# Patient Record
Sex: Male | Born: 1940 | Race: White | Hispanic: No | Marital: Married | Smoking: Former smoker
Health system: Southern US, Community
[De-identification: ages and names within clinical notes are randomized; demographics above are authoritative.]

## PROBLEM LIST (undated history)

## (undated) DIAGNOSIS — Z86711 Personal history of pulmonary embolism: Secondary | ICD-10-CM

## (undated) DIAGNOSIS — I10 Essential (primary) hypertension: Secondary | ICD-10-CM

## (undated) DIAGNOSIS — C649 Malignant neoplasm of unspecified kidney, except renal pelvis: Secondary | ICD-10-CM

## (undated) DIAGNOSIS — M1990 Unspecified osteoarthritis, unspecified site: Secondary | ICD-10-CM

## (undated) HISTORY — PX: APPENDECTOMY: SHX54

## (undated) HISTORY — DX: Unspecified osteoarthritis, unspecified site: M19.90

## (undated) HISTORY — PX: HERNIA REPAIR: SHX51

## (undated) HISTORY — PX: OTHER SURGICAL HISTORY: SHX169

---

## 1999-10-26 ENCOUNTER — Encounter: Payer: Self-pay | Admitting: General Surgery

## 1999-10-28 ENCOUNTER — Ambulatory Visit (HOSPITAL_COMMUNITY): Admission: RE | Admit: 1999-10-28 | Discharge: 1999-10-28 | Payer: Self-pay | Admitting: General Surgery

## 2004-07-31 ENCOUNTER — Observation Stay (HOSPITAL_COMMUNITY): Admission: AD | Admit: 2004-07-31 | Discharge: 2004-08-01 | Payer: Self-pay | Admitting: Surgery

## 2004-07-31 ENCOUNTER — Encounter: Payer: Self-pay | Admitting: Emergency Medicine

## 2004-08-02 ENCOUNTER — Ambulatory Visit (HOSPITAL_COMMUNITY): Admission: RE | Admit: 2004-08-02 | Discharge: 2004-08-02 | Payer: Self-pay | Admitting: Surgery

## 2004-08-05 ENCOUNTER — Ambulatory Visit (HOSPITAL_COMMUNITY): Admission: RE | Admit: 2004-08-05 | Discharge: 2004-08-06 | Payer: Self-pay | Admitting: Orthopaedic Surgery

## 2011-08-10 ENCOUNTER — Ambulatory Visit (INDEPENDENT_AMBULATORY_CARE_PROVIDER_SITE_OTHER): Payer: MEDICARE | Admitting: General Surgery

## 2011-08-10 ENCOUNTER — Encounter (INDEPENDENT_AMBULATORY_CARE_PROVIDER_SITE_OTHER): Payer: Self-pay | Admitting: General Surgery

## 2011-08-10 VITALS — BP 143/81 | HR 65 | Temp 98.0°F | Ht 69.0 in | Wt 198.6 lb

## 2011-08-10 DIAGNOSIS — K4090 Unilateral inguinal hernia, without obstruction or gangrene, not specified as recurrent: Secondary | ICD-10-CM | POA: Insufficient documentation

## 2011-08-10 NOTE — Patient Instructions (Signed)
Call us when you are on the lowest dose of Prednisone possible or you are off of it and then we can schedule your hernia surgery.

## 2011-08-10 NOTE — Progress Notes (Signed)
Patient ID: Devin [name], male   DOB: 1941/02/03, 71 y.o.   MRN: [id number]  Chief Complaint  Patient presents with  . Pre-op Exam    eval [facility]    HPI Devin [name] is a 71 y.o. male.   HPI  He is referred by Dr. Shaune Pollack for evaluation of a possible left inguinal hernia.  He noticed a left groin swelling about 6 months ago that has progressively increased and extended down into his scrotum. When he does heavy activities it is uncomfortable. He has no obstructing symptoms. No current difficulty with urination or constipation. I did a laparoscopic repair of a recurrent right inguinal hernia in 2001 and he had urinary retention after this. 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 No obstructive symptoms.  Past Medical History  Diagnosis Date  . Arthritis       Polymyalgia rheumatica, hypertension, gout, chronic kidney disease  Past Surgical History  Procedure Date  . Hernia repair 1985&2001  Appendectomy, right wrist surgery.  Family History  Problem Relation Age of Onset  . Cancer Mother     bone    Social History History  Substance Use Topics  . Smoking status: Former Games developer  . Smokeless tobacco: Former Neurosurgeon    Quit date: 08/09/1985  . Alcohol Use: No    Allergies  Allergen Reactions  . Zestril (Lisinopril)     gout     Current Outpatient Prescriptions  Medication Sig Dispense Refill  . allopurinol (ZYLOPRIM) 300 MG tablet       . amLODipine (NORVASC) 5 MG tablet       . furosemide (LASIX) 20 MG tablet       . HYDROcodone-acetaminophen (VICODIN) 5-500 MG per tablet       . labetalol (NORMODYNE) 300 MG tablet       . predniSONE (DELTASONE) 20 MG tablet         Review of Systems Review of Systems  Respiratory: Negative.   Cardiovascular: Negative.   Gastrointestinal: Negative.   Genitourinary: Negative for dysuria and difficulty urinating.  Musculoskeletal: Positive for arthralgias.  Neurological: Negative.     Hematological: Negative.     Blood pressure 143/81, pulse 65, temperature 98 F (36.7 C), temperature source Temporal, height 5\' 9"  (1.753 m), weight 198 lb 9.6 oz (90.084 kg), SpO2 98.00%.  Physical Exam Physical Exam  Constitutional:       Overweight male in NAD  HENT:  Head: Normocephalic and atraumatic.  Cardiovascular: Normal rate and regular rhythm.   Pulmonary/Chest: Effort normal and breath sounds normal.  Abdominal: Soft. He exhibits no mass. There is no tenderness.       RLQ scar.  Small subumbilical scars.  Genitourinary:       Right groin scar.  Large left inguinal bulge radiating down into scrotum that is partially reducible.    Data Reviewed Dr. Kevan Ny' note.  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    Plan    Open left inguinal hernia repair with mesh when his prednisone has been weaned down her off.  I have explained the procedure, risks, and aftercare of inguinal hernia repair.  Risks include but are not limited to bleeding, infection, wound problems, anesthesia, recurrence, bladder or intestine injury, urinary retention, testicular dysfunction, chronic pain, mesh problems.  He seems to understand and agrees with the plan.  Will await his phone call re his prednisone.  Irving Lubbers J 08/10/2011, 10:59 AM

## 2011-09-07 ENCOUNTER — Other Ambulatory Visit: Payer: Self-pay | Admitting: Rheumatology

## 2011-09-07 DIAGNOSIS — M064 Inflammatory polyarthropathy: Secondary | ICD-10-CM

## 2011-09-10 ENCOUNTER — Ambulatory Visit
Admission: RE | Admit: 2011-09-10 | Discharge: 2011-09-10 | Disposition: A | Discharge status: Home / Self Care | Payer: Self-pay | Source: Ambulatory Visit | Attending: Rheumatology | Admitting: Rheumatology

## 2011-09-10 DIAGNOSIS — M064 Inflammatory polyarthropathy: Secondary | ICD-10-CM

## 2014-03-04 ENCOUNTER — Encounter (HOSPITAL_COMMUNITY): Payer: Self-pay | Admitting: Emergency Medicine

## 2014-03-04 ENCOUNTER — Emergency Department (HOSPITAL_COMMUNITY)
Admission: EM | Admit: 2014-03-04 | Discharge: 2014-03-04 | Disposition: A | Discharge status: Home / Self Care | Payer: Medicare Other | Attending: Emergency Medicine | Admitting: Emergency Medicine

## 2014-03-04 DIAGNOSIS — Z87891 Personal history of nicotine dependence: Secondary | ICD-10-CM | POA: Diagnosis not present

## 2014-03-04 DIAGNOSIS — R55 Syncope and collapse: Secondary | ICD-10-CM | POA: Insufficient documentation

## 2014-03-04 DIAGNOSIS — I10 Essential (primary) hypertension: Secondary | ICD-10-CM | POA: Insufficient documentation

## 2014-03-04 DIAGNOSIS — Z79899 Other long term (current) drug therapy: Secondary | ICD-10-CM | POA: Diagnosis not present

## 2014-03-04 DIAGNOSIS — M1990 Unspecified osteoarthritis, unspecified site: Secondary | ICD-10-CM | POA: Diagnosis not present

## 2014-03-04 HISTORY — DX: Essential (primary) hypertension: I10

## 2014-03-04 LAB — CBC WITH DIFFERENTIAL/PLATELET
BASOS ABS: 0 10*3/uL (ref 0.0–0.1)
BASOS PCT: 0 % (ref 0–1)
EOS PCT: 1 % (ref 0–5)
Eosinophils Absolute: 0.1 10*3/uL (ref 0.0–0.7)
HEMATOCRIT: 35.5 % — AB (ref 39.0–52.0)
Hemoglobin: 11.9 g/dL — ABNORMAL LOW (ref 13.0–17.0)
LYMPHS PCT: 11 % — AB (ref 12–46)
Lymphs Abs: 0.8 10*3/uL (ref 0.7–4.0)
MCH: 32.2 pg (ref 26.0–34.0)
MCHC: 33.5 g/dL (ref 30.0–36.0)
MCV: 95.9 fL (ref 78.0–100.0)
MONO ABS: 0.3 10*3/uL (ref 0.1–1.0)
Monocytes Relative: 4 % (ref 3–12)
Neutro Abs: 5.6 10*3/uL (ref 1.7–7.7)
Neutrophils Relative %: 84 % — ABNORMAL HIGH (ref 43–77)
PLATELETS: 169 10*3/uL (ref 150–400)
RBC: 3.7 MIL/uL — ABNORMAL LOW (ref 4.22–5.81)
RDW: 14.2 % (ref 11.5–15.5)
WBC: 6.8 10*3/uL (ref 4.0–10.5)

## 2014-03-04 LAB — I-STAT TROPONIN, ED: TROPONIN I, POC: 0 ng/mL (ref 0.00–0.08)

## 2014-03-04 LAB — BASIC METABOLIC PANEL
ANION GAP: 13 (ref 5–15)
BUN: 20 mg/dL (ref 6–23)
CALCIUM: 8.5 mg/dL (ref 8.4–10.5)
CO2: 22 meq/L (ref 19–32)
Chloride: 102 mEq/L (ref 96–112)
Creatinine, Ser: 1.14 mg/dL (ref 0.50–1.35)
GFR calc non Af Amer: 62 mL/min — ABNORMAL LOW (ref >90)
GFR, EST AFRICAN AMERICAN: 72 mL/min — AB (ref >90)
Glucose, Bld: 203 mg/dL — ABNORMAL HIGH (ref 70–99)
Potassium: 3.9 mEq/L (ref 3.7–5.3)
SODIUM: 137 meq/L (ref 137–147)

## 2014-03-04 MED ORDER — SODIUM CHLORIDE 0.9 % IV BOLUS (SEPSIS)
1000.0000 mL | Freq: Once | INTRAVENOUS | Status: AC
  Administered 2014-03-04: 1000 mL via INTRAVENOUS

## 2014-03-04 NOTE — ED Notes (Signed)
Pt states he takes two labetalol in the morning and two at night. He took his two pills this morning, same dosage as usual, but it was from a different company. This was the first time he has taken this particular brand. Pt denies any chest pain. Pt is fully alert and oriented. States he feels better

## 2014-03-04 NOTE — Discharge Instructions (Signed)
Syncope °Syncope is a medical term for fainting or passing out. This means you lose consciousness and drop to the ground. People are generally unconscious for less than 5 minutes. You may have some muscle twitches for up to 15 seconds before waking up and returning to normal. Syncope occurs more often in older adults, but it can happen to anyone. While most causes of syncope are not dangerous, syncope can be a sign of a serious medical problem. It is important to seek medical care.  °CAUSES  °Syncope is caused by a sudden drop in blood flow to the brain. The specific cause is often not determined. Factors that can bring on syncope include: °· Taking medicines that lower blood pressure. °· Sudden changes in posture, such as standing up quickly. °· Taking more medicine than prescribed. °· Standing in one place for too long. °· Seizure disorders. °· Dehydration and excessive exposure to heat. °· Low blood sugar (hypoglycemia). °· Straining to have a bowel movement. °· Heart disease, irregular heartbeat, or other circulatory problems. °· Fear, emotional distress, seeing blood, or severe pain. °SYMPTOMS  °Right before fainting, you may: °· Feel dizzy or light-headed. °· Feel nauseous. °· See all white or all black in your field of vision. °· Have cold, clammy skin. °DIAGNOSIS  °Your health care provider will ask about your symptoms, perform a physical exam, and perform an electrocardiogram (ECG) to record the electrical activity of your heart. Your health care provider may also perform other heart or blood tests to determine the cause of your syncope which may include: °· Transthoracic echocardiogram (TTE). During echocardiography, sound waves are used to evaluate how blood flows through your heart. °· Transesophageal echocardiogram (TEE). °· Cardiac monitoring. This allows your health care provider to monitor your heart rate and rhythm in real time. °· Holter monitor. This is a portable device that records your  heartbeat and can help diagnose heart arrhythmias. It allows your health care provider to track your heart activity for several days, if needed. °· Stress tests by exercise or by giving medicine that makes the heart beat faster. °TREATMENT  °In most cases, no treatment is needed. Depending on the cause of your syncope, your health care provider may recommend changing or stopping some of your medicines. °HOME CARE INSTRUCTIONS °· Have someone stay with you until you feel stable. °· Do not drive, use machinery, or play sports until your health care provider says it is okay. °· Keep all follow-up appointments as directed by your health care provider. °· Lie down right away if you start feeling like you might faint. Breathe deeply and steadily. Wait until all the symptoms have passed. °· Drink enough fluids to keep your urine clear or pale yellow. °· If you are taking blood pressure or heart medicine, get up slowly and take several minutes to sit and then stand. This can reduce dizziness. °SEEK IMMEDIATE MEDICAL CARE IF:  °· You have a severe headache. °· You have unusual pain in the chest, abdomen, or back. °· You are bleeding from your mouth or rectum, or you have black or tarry stool. °· You have an irregular or very fast heartbeat. °· You have pain with breathing. °· You have repeated fainting or seizure-like jerking during an episode. °· You faint when sitting or lying down. °· You have confusion. °· You have trouble walking. °· You have severe weakness. °· You have vision problems. °If you fainted, call your local emergency services (911 in [place] Do not drive   yourself to the hospital.  °MAKE SURE YOU: °· Understand these instructions. °· Will watch your condition. °· Will get help right away if you are not doing well or get worse. °Document Released: 03/07/2005 Document Revised: 03/12/2013 Document Reviewed: 05/06/2011 °[employer]® Patient Information ©2015 [employer]. This information is not intended to replace  advice given to you by your health care provider. Make sure you discuss any questions you have with your health care provider. ° °

## 2014-03-04 NOTE — ED Provider Notes (Signed)
CSN: [id number]     Arrival date & time 03/04/14  1351 History   First MD Initiated Contact with Patient 03/04/14 1353     Chief Complaint  Patient presents with  . Loss of Consciousness     (Consider location/radiation/quality/duration/timing/severity/associated sxs/prior Treatment) HPI Comments: Patient with past medical history of hypertension presents to the emergency department with chief complaint of LOC. Patient states that he was eating lunch with some friends today when he began to feel lightheaded and passed out. He did not follow the ground. He was seated when he passed out. He denies any injury from passing out. He denies any associated chest pain, shortness of breath, numbness, weakness, or tingling. He states that he did have one episode of vomiting. Per EMS, the patient was found to have a blood pressure 90/60. He has been given fluids, and feels much better now. Patient states that he recently restarted taking labetalol. He states that he was out of his medication for a few days, and took too labetalol last night and 2 this morning. These are the first doses he has had in the past several days. Patient denies any symptoms at this time. States that he feels well. There are no aggravating or alleviating factors.  The history is provided by the patient. No language interpreter was used.    Past Medical History  Diagnosis Date  . Arthritis   . Hypertension    Past Surgical History  Procedure Laterality Date  . Hernia repair  1985&2001  . Appendectomy     Family History  Problem Relation Age of Onset  . Cancer Mother     bone   History  Substance Use Topics  . Smoking status: Former Research scientist (life sciences)  . Smokeless tobacco: Former Systems developer    Quit date: 08/09/1985  . Alcohol Use: No    Review of Systems  Constitutional: Negative for fever and chills.  Respiratory: Negative for shortness of breath.   Cardiovascular: Negative for chest pain.  Gastrointestinal: Negative for nausea,  vomiting, diarrhea and constipation.  Genitourinary: Negative for dysuria.  Neurological: Positive for syncope.  All other systems reviewed and are negative.     Allergies  Zestril  Home Medications   Prior to Admission medications   Medication Sig Start Date End Date Taking? Authorizing Provider  allopurinol (ZYLOPRIM) 300 MG tablet  07/29/11   Historical Provider, MD  amLODipine (NORVASC) 5 MG tablet  06/28/11   Historical Provider, MD  furosemide (LASIX) 20 MG tablet  07/25/11   Historical Provider, MD  HYDROcodone-acetaminophen (VICODIN) 5-500 MG per tablet  08/04/11   Historical Provider, MD  labetalol (NORMODYNE) 300 MG tablet  07/29/11   Historical Provider, MD  predniSONE (DELTASONE) 20 MG tablet  08/04/11   Historical Provider, MD   BP 117/67 mmHg  Pulse 61  Temp(Src) 97.8 F (36.6 C) (Oral)  Resp 21  Ht 5\' 10"  (1.778 m)  Wt 190 lb (86.183 kg)  BMI 27.26 kg/m2  SpO2 94% Physical Exam  Constitutional: He is oriented to person, place, and time. He appears well-developed and well-nourished.  HENT:  Head: Normocephalic and atraumatic.  Eyes: Conjunctivae and EOM are normal. Pupils are equal, round, and reactive to light. Right eye exhibits no discharge. Left eye exhibits no discharge. No scleral icterus.  Neck: Normal range of motion. Neck supple. No JVD present.  Cardiovascular: Normal rate, regular rhythm and normal heart sounds.  Exam reveals no gallop and no friction rub.   No murmur heard. Pulmonary/Chest: Effort  normal and breath sounds normal. No respiratory distress. He has no wheezes. He has no rales. He exhibits no tenderness.  Abdominal: Soft. He exhibits no distension and no mass. There is no tenderness. There is no rebound and no guarding.  Musculoskeletal: Normal range of motion. He exhibits no edema or tenderness.  Neurological: He is alert and oriented to person, place, and time.  Skin: Skin is warm and dry.  Psychiatric: He has a normal mood and affect. His  behavior is normal. Judgment and thought content normal.  Nursing note and vitals reviewed.   ED Course  Procedures (including critical care time) Results for orders placed or performed during the hospital encounter of 03/04/14  CBC with Differential  Result Value Ref Range   WBC 6.8 4.0 - 10.5 K/uL   RBC 3.70 (L) 4.22 - 5.81 MIL/uL   Hemoglobin 11.9 (L) 13.0 - 17.0 g/dL   HCT 35.5 (L) 39.0 - 52.0 %   MCV 95.9 78.0 - 100.0 fL   MCH 32.2 26.0 - 34.0 pg   MCHC 33.5 30.0 - 36.0 g/dL   RDW 14.2 11.5 - 15.5 %   Platelets 169 150 - 400 K/uL   Neutrophils Relative % 84 (H) 43 - 77 %   Neutro Abs 5.6 1.7 - 7.7 K/uL   Lymphocytes Relative 11 (L) 12 - 46 %   Lymphs Abs 0.8 0.7 - 4.0 K/uL   Monocytes Relative 4 3 - 12 %   Monocytes Absolute 0.3 0.1 - 1.0 K/uL   Eosinophils Relative 1 0 - 5 %   Eosinophils Absolute 0.1 0.0 - 0.7 K/uL   Basophils Relative 0 0 - 1 %   Basophils Absolute 0.0 0.0 - 0.1 K/uL  Basic metabolic panel  Result Value Ref Range   Sodium 137 137 - 147 mEq/L   Potassium 3.9 3.7 - 5.3 mEq/L   Chloride 102 96 - 112 mEq/L   CO2 22 19 - 32 mEq/L   Glucose, Bld 203 (H) 70 - 99 mg/dL   BUN 20 6 - 23 mg/dL   Creatinine, Ser 1.14 0.50 - 1.35 mg/dL   Calcium 8.5 8.4 - 10.5 mg/dL   GFR calc non Af Amer 62 (L) >90 mL/min   GFR calc Af Amer 72 (L) >90 mL/min   Anion gap 13 5 - 15  I-stat troponin, ED  Result Value Ref Range   Troponin i, poc 0.00 0.00 - 0.08 ng/mL   Comment 3           No results found. \  Imaging Review No results found.   EKG Interpretation None     ED ECG REPORT  I personally interpreted this EKG   Date: 03/04/2014   Rate: 61  Rhythm: normal sinus rhythm  QRS Axis: normal  Intervals: PR prolonged  ST/T Wave abnormalities: non-specific T-wave changes  Conduction Disutrbances:none  Narrative Interpretation:   Old EKG Reviewed: none available    MDM   Final diagnoses:  Syncope, unspecified syncope type    Patient with syncopal  episode. I suspect this medication related, with patient just restarted taking too labetalol last night. He also took 2 this morning. Patient reports lightheadedness prior to passing out. There is no injury from falling. Patient did not fall.  He states that he feels well now. Denies any chest pain or shortness breath. He appears well. Will check labs and orthostatics. EKG remarkable for nonspecific T wave changes with nothing to compare to.  No sign of  heart block or arrhythmia.  Labs are reassuring.  Patient workup discussed and reviewed with Dr. Roderic Palau, who recommends discharge with PCP follow-up this week.  Advised patient to take one tablet of labetalol instead of two until he sees his doctor.     Montine Circle, PA-C 03/04/14 [place], MD 03/05/14 [phone]

## 2014-03-04 NOTE — ED Notes (Signed)
Syncopal episode at [place] approx 1300. Pt had LOC for about 5-10 min. Did not hit head, did not fall. Pt was in the chair, appeared gray/ashy. Pt vomited as well. Pt has had an episode similar to this in the summer when he was dehydrated. BP 90/60 when EMS arrived, pt received 522mL and CBG 123, HR 60, resp 17. 2L [place] sats 99%.

## 2014-04-04 DIAGNOSIS — M170 Bilateral primary osteoarthritis of knee: Secondary | ICD-10-CM | POA: Diagnosis not present

## 2014-04-04 DIAGNOSIS — M2550 Pain in unspecified joint: Secondary | ICD-10-CM | POA: Diagnosis not present

## 2014-04-04 DIAGNOSIS — M0609 Rheumatoid arthritis without rheumatoid factor, multiple sites: Secondary | ICD-10-CM | POA: Diagnosis not present

## 2014-04-04 DIAGNOSIS — M1A09X0 Idiopathic chronic gout, multiple sites, without tophus (tophi): Secondary | ICD-10-CM | POA: Diagnosis not present

## 2014-04-04 DIAGNOSIS — Z79899 Other long term (current) drug therapy: Secondary | ICD-10-CM | POA: Diagnosis not present

## 2014-05-12 DIAGNOSIS — K4090 Unilateral inguinal hernia, without obstruction or gangrene, not specified as recurrent: Secondary | ICD-10-CM | POA: Diagnosis not present

## 2014-05-22 DIAGNOSIS — I10 Essential (primary) hypertension: Secondary | ICD-10-CM | POA: Diagnosis not present

## 2014-06-09 DIAGNOSIS — M069 Rheumatoid arthritis, unspecified: Secondary | ICD-10-CM | POA: Diagnosis not present

## 2014-06-09 DIAGNOSIS — E785 Hyperlipidemia, unspecified: Secondary | ICD-10-CM | POA: Diagnosis not present

## 2014-06-09 DIAGNOSIS — Z888 Allergy status to other drugs, medicaments and biological substances status: Secondary | ICD-10-CM | POA: Diagnosis not present

## 2014-06-09 DIAGNOSIS — Z791 Long term (current) use of non-steroidal anti-inflammatories (NSAID): Secondary | ICD-10-CM | POA: Diagnosis not present

## 2014-06-09 DIAGNOSIS — I10 Essential (primary) hypertension: Secondary | ICD-10-CM | POA: Diagnosis not present

## 2014-06-09 DIAGNOSIS — Z79899 Other long term (current) drug therapy: Secondary | ICD-10-CM | POA: Diagnosis not present

## 2014-06-09 DIAGNOSIS — Z6829 Body mass index (BMI) 29.0-29.9, adult: Secondary | ICD-10-CM | POA: Diagnosis not present

## 2014-06-09 DIAGNOSIS — K4090 Unilateral inguinal hernia, without obstruction or gangrene, not specified as recurrent: Secondary | ICD-10-CM | POA: Diagnosis not present

## 2014-06-09 DIAGNOSIS — Z87891 Personal history of nicotine dependence: Secondary | ICD-10-CM | POA: Diagnosis not present

## 2014-06-09 DIAGNOSIS — K4030 Unilateral inguinal hernia, with obstruction, without gangrene, not specified as recurrent: Secondary | ICD-10-CM | POA: Diagnosis not present

## 2014-06-10 DIAGNOSIS — Z888 Allergy status to other drugs, medicaments and biological substances status: Secondary | ICD-10-CM | POA: Diagnosis not present

## 2014-06-10 DIAGNOSIS — K4090 Unilateral inguinal hernia, without obstruction or gangrene, not specified as recurrent: Secondary | ICD-10-CM | POA: Diagnosis not present

## 2014-06-10 DIAGNOSIS — E785 Hyperlipidemia, unspecified: Secondary | ICD-10-CM | POA: Diagnosis not present

## 2014-06-10 DIAGNOSIS — M069 Rheumatoid arthritis, unspecified: Secondary | ICD-10-CM | POA: Diagnosis not present

## 2014-06-10 DIAGNOSIS — Z6829 Body mass index (BMI) 29.0-29.9, adult: Secondary | ICD-10-CM | POA: Diagnosis not present

## 2014-06-10 DIAGNOSIS — Z791 Long term (current) use of non-steroidal anti-inflammatories (NSAID): Secondary | ICD-10-CM | POA: Diagnosis not present

## 2014-06-10 DIAGNOSIS — Z79899 Other long term (current) drug therapy: Secondary | ICD-10-CM | POA: Diagnosis not present

## 2014-06-10 DIAGNOSIS — Z87891 Personal history of nicotine dependence: Secondary | ICD-10-CM | POA: Diagnosis not present

## 2014-06-10 DIAGNOSIS — I10 Essential (primary) hypertension: Secondary | ICD-10-CM | POA: Diagnosis not present

## 2014-07-08 DIAGNOSIS — M0609 Rheumatoid arthritis without rheumatoid factor, multiple sites: Secondary | ICD-10-CM | POA: Diagnosis not present

## 2014-07-08 DIAGNOSIS — M2550 Pain in unspecified joint: Secondary | ICD-10-CM | POA: Diagnosis not present

## 2014-07-08 DIAGNOSIS — Z79899 Other long term (current) drug therapy: Secondary | ICD-10-CM | POA: Diagnosis not present

## 2014-07-08 DIAGNOSIS — M1A09X0 Idiopathic chronic gout, multiple sites, without tophus (tophi): Secondary | ICD-10-CM | POA: Diagnosis not present

## 2014-07-08 DIAGNOSIS — M170 Bilateral primary osteoarthritis of knee: Secondary | ICD-10-CM | POA: Diagnosis not present

## 2014-08-12 DIAGNOSIS — D2311 Other benign neoplasm of skin of right eyelid, including canthus: Secondary | ICD-10-CM | POA: Diagnosis not present

## 2014-08-12 DIAGNOSIS — H35373 Puckering of macula, bilateral: Secondary | ICD-10-CM | POA: Diagnosis not present

## 2014-08-12 DIAGNOSIS — H43811 Vitreous degeneration, right eye: Secondary | ICD-10-CM | POA: Diagnosis not present

## 2014-08-12 DIAGNOSIS — H25813 Combined forms of age-related cataract, bilateral: Secondary | ICD-10-CM | POA: Diagnosis not present

## 2014-08-12 DIAGNOSIS — H527 Unspecified disorder of refraction: Secondary | ICD-10-CM | POA: Diagnosis not present

## 2014-08-13 DIAGNOSIS — M25561 Pain in right knee: Secondary | ICD-10-CM | POA: Diagnosis not present

## 2014-08-27 DIAGNOSIS — E669 Obesity, unspecified: Secondary | ICD-10-CM | POA: Diagnosis not present

## 2014-08-27 DIAGNOSIS — E780 Pure hypercholesterolemia: Secondary | ICD-10-CM | POA: Diagnosis not present

## 2014-08-27 DIAGNOSIS — Z0000 Encounter for general adult medical examination without abnormal findings: Secondary | ICD-10-CM | POA: Diagnosis not present

## 2014-08-27 DIAGNOSIS — M1009 Idiopathic gout, multiple sites: Secondary | ICD-10-CM | POA: Diagnosis not present

## 2014-08-27 DIAGNOSIS — N183 Chronic kidney disease, stage 3 (moderate): Secondary | ICD-10-CM | POA: Diagnosis not present

## 2014-08-27 DIAGNOSIS — I10 Essential (primary) hypertension: Secondary | ICD-10-CM | POA: Diagnosis not present

## 2014-08-27 DIAGNOSIS — R7309 Other abnormal glucose: Secondary | ICD-10-CM | POA: Diagnosis not present

## 2014-08-27 DIAGNOSIS — E559 Vitamin D deficiency, unspecified: Secondary | ICD-10-CM | POA: Diagnosis not present

## 2014-10-07 DIAGNOSIS — M0609 Rheumatoid arthritis without rheumatoid factor, multiple sites: Secondary | ICD-10-CM | POA: Diagnosis not present

## 2014-10-07 DIAGNOSIS — Z79899 Other long term (current) drug therapy: Secondary | ICD-10-CM | POA: Diagnosis not present

## 2014-10-07 DIAGNOSIS — M1A09X0 Idiopathic chronic gout, multiple sites, without tophus (tophi): Secondary | ICD-10-CM | POA: Diagnosis not present

## 2014-10-07 DIAGNOSIS — M2550 Pain in unspecified joint: Secondary | ICD-10-CM | POA: Diagnosis not present

## 2014-10-07 DIAGNOSIS — M170 Bilateral primary osteoarthritis of knee: Secondary | ICD-10-CM | POA: Diagnosis not present

## 2014-10-09 DIAGNOSIS — I10 Essential (primary) hypertension: Secondary | ICD-10-CM | POA: Diagnosis not present

## 2014-10-09 DIAGNOSIS — R609 Edema, unspecified: Secondary | ICD-10-CM | POA: Diagnosis not present

## 2014-12-24 DIAGNOSIS — Z79899 Other long term (current) drug therapy: Secondary | ICD-10-CM | POA: Diagnosis not present

## 2014-12-24 DIAGNOSIS — M170 Bilateral primary osteoarthritis of knee: Secondary | ICD-10-CM | POA: Diagnosis not present

## 2014-12-24 DIAGNOSIS — M1009 Idiopathic gout, multiple sites: Secondary | ICD-10-CM | POA: Diagnosis not present

## 2014-12-24 DIAGNOSIS — M25561 Pain in right knee: Secondary | ICD-10-CM | POA: Diagnosis not present

## 2014-12-24 DIAGNOSIS — M0609 Rheumatoid arthritis without rheumatoid factor, multiple sites: Secondary | ICD-10-CM | POA: Diagnosis not present

## 2015-01-09 DIAGNOSIS — Z23 Encounter for immunization: Secondary | ICD-10-CM | POA: Diagnosis not present

## 2015-02-26 DIAGNOSIS — I10 Essential (primary) hypertension: Secondary | ICD-10-CM | POA: Diagnosis not present

## 2015-02-26 DIAGNOSIS — E7800 Pure hypercholesterolemia, unspecified: Secondary | ICD-10-CM | POA: Diagnosis not present

## 2015-02-26 DIAGNOSIS — M1009 Idiopathic gout, multiple sites: Secondary | ICD-10-CM | POA: Diagnosis not present

## 2015-02-26 DIAGNOSIS — E559 Vitamin D deficiency, unspecified: Secondary | ICD-10-CM | POA: Diagnosis not present

## 2015-02-26 DIAGNOSIS — R7309 Other abnormal glucose: Secondary | ICD-10-CM | POA: Diagnosis not present

## 2015-03-26 DIAGNOSIS — M0609 Rheumatoid arthritis without rheumatoid factor, multiple sites: Secondary | ICD-10-CM | POA: Diagnosis not present

## 2015-03-26 DIAGNOSIS — Z79899 Other long term (current) drug therapy: Secondary | ICD-10-CM | POA: Diagnosis not present

## 2015-03-26 DIAGNOSIS — M1009 Idiopathic gout, multiple sites: Secondary | ICD-10-CM | POA: Diagnosis not present

## 2015-03-26 DIAGNOSIS — M170 Bilateral primary osteoarthritis of knee: Secondary | ICD-10-CM | POA: Diagnosis not present

## 2015-06-24 DIAGNOSIS — B354 Tinea corporis: Secondary | ICD-10-CM | POA: Diagnosis not present

## 2015-06-24 DIAGNOSIS — Z79899 Other long term (current) drug therapy: Secondary | ICD-10-CM | POA: Diagnosis not present

## 2015-06-24 DIAGNOSIS — M0609 Rheumatoid arthritis without rheumatoid factor, multiple sites: Secondary | ICD-10-CM | POA: Diagnosis not present

## 2015-06-24 DIAGNOSIS — M25561 Pain in right knee: Secondary | ICD-10-CM | POA: Diagnosis not present

## 2015-06-24 DIAGNOSIS — M170 Bilateral primary osteoarthritis of knee: Secondary | ICD-10-CM | POA: Diagnosis not present

## 2015-06-24 DIAGNOSIS — M1009 Idiopathic gout, multiple sites: Secondary | ICD-10-CM | POA: Diagnosis not present

## 2015-09-02 DIAGNOSIS — R7309 Other abnormal glucose: Secondary | ICD-10-CM | POA: Diagnosis not present

## 2015-09-02 DIAGNOSIS — Z125 Encounter for screening for malignant neoplasm of prostate: Secondary | ICD-10-CM | POA: Diagnosis not present

## 2015-09-02 DIAGNOSIS — M0609 Rheumatoid arthritis without rheumatoid factor, multiple sites: Secondary | ICD-10-CM | POA: Diagnosis not present

## 2015-09-02 DIAGNOSIS — E559 Vitamin D deficiency, unspecified: Secondary | ICD-10-CM | POA: Diagnosis not present

## 2015-09-02 DIAGNOSIS — M1009 Idiopathic gout, multiple sites: Secondary | ICD-10-CM | POA: Diagnosis not present

## 2015-09-02 DIAGNOSIS — E7800 Pure hypercholesterolemia, unspecified: Secondary | ICD-10-CM | POA: Diagnosis not present

## 2015-09-02 DIAGNOSIS — I10 Essential (primary) hypertension: Secondary | ICD-10-CM | POA: Diagnosis not present

## 2015-09-02 DIAGNOSIS — Z0000 Encounter for general adult medical examination without abnormal findings: Secondary | ICD-10-CM | POA: Diagnosis not present

## 2015-09-02 DIAGNOSIS — E669 Obesity, unspecified: Secondary | ICD-10-CM | POA: Diagnosis not present

## 2015-09-30 DIAGNOSIS — B354 Tinea corporis: Secondary | ICD-10-CM | POA: Diagnosis not present

## 2015-09-30 DIAGNOSIS — M65311 Trigger thumb, right thumb: Secondary | ICD-10-CM | POA: Diagnosis not present

## 2015-09-30 DIAGNOSIS — M25561 Pain in right knee: Secondary | ICD-10-CM | POA: Diagnosis not present

## 2015-09-30 DIAGNOSIS — M1009 Idiopathic gout, multiple sites: Secondary | ICD-10-CM | POA: Diagnosis not present

## 2015-09-30 DIAGNOSIS — M170 Bilateral primary osteoarthritis of knee: Secondary | ICD-10-CM | POA: Diagnosis not present

## 2015-09-30 DIAGNOSIS — M0609 Rheumatoid arthritis without rheumatoid factor, multiple sites: Secondary | ICD-10-CM | POA: Diagnosis not present

## 2015-09-30 DIAGNOSIS — Z79899 Other long term (current) drug therapy: Secondary | ICD-10-CM | POA: Diagnosis not present

## 2015-11-13 DIAGNOSIS — N183 Chronic kidney disease, stage 3 (moderate): Secondary | ICD-10-CM | POA: Diagnosis not present

## 2015-11-13 DIAGNOSIS — R972 Elevated prostate specific antigen [PSA]: Secondary | ICD-10-CM | POA: Diagnosis not present

## 2016-01-11 DIAGNOSIS — M25561 Pain in right knee: Secondary | ICD-10-CM | POA: Diagnosis not present

## 2016-01-11 DIAGNOSIS — M170 Bilateral primary osteoarthritis of knee: Secondary | ICD-10-CM | POA: Diagnosis not present

## 2016-01-11 DIAGNOSIS — M1009 Idiopathic gout, multiple sites: Secondary | ICD-10-CM | POA: Diagnosis not present

## 2016-01-11 DIAGNOSIS — Z79899 Other long term (current) drug therapy: Secondary | ICD-10-CM | POA: Diagnosis not present

## 2016-01-11 DIAGNOSIS — M0609 Rheumatoid arthritis without rheumatoid factor, multiple sites: Secondary | ICD-10-CM | POA: Diagnosis not present

## 2016-01-12 DIAGNOSIS — Z23 Encounter for immunization: Secondary | ICD-10-CM | POA: Diagnosis not present

## 2016-03-07 ENCOUNTER — Ambulatory Visit
Admission: RE | Admit: 2016-03-07 | Discharge: 2016-03-07 | Disposition: A | Discharge status: Home / Self Care | Payer: Commercial Managed Care - HMO | Source: Ambulatory Visit | Attending: Family Medicine | Admitting: Family Medicine

## 2016-03-07 ENCOUNTER — Other Ambulatory Visit: Payer: Self-pay | Admitting: Family Medicine

## 2016-03-07 DIAGNOSIS — M25561 Pain in right knee: Secondary | ICD-10-CM

## 2016-03-07 DIAGNOSIS — M25451 Effusion, right hip: Secondary | ICD-10-CM

## 2016-03-07 DIAGNOSIS — E7800 Pure hypercholesterolemia, unspecified: Secondary | ICD-10-CM | POA: Diagnosis not present

## 2016-03-07 DIAGNOSIS — M1009 Idiopathic gout, multiple sites: Secondary | ICD-10-CM | POA: Diagnosis not present

## 2016-03-07 DIAGNOSIS — R7303 Prediabetes: Secondary | ICD-10-CM | POA: Diagnosis not present

## 2016-03-07 DIAGNOSIS — M25461 Effusion, right knee: Secondary | ICD-10-CM

## 2016-03-07 DIAGNOSIS — M79651 Pain in right thigh: Secondary | ICD-10-CM

## 2016-03-07 DIAGNOSIS — I10 Essential (primary) hypertension: Secondary | ICD-10-CM | POA: Diagnosis not present

## 2016-03-07 DIAGNOSIS — M179 Osteoarthritis of knee, unspecified: Secondary | ICD-10-CM | POA: Diagnosis not present

## 2016-03-21 HISTORY — PX: DENTAL SURGERY: SHX609

## 2016-04-04 DIAGNOSIS — M25561 Pain in right knee: Secondary | ICD-10-CM | POA: Diagnosis not present

## 2016-04-04 DIAGNOSIS — M1711 Unilateral primary osteoarthritis, right knee: Secondary | ICD-10-CM | POA: Diagnosis not present

## 2016-04-18 DIAGNOSIS — Z79899 Other long term (current) drug therapy: Secondary | ICD-10-CM | POA: Diagnosis not present

## 2016-04-18 DIAGNOSIS — M65332 Trigger finger, left middle finger: Secondary | ICD-10-CM | POA: Diagnosis not present

## 2016-04-18 DIAGNOSIS — M170 Bilateral primary osteoarthritis of knee: Secondary | ICD-10-CM | POA: Diagnosis not present

## 2016-04-18 DIAGNOSIS — M0609 Rheumatoid arthritis without rheumatoid factor, multiple sites: Secondary | ICD-10-CM | POA: Diagnosis not present

## 2016-04-18 DIAGNOSIS — E663 Overweight: Secondary | ICD-10-CM | POA: Diagnosis not present

## 2016-04-18 DIAGNOSIS — M1009 Idiopathic gout, multiple sites: Secondary | ICD-10-CM | POA: Diagnosis not present

## 2016-04-18 DIAGNOSIS — M25561 Pain in right knee: Secondary | ICD-10-CM | POA: Diagnosis not present

## 2016-04-18 DIAGNOSIS — Z6827 Body mass index (BMI) 27.0-27.9, adult: Secondary | ICD-10-CM | POA: Diagnosis not present

## 2016-04-20 DIAGNOSIS — M1711 Unilateral primary osteoarthritis, right knee: Secondary | ICD-10-CM | POA: Insufficient documentation

## 2016-04-20 DIAGNOSIS — M069 Rheumatoid arthritis, unspecified: Secondary | ICD-10-CM | POA: Insufficient documentation

## 2016-04-21 DIAGNOSIS — Z86711 Personal history of pulmonary embolism: Secondary | ICD-10-CM

## 2016-04-21 HISTORY — PX: OTHER SURGICAL HISTORY: SHX169

## 2016-04-21 HISTORY — DX: Personal history of pulmonary embolism: Z86.711

## 2016-04-22 DIAGNOSIS — R55 Syncope and collapse: Secondary | ICD-10-CM | POA: Diagnosis not present

## 2016-04-22 DIAGNOSIS — Z87891 Personal history of nicotine dependence: Secondary | ICD-10-CM | POA: Diagnosis not present

## 2016-04-22 DIAGNOSIS — M1990 Unspecified osteoarthritis, unspecified site: Secondary | ICD-10-CM | POA: Diagnosis not present

## 2016-04-22 DIAGNOSIS — R4182 Altered mental status, unspecified: Secondary | ICD-10-CM | POA: Diagnosis not present

## 2016-04-22 DIAGNOSIS — I10 Essential (primary) hypertension: Secondary | ICD-10-CM | POA: Diagnosis not present

## 2016-04-22 DIAGNOSIS — E785 Hyperlipidemia, unspecified: Secondary | ICD-10-CM | POA: Diagnosis not present

## 2016-04-22 DIAGNOSIS — R110 Nausea: Secondary | ICD-10-CM | POA: Diagnosis not present

## 2016-04-22 DIAGNOSIS — Z9089 Acquired absence of other organs: Secondary | ICD-10-CM | POA: Diagnosis not present

## 2016-04-22 DIAGNOSIS — J9811 Atelectasis: Secondary | ICD-10-CM | POA: Diagnosis not present

## 2016-04-22 DIAGNOSIS — Z9852 Vasectomy status: Secondary | ICD-10-CM | POA: Diagnosis not present

## 2016-04-22 DIAGNOSIS — Z01818 Encounter for other preprocedural examination: Secondary | ICD-10-CM | POA: Diagnosis not present

## 2016-04-22 DIAGNOSIS — Z888 Allergy status to other drugs, medicaments and biological substances status: Secondary | ICD-10-CM | POA: Diagnosis not present

## 2016-04-22 DIAGNOSIS — M109 Gout, unspecified: Secondary | ICD-10-CM | POA: Diagnosis not present

## 2016-04-26 DIAGNOSIS — R55 Syncope and collapse: Secondary | ICD-10-CM | POA: Diagnosis not present

## 2016-04-26 DIAGNOSIS — I10 Essential (primary) hypertension: Secondary | ICD-10-CM | POA: Diagnosis not present

## 2016-04-29 DIAGNOSIS — I77810 Thoracic aortic ectasia: Secondary | ICD-10-CM | POA: Diagnosis not present

## 2016-04-29 DIAGNOSIS — I351 Nonrheumatic aortic (valve) insufficiency: Secondary | ICD-10-CM | POA: Diagnosis not present

## 2016-04-29 DIAGNOSIS — I358 Other nonrheumatic aortic valve disorders: Secondary | ICD-10-CM | POA: Diagnosis not present

## 2016-04-29 DIAGNOSIS — I519 Heart disease, unspecified: Secondary | ICD-10-CM | POA: Diagnosis not present

## 2016-04-29 DIAGNOSIS — I517 Cardiomegaly: Secondary | ICD-10-CM | POA: Diagnosis not present

## 2016-05-03 DIAGNOSIS — Z87891 Personal history of nicotine dependence: Secondary | ICD-10-CM | POA: Diagnosis not present

## 2016-05-03 DIAGNOSIS — J984 Other disorders of lung: Secondary | ICD-10-CM | POA: Diagnosis not present

## 2016-05-03 DIAGNOSIS — R410 Disorientation, unspecified: Secondary | ICD-10-CM | POA: Diagnosis not present

## 2016-05-03 DIAGNOSIS — Z96649 Presence of unspecified artificial hip joint: Secondary | ICD-10-CM | POA: Diagnosis not present

## 2016-05-03 DIAGNOSIS — I10 Essential (primary) hypertension: Secondary | ICD-10-CM | POA: Diagnosis not present

## 2016-05-03 DIAGNOSIS — R918 Other nonspecific abnormal finding of lung field: Secondary | ICD-10-CM | POA: Diagnosis not present

## 2016-05-03 DIAGNOSIS — Z471 Aftercare following joint replacement surgery: Secondary | ICD-10-CM | POA: Diagnosis not present

## 2016-05-03 DIAGNOSIS — Z888 Allergy status to other drugs, medicaments and biological substances status: Secondary | ICD-10-CM | POA: Diagnosis not present

## 2016-05-03 DIAGNOSIS — M069 Rheumatoid arthritis, unspecified: Secondary | ICD-10-CM | POA: Diagnosis not present

## 2016-05-03 DIAGNOSIS — R4182 Altered mental status, unspecified: Secondary | ICD-10-CM | POA: Diagnosis not present

## 2016-05-03 DIAGNOSIS — I2699 Other pulmonary embolism without acute cor pulmonale: Secondary | ICD-10-CM | POA: Diagnosis not present

## 2016-05-03 DIAGNOSIS — M1711 Unilateral primary osteoarthritis, right knee: Secondary | ICD-10-CM | POA: Diagnosis not present

## 2016-05-03 DIAGNOSIS — Z96651 Presence of right artificial knee joint: Secondary | ICD-10-CM | POA: Diagnosis not present

## 2016-05-03 DIAGNOSIS — S8991XA Unspecified injury of right lower leg, initial encounter: Secondary | ICD-10-CM | POA: Diagnosis not present

## 2016-05-03 DIAGNOSIS — S0990XA Unspecified injury of head, initial encounter: Secondary | ICD-10-CM | POA: Diagnosis not present

## 2016-05-05 DIAGNOSIS — I2699 Other pulmonary embolism without acute cor pulmonale: Secondary | ICD-10-CM | POA: Insufficient documentation

## 2016-05-05 DIAGNOSIS — R410 Disorientation, unspecified: Secondary | ICD-10-CM | POA: Insufficient documentation

## 2016-05-06 DIAGNOSIS — N289 Disorder of kidney and ureter, unspecified: Secondary | ICD-10-CM | POA: Insufficient documentation

## 2016-05-07 DIAGNOSIS — M069 Rheumatoid arthritis, unspecified: Secondary | ICD-10-CM | POA: Diagnosis not present

## 2016-05-07 DIAGNOSIS — E663 Overweight: Secondary | ICD-10-CM | POA: Diagnosis not present

## 2016-05-07 DIAGNOSIS — Z96651 Presence of right artificial knee joint: Secondary | ICD-10-CM | POA: Diagnosis not present

## 2016-05-07 DIAGNOSIS — Z471 Aftercare following joint replacement surgery: Secondary | ICD-10-CM | POA: Diagnosis not present

## 2016-05-07 DIAGNOSIS — M109 Gout, unspecified: Secondary | ICD-10-CM | POA: Diagnosis not present

## 2016-05-07 DIAGNOSIS — I10 Essential (primary) hypertension: Secondary | ICD-10-CM | POA: Diagnosis not present

## 2016-05-09 DIAGNOSIS — E663 Overweight: Secondary | ICD-10-CM | POA: Diagnosis not present

## 2016-05-09 DIAGNOSIS — Z96651 Presence of right artificial knee joint: Secondary | ICD-10-CM | POA: Diagnosis not present

## 2016-05-09 DIAGNOSIS — M109 Gout, unspecified: Secondary | ICD-10-CM | POA: Diagnosis not present

## 2016-05-09 DIAGNOSIS — I10 Essential (primary) hypertension: Secondary | ICD-10-CM | POA: Diagnosis not present

## 2016-05-09 DIAGNOSIS — M069 Rheumatoid arthritis, unspecified: Secondary | ICD-10-CM | POA: Diagnosis not present

## 2016-05-09 DIAGNOSIS — Z471 Aftercare following joint replacement surgery: Secondary | ICD-10-CM | POA: Diagnosis not present

## 2016-05-10 DIAGNOSIS — Z96651 Presence of right artificial knee joint: Secondary | ICD-10-CM | POA: Diagnosis not present

## 2016-05-10 DIAGNOSIS — M069 Rheumatoid arthritis, unspecified: Secondary | ICD-10-CM | POA: Diagnosis not present

## 2016-05-10 DIAGNOSIS — I10 Essential (primary) hypertension: Secondary | ICD-10-CM | POA: Diagnosis not present

## 2016-05-10 DIAGNOSIS — M109 Gout, unspecified: Secondary | ICD-10-CM | POA: Diagnosis not present

## 2016-05-10 DIAGNOSIS — E663 Overweight: Secondary | ICD-10-CM | POA: Diagnosis not present

## 2016-05-10 DIAGNOSIS — Z471 Aftercare following joint replacement surgery: Secondary | ICD-10-CM | POA: Diagnosis not present

## 2016-05-11 DIAGNOSIS — M109 Gout, unspecified: Secondary | ICD-10-CM | POA: Diagnosis not present

## 2016-05-11 DIAGNOSIS — Z96651 Presence of right artificial knee joint: Secondary | ICD-10-CM | POA: Diagnosis not present

## 2016-05-11 DIAGNOSIS — Z471 Aftercare following joint replacement surgery: Secondary | ICD-10-CM | POA: Diagnosis not present

## 2016-05-11 DIAGNOSIS — M069 Rheumatoid arthritis, unspecified: Secondary | ICD-10-CM | POA: Diagnosis not present

## 2016-05-11 DIAGNOSIS — E663 Overweight: Secondary | ICD-10-CM | POA: Diagnosis not present

## 2016-05-11 DIAGNOSIS — I10 Essential (primary) hypertension: Secondary | ICD-10-CM | POA: Diagnosis not present

## 2016-05-12 DIAGNOSIS — M109 Gout, unspecified: Secondary | ICD-10-CM | POA: Diagnosis not present

## 2016-05-12 DIAGNOSIS — E663 Overweight: Secondary | ICD-10-CM | POA: Diagnosis not present

## 2016-05-12 DIAGNOSIS — Z96651 Presence of right artificial knee joint: Secondary | ICD-10-CM | POA: Diagnosis not present

## 2016-05-12 DIAGNOSIS — M069 Rheumatoid arthritis, unspecified: Secondary | ICD-10-CM | POA: Diagnosis not present

## 2016-05-12 DIAGNOSIS — Z471 Aftercare following joint replacement surgery: Secondary | ICD-10-CM | POA: Diagnosis not present

## 2016-05-12 DIAGNOSIS — I10 Essential (primary) hypertension: Secondary | ICD-10-CM | POA: Diagnosis not present

## 2016-05-16 DIAGNOSIS — M109 Gout, unspecified: Secondary | ICD-10-CM | POA: Diagnosis not present

## 2016-05-16 DIAGNOSIS — Z96651 Presence of right artificial knee joint: Secondary | ICD-10-CM | POA: Diagnosis not present

## 2016-05-16 DIAGNOSIS — M069 Rheumatoid arthritis, unspecified: Secondary | ICD-10-CM | POA: Diagnosis not present

## 2016-05-16 DIAGNOSIS — I10 Essential (primary) hypertension: Secondary | ICD-10-CM | POA: Diagnosis not present

## 2016-05-16 DIAGNOSIS — M25561 Pain in right knee: Secondary | ICD-10-CM | POA: Diagnosis not present

## 2016-05-16 DIAGNOSIS — E663 Overweight: Secondary | ICD-10-CM | POA: Diagnosis not present

## 2016-05-16 DIAGNOSIS — Z471 Aftercare following joint replacement surgery: Secondary | ICD-10-CM | POA: Diagnosis not present

## 2016-05-17 DIAGNOSIS — M069 Rheumatoid arthritis, unspecified: Secondary | ICD-10-CM | POA: Diagnosis not present

## 2016-05-17 DIAGNOSIS — E663 Overweight: Secondary | ICD-10-CM | POA: Diagnosis not present

## 2016-05-17 DIAGNOSIS — M109 Gout, unspecified: Secondary | ICD-10-CM | POA: Diagnosis not present

## 2016-05-17 DIAGNOSIS — Z471 Aftercare following joint replacement surgery: Secondary | ICD-10-CM | POA: Diagnosis not present

## 2016-05-17 DIAGNOSIS — Z96651 Presence of right artificial knee joint: Secondary | ICD-10-CM | POA: Diagnosis not present

## 2016-05-17 DIAGNOSIS — I10 Essential (primary) hypertension: Secondary | ICD-10-CM | POA: Diagnosis not present

## 2016-05-18 DIAGNOSIS — M109 Gout, unspecified: Secondary | ICD-10-CM | POA: Diagnosis not present

## 2016-05-18 DIAGNOSIS — Z471 Aftercare following joint replacement surgery: Secondary | ICD-10-CM | POA: Diagnosis not present

## 2016-05-18 DIAGNOSIS — M069 Rheumatoid arthritis, unspecified: Secondary | ICD-10-CM | POA: Diagnosis not present

## 2016-05-18 DIAGNOSIS — E663 Overweight: Secondary | ICD-10-CM | POA: Diagnosis not present

## 2016-05-18 DIAGNOSIS — Z96651 Presence of right artificial knee joint: Secondary | ICD-10-CM | POA: Diagnosis not present

## 2016-05-18 DIAGNOSIS — I10 Essential (primary) hypertension: Secondary | ICD-10-CM | POA: Diagnosis not present

## 2016-05-19 DIAGNOSIS — E663 Overweight: Secondary | ICD-10-CM | POA: Diagnosis not present

## 2016-05-19 DIAGNOSIS — M109 Gout, unspecified: Secondary | ICD-10-CM | POA: Diagnosis not present

## 2016-05-19 DIAGNOSIS — Z471 Aftercare following joint replacement surgery: Secondary | ICD-10-CM | POA: Diagnosis not present

## 2016-05-19 DIAGNOSIS — M069 Rheumatoid arthritis, unspecified: Secondary | ICD-10-CM | POA: Diagnosis not present

## 2016-05-19 DIAGNOSIS — Z96651 Presence of right artificial knee joint: Secondary | ICD-10-CM | POA: Diagnosis not present

## 2016-05-19 DIAGNOSIS — I10 Essential (primary) hypertension: Secondary | ICD-10-CM | POA: Diagnosis not present

## 2016-05-20 DIAGNOSIS — E663 Overweight: Secondary | ICD-10-CM | POA: Diagnosis not present

## 2016-05-20 DIAGNOSIS — M109 Gout, unspecified: Secondary | ICD-10-CM | POA: Diagnosis not present

## 2016-05-20 DIAGNOSIS — Z471 Aftercare following joint replacement surgery: Secondary | ICD-10-CM | POA: Diagnosis not present

## 2016-05-20 DIAGNOSIS — Z96651 Presence of right artificial knee joint: Secondary | ICD-10-CM | POA: Diagnosis not present

## 2016-05-20 DIAGNOSIS — I10 Essential (primary) hypertension: Secondary | ICD-10-CM | POA: Diagnosis not present

## 2016-05-20 DIAGNOSIS — M069 Rheumatoid arthritis, unspecified: Secondary | ICD-10-CM | POA: Diagnosis not present

## 2016-05-23 DIAGNOSIS — Z471 Aftercare following joint replacement surgery: Secondary | ICD-10-CM | POA: Diagnosis not present

## 2016-05-23 DIAGNOSIS — M109 Gout, unspecified: Secondary | ICD-10-CM | POA: Diagnosis not present

## 2016-05-23 DIAGNOSIS — I10 Essential (primary) hypertension: Secondary | ICD-10-CM | POA: Diagnosis not present

## 2016-05-23 DIAGNOSIS — E663 Overweight: Secondary | ICD-10-CM | POA: Diagnosis not present

## 2016-05-23 DIAGNOSIS — M069 Rheumatoid arthritis, unspecified: Secondary | ICD-10-CM | POA: Diagnosis not present

## 2016-05-23 DIAGNOSIS — Z96651 Presence of right artificial knee joint: Secondary | ICD-10-CM | POA: Diagnosis not present

## 2016-05-24 DIAGNOSIS — K769 Liver disease, unspecified: Secondary | ICD-10-CM | POA: Diagnosis not present

## 2016-05-24 DIAGNOSIS — N281 Cyst of kidney, acquired: Secondary | ICD-10-CM | POA: Diagnosis not present

## 2016-05-24 DIAGNOSIS — N2889 Other specified disorders of kidney and ureter: Secondary | ICD-10-CM | POA: Diagnosis not present

## 2016-05-25 DIAGNOSIS — Z96651 Presence of right artificial knee joint: Secondary | ICD-10-CM | POA: Diagnosis not present

## 2016-05-25 DIAGNOSIS — M069 Rheumatoid arthritis, unspecified: Secondary | ICD-10-CM | POA: Diagnosis not present

## 2016-05-25 DIAGNOSIS — Z471 Aftercare following joint replacement surgery: Secondary | ICD-10-CM | POA: Diagnosis not present

## 2016-05-25 DIAGNOSIS — E663 Overweight: Secondary | ICD-10-CM | POA: Diagnosis not present

## 2016-05-25 DIAGNOSIS — M109 Gout, unspecified: Secondary | ICD-10-CM | POA: Diagnosis not present

## 2016-05-25 DIAGNOSIS — I10 Essential (primary) hypertension: Secondary | ICD-10-CM | POA: Diagnosis not present

## 2016-05-27 DIAGNOSIS — M109 Gout, unspecified: Secondary | ICD-10-CM | POA: Diagnosis not present

## 2016-05-27 DIAGNOSIS — I10 Essential (primary) hypertension: Secondary | ICD-10-CM | POA: Diagnosis not present

## 2016-05-27 DIAGNOSIS — M069 Rheumatoid arthritis, unspecified: Secondary | ICD-10-CM | POA: Diagnosis not present

## 2016-05-27 DIAGNOSIS — E663 Overweight: Secondary | ICD-10-CM | POA: Diagnosis not present

## 2016-05-27 DIAGNOSIS — Z96651 Presence of right artificial knee joint: Secondary | ICD-10-CM | POA: Diagnosis not present

## 2016-05-27 DIAGNOSIS — Z471 Aftercare following joint replacement surgery: Secondary | ICD-10-CM | POA: Diagnosis not present

## 2016-05-30 DIAGNOSIS — E663 Overweight: Secondary | ICD-10-CM | POA: Diagnosis not present

## 2016-05-30 DIAGNOSIS — M109 Gout, unspecified: Secondary | ICD-10-CM | POA: Diagnosis not present

## 2016-05-30 DIAGNOSIS — M069 Rheumatoid arthritis, unspecified: Secondary | ICD-10-CM | POA: Diagnosis not present

## 2016-05-30 DIAGNOSIS — Z96651 Presence of right artificial knee joint: Secondary | ICD-10-CM | POA: Diagnosis not present

## 2016-05-30 DIAGNOSIS — I10 Essential (primary) hypertension: Secondary | ICD-10-CM | POA: Diagnosis not present

## 2016-05-30 DIAGNOSIS — Z471 Aftercare following joint replacement surgery: Secondary | ICD-10-CM | POA: Diagnosis not present

## 2016-06-01 DIAGNOSIS — Z96651 Presence of right artificial knee joint: Secondary | ICD-10-CM | POA: Diagnosis not present

## 2016-06-01 DIAGNOSIS — Z471 Aftercare following joint replacement surgery: Secondary | ICD-10-CM | POA: Diagnosis not present

## 2016-06-01 DIAGNOSIS — M109 Gout, unspecified: Secondary | ICD-10-CM | POA: Diagnosis not present

## 2016-06-01 DIAGNOSIS — M069 Rheumatoid arthritis, unspecified: Secondary | ICD-10-CM | POA: Diagnosis not present

## 2016-06-01 DIAGNOSIS — E663 Overweight: Secondary | ICD-10-CM | POA: Diagnosis not present

## 2016-06-01 DIAGNOSIS — I10 Essential (primary) hypertension: Secondary | ICD-10-CM | POA: Diagnosis not present

## 2016-06-03 DIAGNOSIS — M109 Gout, unspecified: Secondary | ICD-10-CM | POA: Diagnosis not present

## 2016-06-03 DIAGNOSIS — Z96651 Presence of right artificial knee joint: Secondary | ICD-10-CM | POA: Diagnosis not present

## 2016-06-03 DIAGNOSIS — I10 Essential (primary) hypertension: Secondary | ICD-10-CM | POA: Diagnosis not present

## 2016-06-03 DIAGNOSIS — M069 Rheumatoid arthritis, unspecified: Secondary | ICD-10-CM | POA: Diagnosis not present

## 2016-06-03 DIAGNOSIS — Z471 Aftercare following joint replacement surgery: Secondary | ICD-10-CM | POA: Diagnosis not present

## 2016-06-03 DIAGNOSIS — E663 Overweight: Secondary | ICD-10-CM | POA: Diagnosis not present

## 2016-06-08 DIAGNOSIS — R2689 Other abnormalities of gait and mobility: Secondary | ICD-10-CM | POA: Diagnosis not present

## 2016-06-08 DIAGNOSIS — M25561 Pain in right knee: Secondary | ICD-10-CM | POA: Diagnosis not present

## 2016-06-09 DIAGNOSIS — I10 Essential (primary) hypertension: Secondary | ICD-10-CM | POA: Diagnosis not present

## 2016-06-09 DIAGNOSIS — Z86711 Personal history of pulmonary embolism: Secondary | ICD-10-CM | POA: Diagnosis not present

## 2016-06-14 DIAGNOSIS — R2689 Other abnormalities of gait and mobility: Secondary | ICD-10-CM | POA: Diagnosis not present

## 2016-06-14 DIAGNOSIS — M25561 Pain in right knee: Secondary | ICD-10-CM | POA: Diagnosis not present

## 2016-06-17 DIAGNOSIS — M25561 Pain in right knee: Secondary | ICD-10-CM | POA: Diagnosis not present

## 2016-06-17 DIAGNOSIS — R2689 Other abnormalities of gait and mobility: Secondary | ICD-10-CM | POA: Diagnosis not present

## 2016-06-22 DIAGNOSIS — M25561 Pain in right knee: Secondary | ICD-10-CM | POA: Diagnosis not present

## 2016-06-22 DIAGNOSIS — R2689 Other abnormalities of gait and mobility: Secondary | ICD-10-CM | POA: Diagnosis not present

## 2016-06-24 DIAGNOSIS — R2689 Other abnormalities of gait and mobility: Secondary | ICD-10-CM | POA: Diagnosis not present

## 2016-06-24 DIAGNOSIS — M25561 Pain in right knee: Secondary | ICD-10-CM | POA: Diagnosis not present

## 2016-06-27 DIAGNOSIS — M25561 Pain in right knee: Secondary | ICD-10-CM | POA: Diagnosis not present

## 2016-06-27 DIAGNOSIS — R2689 Other abnormalities of gait and mobility: Secondary | ICD-10-CM | POA: Diagnosis not present

## 2016-06-30 DIAGNOSIS — M25561 Pain in right knee: Secondary | ICD-10-CM | POA: Diagnosis not present

## 2016-06-30 DIAGNOSIS — R2689 Other abnormalities of gait and mobility: Secondary | ICD-10-CM | POA: Diagnosis not present

## 2016-07-06 DIAGNOSIS — N281 Cyst of kidney, acquired: Secondary | ICD-10-CM | POA: Diagnosis not present

## 2016-07-11 DIAGNOSIS — L309 Dermatitis, unspecified: Secondary | ICD-10-CM | POA: Diagnosis not present

## 2016-07-11 DIAGNOSIS — M65332 Trigger finger, left middle finger: Secondary | ICD-10-CM | POA: Diagnosis not present

## 2016-07-11 DIAGNOSIS — E663 Overweight: Secondary | ICD-10-CM | POA: Diagnosis not present

## 2016-07-11 DIAGNOSIS — M170 Bilateral primary osteoarthritis of knee: Secondary | ICD-10-CM | POA: Diagnosis not present

## 2016-07-11 DIAGNOSIS — Z79899 Other long term (current) drug therapy: Secondary | ICD-10-CM | POA: Diagnosis not present

## 2016-07-11 DIAGNOSIS — M0609 Rheumatoid arthritis without rheumatoid factor, multiple sites: Secondary | ICD-10-CM | POA: Diagnosis not present

## 2016-07-11 DIAGNOSIS — M25561 Pain in right knee: Secondary | ICD-10-CM | POA: Diagnosis not present

## 2016-07-11 DIAGNOSIS — Z6825 Body mass index (BMI) 25.0-25.9, adult: Secondary | ICD-10-CM | POA: Diagnosis not present

## 2016-07-11 DIAGNOSIS — M1009 Idiopathic gout, multiple sites: Secondary | ICD-10-CM | POA: Diagnosis not present

## 2016-07-18 ENCOUNTER — Other Ambulatory Visit: Payer: Self-pay | Admitting: Urology

## 2016-07-18 DIAGNOSIS — I10 Essential (primary) hypertension: Secondary | ICD-10-CM | POA: Diagnosis not present

## 2016-07-18 DIAGNOSIS — N281 Cyst of kidney, acquired: Secondary | ICD-10-CM | POA: Diagnosis not present

## 2016-07-18 DIAGNOSIS — Z86711 Personal history of pulmonary embolism: Secondary | ICD-10-CM | POA: Diagnosis not present

## 2016-07-28 ENCOUNTER — Ambulatory Visit (HOSPITAL_COMMUNITY)
Admission: RE | Admit: 2016-07-28 | Discharge: 2016-07-28 | Disposition: A | Discharge status: Home / Self Care | Payer: Commercial Managed Care - HMO | Source: Ambulatory Visit | Attending: Urology | Admitting: Urology

## 2016-07-28 DIAGNOSIS — N281 Cyst of kidney, acquired: Secondary | ICD-10-CM | POA: Diagnosis not present

## 2016-07-28 DIAGNOSIS — I700 Atherosclerosis of aorta: Secondary | ICD-10-CM | POA: Diagnosis not present

## 2016-07-28 LAB — POCT I-STAT CREATININE: Creatinine, Ser: 1.3 mg/dL — ABNORMAL HIGH (ref 0.61–1.24)

## 2016-07-28 MED ORDER — GADOBENATE DIMEGLUMINE 529 MG/ML IV SOLN
20.0000 mL | Freq: Once | INTRAVENOUS | Status: AC | PRN
  Administered 2016-07-28: 17 mL via INTRAVENOUS

## 2016-08-19 DIAGNOSIS — D49511 Neoplasm of unspecified behavior of right kidney: Secondary | ICD-10-CM | POA: Diagnosis not present

## 2016-08-23 ENCOUNTER — Other Ambulatory Visit (HOSPITAL_COMMUNITY): Payer: Self-pay | Admitting: Urology

## 2016-08-23 ENCOUNTER — Ambulatory Visit (HOSPITAL_COMMUNITY)
Admission: RE | Admit: 2016-08-23 | Discharge: 2016-08-23 | Disposition: A | Discharge status: Home / Self Care | Payer: Commercial Managed Care - HMO | Source: Ambulatory Visit | Attending: Urology | Admitting: Urology

## 2016-08-23 DIAGNOSIS — D49511 Neoplasm of unspecified behavior of right kidney: Secondary | ICD-10-CM | POA: Insufficient documentation

## 2016-08-23 DIAGNOSIS — Z01818 Encounter for other preprocedural examination: Secondary | ICD-10-CM | POA: Diagnosis not present

## 2016-08-23 DIAGNOSIS — D49519 Neoplasm of unspecified behavior of unspecified kidney: Secondary | ICD-10-CM

## 2016-08-29 ENCOUNTER — Other Ambulatory Visit: Payer: Self-pay | Admitting: Urology

## 2016-09-22 DIAGNOSIS — M1009 Idiopathic gout, multiple sites: Secondary | ICD-10-CM | POA: Diagnosis not present

## 2016-09-22 DIAGNOSIS — M170 Bilateral primary osteoarthritis of knee: Secondary | ICD-10-CM | POA: Diagnosis not present

## 2016-09-22 DIAGNOSIS — E663 Overweight: Secondary | ICD-10-CM | POA: Diagnosis not present

## 2016-09-22 DIAGNOSIS — M0609 Rheumatoid arthritis without rheumatoid factor, multiple sites: Secondary | ICD-10-CM | POA: Diagnosis not present

## 2016-09-22 DIAGNOSIS — Z79899 Other long term (current) drug therapy: Secondary | ICD-10-CM | POA: Diagnosis not present

## 2016-09-22 DIAGNOSIS — Z6826 Body mass index (BMI) 26.0-26.9, adult: Secondary | ICD-10-CM | POA: Diagnosis not present

## 2016-09-27 ENCOUNTER — Encounter (HOSPITAL_COMMUNITY): Payer: Self-pay | Admitting: *Deleted

## 2016-09-27 NOTE — Patient Instructions (Addendum)
Devin [name]  09/27/2016   Your procedure is scheduled on: 10/03/2016    Report to [facility] Main  Entrance Take [place]  elevators to 3rd floor to  [place] at  Welcome AM.     Call this number if you have problems the morning of surgery [phone]    Remember: ONLY 1 PERSON MAY GO WITH YOU TO SHORT STAY TO GET  READY MORNING OF YOUR SURGERY.  Do not eat food or drink liquids :After Midnight.     Take these medicines the morning of surgery with A SIP OF WATER: Amlodipine ( NOrvasc), Labetalol ( Normodyne)                                 You may not have any metal on your body including hair pins and              piercings  Do not wear jewelry,  lotions, powders or perfumes, deodorant                         Men may shave face and neck.   Do not bring valuables to the hospital. [place].  Contacts, dentures or bridgework may not be worn into surgery.  Leave suitcase in the car. After surgery it may be brought to your room.                      Please read over the following fact sheets you were given: _____________________________________________________________________             [facility] - Preparing for Surgery Before surgery, you can play an important role.  Because skin is not sterile, your skin needs to be as free of germs as possible.  You can reduce the number of germs on your skin by washing with CHG (chlorahexidine gluconate) soap before surgery.  CHG is an antiseptic cleaner which kills germs and bonds with the skin to continue killing germs even after washing. Please DO NOT use if you have an allergy to CHG or antibacterial soaps.  If your skin becomes reddened/irritated stop using the CHG and inform your nurse when you arrive at Short Stay. Do not shave (including legs and underarms) for at least 48 hours prior to the first CHG shower.  You may shave your face/neck. Please follow  these instructions carefully:  1.  Shower with CHG Soap the night before surgery and the  morning of Surgery.  2.  If you choose to wash your hair, wash your hair first as usual with your  normal  shampoo.  3.  After you shampoo, rinse your hair and body thoroughly to remove the  shampoo.                           4.  Use CHG as you would any other liquid soap.  You can apply chg directly  to the skin and wash                       Gently with a scrungie or clean washcloth.  5.  Apply the CHG  Soap to your body ONLY FROM THE NECK DOWN.   Do not use on face/ open                           Wound or open sores. Avoid contact with eyes, ears mouth and genitals (private parts).                       Wash face,  Genitals (private parts) with your normal soap.             6.  Wash thoroughly, paying special attention to the area where your surgery  will be performed.  7.  Thoroughly rinse your body with warm water from the neck down.  8.  DO NOT shower/wash with your normal soap after using and rinsing off  the CHG Soap.                9.  Pat yourself dry with a clean towel.            10.  Wear clean pajamas.            11.  Place clean sheets on your bed the night of your first shower and do not  sleep with pets. Day of Surgery : Do not apply any lotions/deodorants the morning of surgery.  Please wear clean clothes to the hospital/surgery center.  FAILURE TO FOLLOW THESE INSTRUCTIONS MAY RESULT IN THE CANCELLATION OF YOUR SURGERY PATIENT SIGNATURE_________________________________  NURSE SIGNATURE__________________________________  ________________________________________________________________________  WHAT IS A BLOOD TRANSFUSION? Blood Transfusion Information  A transfusion is the replacement of blood or some of its parts. Blood is made up of multiple cells which provide different functions.  Red blood cells carry oxygen and are used for blood loss replacement.  White blood cells fight  against infection.  Platelets control bleeding.  Plasma helps clot blood.  Other blood products are available for specialized needs, such as hemophilia or other clotting disorders. BEFORE THE TRANSFUSION  Who gives blood for transfusions?   Healthy volunteers who are fully evaluated to make sure their blood is safe. This is blood bank blood. Transfusion therapy is the safest it has ever been in the practice of medicine. Before blood is taken from a donor, a complete history is taken to make sure that person has no history of diseases nor engages in risky social behavior (examples are intravenous drug use or sexual activity with multiple partners). The donor's travel history is screened to minimize risk of transmitting infections, such as malaria. The donated blood is tested for signs of infectious diseases, such as HIV and hepatitis. The blood is then tested to be sure it is compatible with you in order to minimize the chance of a transfusion reaction. If you or a relative donates blood, this is often done in anticipation of surgery and is not appropriate for emergency situations. It takes many days to process the donated blood. RISKS AND COMPLICATIONS Although transfusion therapy is very safe and saves many lives, the main dangers of transfusion include:   Getting an infectious disease.  Developing a transfusion reaction. This is an allergic reaction to something in the blood you were given. Every precaution is taken to prevent this. The decision to have a blood transfusion has been considered carefully by your caregiver before blood is given. Blood is not given unless the benefits outweigh the risks. AFTER THE TRANSFUSION  Right after receiving a blood transfusion, you  will usually feel much better and more energetic. This is especially true if your red blood cells have gotten low (anemic). The transfusion raises the level of the red blood cells which carry oxygen, and this usually causes an  energy increase.  The nurse administering the transfusion will monitor you carefully for complications. HOME CARE INSTRUCTIONS  No special instructions are needed after a transfusion. You may find your energy is better. Speak with your caregiver about any limitations on activity for underlying diseases you may have. SEEK MEDICAL CARE IF:   Your condition is not improving after your transfusion.  You develop redness or irritation at the intravenous (IV) site. SEEK IMMEDIATE MEDICAL CARE IF:  Any of the following symptoms occur over the next 12 hours:  Shaking chills.  You have a temperature by mouth above 102 F (38.9 C), not controlled by medicine.  Chest, back, or muscle pain.  People around you feel you are not acting correctly or are confused.  Shortness of breath or difficulty breathing.  Dizziness and fainting.  You get a rash or develop hives.  You have a decrease in urine output.  Your urine turns a dark color or changes to pink, red, or brown. Any of the following symptoms occur over the next 10 days:  You have a temperature by mouth above 102 F (38.9 C), not controlled by medicine.  Shortness of breath.  Weakness after normal activity.  The white part of the eye turns yellow (jaundice).  You have a decrease in the amount of urine or are urinating less often.  Your urine turns a dark color or changes to pink, red, or brown. Document Released: 03/04/2000 Document Revised: 05/30/2011 Document Reviewed: 10/22/2007 ExitCare Patient Information 2014 [place].  _______________________________________________________________________  Incentive Spirometer  An incentive spirometer is a tool that can help keep your lungs clear and active. This tool measures how well you are filling your lungs with each breath. Taking long deep breaths may help reverse or decrease the chance of developing breathing (pulmonary) problems (especially infection) following:  A  long period of time when you are unable to move or be active. BEFORE THE PROCEDURE   If the spirometer includes an indicator to show your best effort, your nurse or respiratory therapist will set it to a desired goal.  If possible, sit up straight or lean slightly forward. Try not to slouch.  Hold the incentive spirometer in an upright position. INSTRUCTIONS FOR USE  1. Sit on the edge of your bed if possible, or sit up as far as you can in bed or on a chair. 2. Hold the incentive spirometer in an upright position. 3. Breathe out normally. 4. Place the mouthpiece in your mouth and seal your lips tightly around it. 5. Breathe in slowly and as deeply as possible, raising the piston or the ball toward the top of the column. 6. Hold your breath for 3-5 seconds or for as long as possible. Allow the piston or ball to fall to the bottom of the column. 7. Remove the mouthpiece from your mouth and breathe out normally. 8. Rest for a few seconds and repeat Steps 1 through 7 at least 10 times every 1-2 hours when you are awake. Take your time and take a few normal breaths between deep breaths. 9. The spirometer may include an indicator to show your best effort. Use the indicator as a goal to work toward during each repetition. 10. After each set of 10 deep breaths, practice  coughing to be sure your lungs are clear. If you have an incision (the cut made at the time of surgery), support your incision when coughing by placing a pillow or rolled up towels firmly against it. Once you are able to get out of bed, walk around indoors and cough well. You may stop using the incentive spirometer when instructed by your caregiver.  RISKS AND COMPLICATIONS  Take your time so you do not get dizzy or light-headed.  If you are in pain, you may need to take or ask for pain medication before doing incentive spirometry. It is harder to take a deep breath if you are having pain. AFTER USE  Rest and breathe slowly and  easily.  It can be helpful to keep track of a log of your progress. Your caregiver can provide you with a simple table to help with this. If you are using the spirometer at home, follow these instructions: [place] IF:   You are having difficultly using the spirometer.  You have trouble using the spirometer as often as instructed.  Your pain medication is not giving enough relief while using the spirometer.  You develop fever of 100.5 F (38.1 C) or higher. SEEK IMMEDIATE MEDICAL CARE IF:   You cough up bloody sputum that had not been present before.  You develop fever of 102 F (38.9 C) or greater.  You develop worsening pain at or near the incision site. MAKE SURE YOU:   Understand these instructions.  Will watch your condition.  Will get help right away if you are not doing well or get worse. Document Released: 07/18/2006 Document Revised: 05/30/2011 Document Reviewed: 09/18/2006 [facility] Patient Information 2014 [place], [place].   ________________________________________________________________________

## 2016-09-29 ENCOUNTER — Encounter (HOSPITAL_COMMUNITY)
Admission: RE | Admit: 2016-09-29 | Discharge: 2016-09-29 | Disposition: A | Discharge status: Home / Self Care | Payer: Medicare HMO | Source: Ambulatory Visit | Attending: Urology | Admitting: Urology

## 2016-09-29 ENCOUNTER — Encounter (HOSPITAL_COMMUNITY): Payer: Self-pay

## 2016-09-29 DIAGNOSIS — D49511 Neoplasm of unspecified behavior of right kidney: Secondary | ICD-10-CM | POA: Insufficient documentation

## 2016-09-29 DIAGNOSIS — Z01812 Encounter for preprocedural laboratory examination: Secondary | ICD-10-CM | POA: Diagnosis not present

## 2016-09-29 HISTORY — DX: Personal history of pulmonary embolism: Z86.711

## 2016-09-29 LAB — CBC
HCT: 35.8 % — ABNORMAL LOW (ref 39.0–52.0)
HEMOGLOBIN: 12.3 g/dL — AB (ref 13.0–17.0)
MCH: 33.3 pg (ref 26.0–34.0)
MCHC: 34.4 g/dL (ref 30.0–36.0)
MCV: 97 fL (ref 78.0–100.0)
PLATELETS: 195 10*3/uL (ref 150–400)
RBC: 3.69 MIL/uL — AB (ref 4.22–5.81)
RDW: 15.1 % (ref 11.5–15.5)
WBC: 6.6 10*3/uL (ref 4.0–10.5)

## 2016-09-29 LAB — BASIC METABOLIC PANEL
Anion gap: 10 (ref 5–15)
BUN: 15 mg/dL (ref 6–20)
CALCIUM: 9.1 mg/dL (ref 8.9–10.3)
CHLORIDE: 103 mmol/L (ref 101–111)
CO2: 25 mmol/L (ref 22–32)
CREATININE: 1.21 mg/dL (ref 0.61–1.24)
GFR calc Af Amer: >60 mL/min (ref >60)
GFR calc non Af Amer: 57 mL/min — ABNORMAL LOW (ref >60)
GLUCOSE: 146 mg/dL — AB (ref 65–99)
Potassium: 4 mmol/L (ref 3.5–5.1)
Sodium: 138 mmol/L (ref 135–145)

## 2016-09-29 LAB — ABO/RH: ABO/RH(D): AB NEG

## 2016-09-29 NOTE — Progress Notes (Signed)
ECHO-04/29/16-on chart  04/26/16-LOV-Dr [place]- cardiology on chart  EKG-04/22/16- on chart  CXR-08/23/16- in epic and on chart

## 2016-09-30 NOTE — H&P (Signed)
CC/HPI: Right renal neoplasm   Devin [name] is a 76 year old gentleman seen at the request of Dr. Irine Seal for a right renal neoplasm. He underwent a right total knee replacement in February and was diagnosed with a DVT/PE. His CT angiogram of the chest apparently demonstrated a possible subhepatic mass. This prompted further evaluation with CT scan of the abdomen with contrast performed in March. 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 There was also noted to be a larger adjacent interpolar simple renal cyst. Prior imaging in 2006 apparently demonstrated the simple appearing cyst which was smaller at this time. The complex cyst in the upper pole of the right kidney was noted to be new since 2006. He saw Dr. Jeffie Pollock for further evaluation and has now proceeded with a dedicated MRI of the abdomen with and without IV contrast for further evaluation. This confirmed findings that he had a hyperintense cyst with no postcontrast enhancement in the interpolar region of the kidney consistent with the benign lesion previously noted. However, off the upper pole laterally, there was an exophytic 4.1 x 3.6 cm lesion that demonstrated irregular septae and an apparent somewhat nodular enhancement along its lateral border. This was evaluated by Dr. 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 A left lower pole renal lesion was also noted that appeared consistent with an 8 mm Bosniak 2 hemorrhagic/proteinaceous cyst. There is no evidence of any regional lymphadenopathy, adrenal masses, or other evidence of metastatic disease to the abdomen. No evidence of renal tumor thrombus.   He has denied any hematuria. He has no family history of kidney cancer or end-stage renal disease.   His medical history is significant for rheumatoid arthritis  managed with methotrexate. He had been on Eliquis for treatment of his DVT/PE although has now been able to discontinue anticoagulation as of a few weeks ago. He has no prior history of DVT or PE. His other medical issue is hypertension managed with 4 antihypertensive medications.   His past surgical history significant for a prior laparoscopic hernia repair. No other abdominal surgeries.     ALLERGIES: Hydrocodone Zestril    MEDICATIONS: Allopurinol 300 mg tablet  Amlodipine Besylate 10 mg tablet  Aspirin Ec 81 mg tablet, delayed release  Eliquis 5 mg tablet  Folic Acid 1 mg tablet  Furosemide 20 mg tablet  Labetalol Hcl 300 mg tablet  Losartan Potassium 100 mg tablet  Methotrexate 2.5 mg tablet  Multivitamin  Vitamin D2 50,000 unit capsule     GU PSH: None   NON-GU PSH: Hernia Repair Knee replacement Wrist Arthroscopy/surgery    GU PMH: Renal cyst, Right, He has an enlarging complex right renal cyst. I will request a disc of the films and will consider an MRI based on that review. - 07/06/2016    NON-GU PMH: Arthritis Gout Hypertension Liver Disease Pulmonary Embolism, History    FAMILY HISTORY: Bone Cancer - Mother   SOCIAL HISTORY: Marital Status: Married Current Smoking Status: Patient does not smoke anymore.   Tobacco Use Assessment Completed: Used Tobacco in last 30 days? Drinks 3 caffeinated drinks per day.     Notes: 1 son, 1 daughter    REVIEW OF SYSTEMS:    GU Review Male:   Patient denies frequent urination, hard to postpone urination, burning/  pain with urination, get up at night to urinate, leakage of urine, stream starts and stops, trouble starting your streams, and have to strain to urinate .  Gastrointestinal (Upper):   Patient denies nausea and vomiting.  Gastrointestinal (Lower):   Patient denies diarrhea and constipation.  Constitutional:   Patient denies fever, night sweats, weight loss, and fatigue.  Skin:   Patient denies skin rash/ lesion  and itching.  Eyes:   Patient denies blurred vision and double vision.  Ears/ Nose/ Throat:   Patient denies sore throat and sinus problems.  Hematologic/Lymphatic:   Patient denies swollen glands and easy bruising.  Cardiovascular:   Patient denies leg swelling and chest pains.  Respiratory:   Patient denies cough and shortness of breath.  Endocrine:   Patient denies excessive thirst.  Musculoskeletal:   Patient denies back pain and joint pain.  Neurological:   Patient denies headaches and dizziness.  Psychologic:   Patient denies depression and anxiety.   VITAL SIGNS:      Weight 190 lb / 86.18 kg  Height 69 in / 175.26 cm    BMI 28.1 kg/m   MULTI-SYSTEM PHYSICAL EXAMINATION:    Constitutional: Well-nourished. No physical deformities. Normally developed. Good grooming.  Neck: Neck symmetrical, not swollen. Normal tracheal position.  Respiratory: No labored breathing, no use of accessory muscles. Clear bilaterally.  Cardiovascular: Normal temperature, normal extremity pulses, no swelling, no varicosities. Regular rate and rhythm.  Lymphatic: No enlargement of neck, axillae, groin.  Skin: No paleness, no jaundice, no cyanosis. No lesion, no ulcer, no rash.  Neurologic / Psychiatric: Oriented to time, oriented to place, oriented to person. No depression, no anxiety, no agitation.  Gastrointestinal: No mass, no tenderness, no rigidity, non obese abdomen. Small, well-healed infraumbilical incision from his prior laparoscopic procedure.  Eyes: Normal conjunctivae. Normal eyelids.  Ears, Nose, Mouth, and Throat: Left ear no scars, no lesions, no masses. Right ear no scars, no lesions, no masses. Nose no scars, no lesions, no masses. Normal hearing. Normal lips.  Musculoskeletal: Normal gait and station of head and neck.          ASSESSMENT:      ICD-10 Details  1 GU:   Right renal neoplasm - D49.511    PLAN:      1. 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 I discussed the potential benefits and risks of the procedure, side effects of the proposed treatment, the likelihood of the patient achieving the goals of the procedure, and any potential problems that might occur during the procedure or recuperation.

## 2016-10-03 ENCOUNTER — Ambulatory Visit (HOSPITAL_COMMUNITY): Payer: Medicare HMO | Admitting: Certified Registered"

## 2016-10-03 ENCOUNTER — Encounter (HOSPITAL_COMMUNITY)
Admission: AD | Disposition: A | Discharge status: Home / Self Care | Payer: Self-pay | Source: Ambulatory Visit | Attending: Urology

## 2016-10-03 ENCOUNTER — Inpatient Hospital Stay (HOSPITAL_COMMUNITY)
Admission: AD | Admit: 2016-10-03 | Discharge: 2016-10-05 | DRG: 658 | Disposition: A | Discharge status: Home / Self Care | Payer: Medicare HMO | Source: Ambulatory Visit | Attending: Urology | Admitting: Urology

## 2016-10-03 ENCOUNTER — Encounter (HOSPITAL_COMMUNITY): Payer: Self-pay | Admitting: *Deleted

## 2016-10-03 DIAGNOSIS — K4090 Unilateral inguinal hernia, without obstruction or gangrene, not specified as recurrent: Secondary | ICD-10-CM | POA: Diagnosis not present

## 2016-10-03 DIAGNOSIS — I10 Essential (primary) hypertension: Secondary | ICD-10-CM | POA: Diagnosis not present

## 2016-10-03 DIAGNOSIS — D49511 Neoplasm of unspecified behavior of right kidney: Secondary | ICD-10-CM | POA: Diagnosis not present

## 2016-10-03 DIAGNOSIS — C641 Malignant neoplasm of right kidney, except renal pelvis: Secondary | ICD-10-CM | POA: Diagnosis not present

## 2016-10-03 DIAGNOSIS — Z87891 Personal history of nicotine dependence: Secondary | ICD-10-CM | POA: Diagnosis not present

## 2016-10-03 HISTORY — PX: ROBOT ASSISTED LAPAROSCOPIC NEPHRECTOMY: SHX5140

## 2016-10-03 LAB — HEMOGLOBIN AND HEMATOCRIT, BLOOD
HEMATOCRIT: 33.1 % — AB (ref 39.0–52.0)
HEMOGLOBIN: 11.4 g/dL — AB (ref 13.0–17.0)

## 2016-10-03 LAB — BASIC METABOLIC PANEL
Anion gap: 3 — ABNORMAL LOW (ref 5–15)
BUN: 12 mg/dL (ref 6–20)
CALCIUM: 8.3 mg/dL — AB (ref 8.9–10.3)
CHLORIDE: 108 mmol/L (ref 101–111)
CO2: 27 mmol/L (ref 22–32)
CREATININE: 1.35 mg/dL — AB (ref 0.61–1.24)
GFR calc non Af Amer: 50 mL/min — ABNORMAL LOW (ref >60)
GFR, EST AFRICAN AMERICAN: 58 mL/min — AB (ref >60)
Glucose, Bld: 171 mg/dL — ABNORMAL HIGH (ref 65–99)
Potassium: 3.9 mmol/L (ref 3.5–5.1)
SODIUM: 138 mmol/L (ref 135–145)

## 2016-10-03 LAB — TYPE AND SCREEN
ABO/RH(D): AB NEG
Antibody Screen: NEGATIVE

## 2016-10-03 SURGERY — NEPHRECTOMY, RADICAL, ROBOT-ASSISTED, LAPAROSCOPIC, ADULT
Anesthesia: General | Laterality: Right

## 2016-10-03 MED ORDER — LACTATED RINGERS IR SOLN
Status: DC | PRN
  Administered 2016-10-03: 1

## 2016-10-03 MED ORDER — CEFAZOLIN SODIUM-DEXTROSE 2-4 GM/100ML-% IV SOLN
2.0000 g | INTRAVENOUS | Status: AC
  Administered 2016-10-03: 2 g via INTRAVENOUS

## 2016-10-03 MED ORDER — ONDANSETRON HCL 4 MG/2ML IJ SOLN
INTRAMUSCULAR | Status: AC
  Filled 2016-10-03: qty 2

## 2016-10-03 MED ORDER — STERILE WATER FOR IRRIGATION IR SOLN
Status: DC | PRN
  Administered 2016-10-03: 1000 mL

## 2016-10-03 MED ORDER — ONDANSETRON HCL 4 MG/2ML IJ SOLN
4.0000 mg | INTRAMUSCULAR | Status: DC | PRN
  Administered 2016-10-04: 4 mg via INTRAVENOUS
  Filled 2016-10-03: qty 2

## 2016-10-03 MED ORDER — HYDROMORPHONE HCL-NACL 0.5-0.9 MG/ML-% IV SOSY: 0.2500 mg | PREFILLED_SYRINGE | INTRAVENOUS | Status: DC | PRN

## 2016-10-03 MED ORDER — FUROSEMIDE 20 MG PO TABS
20.0000 mg | ORAL_TABLET | Freq: Two times a day (BID) | ORAL | Status: DC
  Administered 2016-10-03 – 2016-10-05 (×4): 20 mg via ORAL
  Filled 2016-10-03 (×4): qty 1

## 2016-10-03 MED ORDER — EPHEDRINE SULFATE-NACL 50-0.9 MG/10ML-% IV SOSY
PREFILLED_SYRINGE | INTRAVENOUS | Status: DC | PRN
  Administered 2016-10-03 (×5): 10 mg via INTRAVENOUS

## 2016-10-03 MED ORDER — AMLODIPINE BESYLATE 5 MG PO TABS
5.0000 mg | ORAL_TABLET | Freq: Every day | ORAL | Status: DC
  Administered 2016-10-04 – 2016-10-05 (×2): 5 mg via ORAL
  Filled 2016-10-03 (×2): qty 1

## 2016-10-03 MED ORDER — DEXAMETHASONE SODIUM PHOSPHATE 10 MG/ML IJ SOLN
INTRAMUSCULAR | Status: DC | PRN
  Administered 2016-10-03: 10 mg via INTRAVENOUS

## 2016-10-03 MED ORDER — NAPHAZOLINE-GLYCERIN 0.012-0.2 % OP SOLN
1.0000 [drp] | Freq: Four times a day (QID) | OPHTHALMIC | Status: DC | PRN
  Administered 2016-10-03: 2 [drp] via OPHTHALMIC
  Filled 2016-10-03: qty 15

## 2016-10-03 MED ORDER — ACETAMINOPHEN 10 MG/ML IV SOLN
1000.0000 mg | Freq: Four times a day (QID) | INTRAVENOUS | Status: DC
  Administered 2016-10-03 – 2016-10-04 (×3): 1000 mg via INTRAVENOUS
  Filled 2016-10-03 (×4): qty 100

## 2016-10-03 MED ORDER — FENTANYL CITRATE (PF) 250 MCG/5ML IJ SOLN
INTRAMUSCULAR | Status: AC
  Filled 2016-10-03: qty 5

## 2016-10-03 MED ORDER — SODIUM CHLORIDE 0.9 % IJ SOLN
INTRAMUSCULAR | Status: DC | PRN
  Administered 2016-10-03: 10 mL

## 2016-10-03 MED ORDER — ALLOPURINOL 300 MG PO TABS
300.0000 mg | ORAL_TABLET | Freq: Every day | ORAL | Status: DC
  Administered 2016-10-04 – 2016-10-05 (×2): 300 mg via ORAL
  Filled 2016-10-03 (×2): qty 1

## 2016-10-03 MED ORDER — DEXTROSE-NACL 5-0.45 % IV SOLN
INTRAVENOUS | Status: DC
  Administered 2016-10-03 – 2016-10-04 (×4): via INTRAVENOUS

## 2016-10-03 MED ORDER — DIPHENHYDRAMINE HCL 12.5 MG/5ML PO ELIX
12.5000 mg | ORAL_SOLUTION | Freq: Four times a day (QID) | ORAL | Status: DC | PRN
Start: 2016-10-03 — End: 2016-10-05

## 2016-10-03 MED ORDER — LIDOCAINE 2% (20 MG/ML) 5 ML SYRINGE
INTRAMUSCULAR | Status: DC | PRN
  Administered 2016-10-03: 100 mg via INTRAVENOUS

## 2016-10-03 MED ORDER — SUGAMMADEX SODIUM 200 MG/2ML IV SOLN
INTRAVENOUS | Status: DC | PRN
  Administered 2016-10-03: 200 mg via INTRAVENOUS

## 2016-10-03 MED ORDER — ROCURONIUM BROMIDE 50 MG/5ML IV SOSY
PREFILLED_SYRINGE | INTRAVENOUS | Status: AC
  Filled 2016-10-03: qty 5

## 2016-10-03 MED ORDER — PHENYLEPHRINE 40 MCG/ML (10ML) SYRINGE FOR IV PUSH (FOR BLOOD PRESSURE SUPPORT)
PREFILLED_SYRINGE | INTRAVENOUS | Status: DC | PRN
  Administered 2016-10-03 (×3): 80 ug via INTRAVENOUS

## 2016-10-03 MED ORDER — FENTANYL CITRATE (PF) 100 MCG/2ML IJ SOLN
INTRAMUSCULAR | Status: DC | PRN
  Administered 2016-10-03 (×5): 50 ug via INTRAVENOUS

## 2016-10-03 MED ORDER — PHENYLEPHRINE HCL 10 MG/ML IJ SOLN
INTRAMUSCULAR | Status: AC
  Filled 2016-10-03: qty 1

## 2016-10-03 MED ORDER — LABETALOL HCL 200 MG PO TABS
300.0000 mg | ORAL_TABLET | Freq: Two times a day (BID) | ORAL | Status: DC
  Administered 2016-10-03 – 2016-10-05 (×4): 300 mg via ORAL
  Filled 2016-10-03 (×4): qty 1

## 2016-10-03 MED ORDER — HYDROCODONE-ACETAMINOPHEN 5-325 MG PO TABS: 1.0000 | ORAL_TABLET | Freq: Four times a day (QID) | ORAL | 0 refills | Status: DC | PRN

## 2016-10-03 MED ORDER — PROPOFOL 10 MG/ML IV BOLUS
INTRAVENOUS | Status: DC | PRN
  Administered 2016-10-03: 160 mg via INTRAVENOUS

## 2016-10-03 MED ORDER — HEPARIN SODIUM (PORCINE) 1000 UNIT/ML IJ SOLN
INTRAMUSCULAR | Status: AC
  Filled 2016-10-03: qty 1

## 2016-10-03 MED ORDER — LIDOCAINE 2% (20 MG/ML) 5 ML SYRINGE
INTRAMUSCULAR | Status: AC
  Filled 2016-10-03: qty 5

## 2016-10-03 MED ORDER — PROMETHAZINE HCL 25 MG/ML IJ SOLN: 6.2500 mg | INTRAMUSCULAR | Status: DC | PRN

## 2016-10-03 MED ORDER — PROPOFOL 10 MG/ML IV BOLUS
INTRAVENOUS | Status: AC
  Filled 2016-10-03: qty 20

## 2016-10-03 MED ORDER — SODIUM CHLORIDE 0.9 % IJ SOLN
INTRAMUSCULAR | Status: AC
  Filled 2016-10-03: qty 20

## 2016-10-03 MED ORDER — LACTATED RINGERS IV SOLN
INTRAVENOUS | Status: DC | PRN
  Administered 2016-10-03 (×3): via INTRAVENOUS

## 2016-10-03 MED ORDER — PHENYLEPHRINE HCL 10 MG/ML IJ SOLN
INTRAVENOUS | Status: DC | PRN
  Administered 2016-10-03: 15 ug/min via INTRAVENOUS

## 2016-10-03 MED ORDER — DEXAMETHASONE SODIUM PHOSPHATE 10 MG/ML IJ SOLN
INTRAMUSCULAR | Status: AC
  Filled 2016-10-03: qty 1

## 2016-10-03 MED ORDER — BUPIVACAINE-EPINEPHRINE (PF) 0.25% -1:200000 IJ SOLN
INTRAMUSCULAR | Status: AC
  Filled 2016-10-03: qty 30

## 2016-10-03 MED ORDER — MORPHINE SULFATE (PF) 4 MG/ML IV SOLN
2.0000 mg | INTRAVENOUS | Status: DC | PRN
  Administered 2016-10-03 (×2): 4 mg via INTRAVENOUS
  Administered 2016-10-03: 2 mg via INTRAVENOUS
  Filled 2016-10-03 (×3): qty 1

## 2016-10-03 MED ORDER — DIPHENHYDRAMINE HCL 50 MG/ML IJ SOLN: 12.5000 mg | Freq: Four times a day (QID) | INTRAMUSCULAR | Status: DC | PRN

## 2016-10-03 MED ORDER — DOCUSATE SODIUM 100 MG PO CAPS
100.0000 mg | ORAL_CAPSULE | Freq: Two times a day (BID) | ORAL | Status: DC
  Administered 2016-10-03 – 2016-10-05 (×4): 100 mg via ORAL
  Filled 2016-10-03 (×4): qty 1

## 2016-10-03 MED ORDER — CEFAZOLIN SODIUM-DEXTROSE 2-4 GM/100ML-% IV SOLN
INTRAVENOUS | Status: AC
  Filled 2016-10-03: qty 100

## 2016-10-03 MED ORDER — HYDROMORPHONE HCL-NACL 0.5-0.9 MG/ML-% IV SOSY
PREFILLED_SYRINGE | INTRAVENOUS | Status: AC
  Filled 2016-10-03: qty 2

## 2016-10-03 MED ORDER — MIDAZOLAM HCL 2 MG/2ML IJ SOLN
INTRAMUSCULAR | Status: AC
  Filled 2016-10-03: qty 2

## 2016-10-03 MED ORDER — LACTATED RINGERS IV SOLN: INTRAVENOUS | Status: DC

## 2016-10-03 MED ORDER — ROCURONIUM BROMIDE 10 MG/ML (PF) SYRINGE
PREFILLED_SYRINGE | INTRAVENOUS | Status: DC | PRN
  Administered 2016-10-03: 10 mg via INTRAVENOUS
  Administered 2016-10-03: 50 mg via INTRAVENOUS
  Administered 2016-10-03: 20 mg via INTRAVENOUS

## 2016-10-03 MED ORDER — BUPIVACAINE LIPOSOME 1.3 % IJ SUSP
20.0000 mL | Freq: Once | INTRAMUSCULAR | Status: AC
  Administered 2016-10-03: 20 mL
  Filled 2016-10-03: qty 20

## 2016-10-03 MED ORDER — PHENYLEPHRINE 40 MCG/ML (10ML) SYRINGE FOR IV PUSH (FOR BLOOD PRESSURE SUPPORT)
PREFILLED_SYRINGE | INTRAVENOUS | Status: AC
  Filled 2016-10-03: qty 10

## 2016-10-03 MED ORDER — CEFAZOLIN SODIUM-DEXTROSE 1-4 GM/50ML-% IV SOLN
1.0000 g | Freq: Three times a day (TID) | INTRAVENOUS | Status: AC
  Administered 2016-10-03 – 2016-10-04 (×2): 1 g via INTRAVENOUS
  Filled 2016-10-03 (×2): qty 50

## 2016-10-03 MED ORDER — ONDANSETRON HCL 4 MG/2ML IJ SOLN
INTRAMUSCULAR | Status: DC | PRN
  Administered 2016-10-03: 4 mg via INTRAVENOUS

## 2016-10-03 MED ORDER — MIDAZOLAM HCL 5 MG/5ML IJ SOLN
INTRAMUSCULAR | Status: DC | PRN
  Administered 2016-10-03 (×2): 1 mg via INTRAVENOUS

## 2016-10-03 SURGICAL SUPPLY — 53 items
ADH SKN CLS APL DERMABOND .7 (GAUZE/BANDAGES/DRESSINGS) ×1
AGENT HMST KT MTR STRL THRMB (HEMOSTASIS) ×1
APL ESCP 34 STRL LF DISP (HEMOSTASIS) ×1
APPLICATOR SURGIFLO ENDO (HEMOSTASIS) ×1 IMPLANT
CHLORAPREP W/TINT 26ML (MISCELLANEOUS) ×2 IMPLANT
CLIP LIGATING HEM O LOK PURPLE (MISCELLANEOUS) ×2 IMPLANT
CLIP LIGATING HEMO O LOK GREEN (MISCELLANEOUS) ×4 IMPLANT
COVER SURGICAL LIGHT HANDLE (MISCELLANEOUS) ×2 IMPLANT
COVER TIP SHEARS 8 DVNC (MISCELLANEOUS) ×1 IMPLANT
COVER TIP SHEARS 8MM DA VINCI (MISCELLANEOUS) ×2
DECANTER SPIKE VIAL GLASS SM (MISCELLANEOUS) ×2 IMPLANT
DERMABOND ADVANCED (GAUZE/BANDAGES/DRESSINGS) ×1
DERMABOND ADVANCED .7 DNX12 (GAUZE/BANDAGES/DRESSINGS) ×2 IMPLANT
DRAIN CHANNEL 15F RND FF 3/16 (WOUND CARE) ×1 IMPLANT
DRAPE ARM DVNC X/XI (DISPOSABLE) ×4 IMPLANT
DRAPE COLUMN DVNC XI (DISPOSABLE) ×1 IMPLANT
DRAPE DA VINCI XI ARM (DISPOSABLE) ×4
DRAPE DA VINCI XI COLUMN (DISPOSABLE) ×1
DRAPE INCISE IOBAN 66X45 STRL (DRAPES) ×2 IMPLANT
DRAPE SHEET LG 3/4 BI-LAMINATE (DRAPES) ×2 IMPLANT
DRSG TEGADERM 4X4.75 (GAUZE/BANDAGES/DRESSINGS) ×1 IMPLANT
ELECT PENCIL ROCKER SW 15FT (MISCELLANEOUS) ×2 IMPLANT
ELECT REM PT RETURN 15FT ADLT (MISCELLANEOUS) ×2 IMPLANT
EVACUATOR SILICONE 100CC (DRAIN) ×2 IMPLANT
GLOVE BIO SURGEON STRL SZ 6.5 (GLOVE) ×2 IMPLANT
GLOVE BIOGEL M STRL SZ7.5 (GLOVE) ×4 IMPLANT
GOWN STRL REUS W/TWL LRG LVL3 (GOWN DISPOSABLE) ×8 IMPLANT
IRRIG SUCT STRYKERFLOW 2 WTIP (MISCELLANEOUS) ×2
IRRIGATION SUCT STRKRFLW 2 WTP (MISCELLANEOUS) IMPLANT
KIT BASIN OR (CUSTOM PROCEDURE TRAY) ×2 IMPLANT
NS IRRIG 1000ML POUR BTL (IV SOLUTION) ×1 IMPLANT
POSITIONER SURGICAL ARM (MISCELLANEOUS) ×4 IMPLANT
SEAL CANN UNIV 5-8 DVNC XI (MISCELLANEOUS) ×4 IMPLANT
SEAL XI 5MM-8MM UNIVERSAL (MISCELLANEOUS) ×4
SOLUTION ELECTROLUBE (MISCELLANEOUS) ×2 IMPLANT
SURGIFLO W/THROMBIN 8M KIT (HEMOSTASIS) ×1 IMPLANT
SUT ETHILON 3 0 PS 1 (SUTURE) IMPLANT
SUT MNCRL AB 4-0 PS2 18 (SUTURE) ×4 IMPLANT
SUT PDS AB 0 CTX 36 PDP370T (SUTURE) IMPLANT
SUT V-LOC BARB 180 2/0GR6 GS22 (SUTURE) ×2
SUT VIC AB 0 CT1 27 (SUTURE) ×2
SUT VIC AB 0 CT1 27XBRD ANTBC (SUTURE) ×1 IMPLANT
SUT VICRYL 0 UR6 27IN ABS (SUTURE) ×3 IMPLANT
SUT VLOC BARB 180 ABS3/0GR12 (SUTURE) ×2
SUTURE V-LC BRB 180 2/0GR6GS22 (SUTURE) ×1 IMPLANT
SUTURE VLOC BRB 180 ABS3/0GR12 (SUTURE) ×1 IMPLANT
TAPE STRIPS DRAPE STRL (GAUZE/BANDAGES/DRESSINGS) ×1 IMPLANT
TOWEL OR NON WOVEN STRL DISP B (DISPOSABLE) ×2 IMPLANT
TRAY FOLEY W/METER SILVER 16FR (SET/KITS/TRAYS/PACK) ×2 IMPLANT
TRAY LAPAROSCOPIC (CUSTOM PROCEDURE TRAY) ×2 IMPLANT
TROCAR UNIVERSAL OPT 12M 100M (ENDOMECHANICALS) IMPLANT
TROCAR XCEL 12X100 BLDLESS (ENDOMECHANICALS) ×2 IMPLANT
WATER STERILE IRR 1000ML POUR (IV SOLUTION) ×2 IMPLANT

## 2016-10-03 NOTE — Transfer of Care (Signed)
Immediate Anesthesia Transfer of Care Note  Patient: Devin [name]  Procedure(s) Performed: Procedure(s): XI ROBOTIC ASSISTED LAPAROSCOPIC  PARTIAL NEPHRECTOMY (Right)  Patient Location: PACU  Anesthesia Type:General  Level of Consciousness: awake, alert  and oriented  Airway & Oxygen Therapy: Patient Spontanous Breathing and Patient connected to face mask oxygen  Post-op Assessment: Report given to RN and Post -op Vital signs reviewed and stable  Post vital signs: Reviewed and stable  Last Vitals:  Vitals:   10/03/16 0515 10/03/16 0535  BP: (!) 192/81 (!) 177/86  Pulse: 66   Resp: 16   Temp: 37 C     Last Pain:  Vitals:   10/03/16 0535  TempSrc:   PainSc: 0-No pain      Patients Stated Pain Goal: 3 (53/66/[id number])  Complications: No apparent anesthesia complications

## 2016-10-03 NOTE — Anesthesia Postprocedure Evaluation (Signed)
Anesthesia Post Note  Patient: Devin [name]  Procedure(s) Performed: Procedure(s) (LRB): XI ROBOTIC ASSISTED LAPAROSCOPIC  PARTIAL NEPHRECTOMY (Right)     Patient location during evaluation: PACU Anesthesia Type: General Level of consciousness: awake and alert Pain management: pain level controlled Vital Signs Assessment: post-procedure vital signs reviewed and stable Respiratory status: spontaneous breathing, nonlabored ventilation, respiratory function stable and patient connected to nasal cannula oxygen Cardiovascular status: blood pressure returned to baseline and stable Postop Assessment: no signs of nausea or vomiting Anesthetic complications: no    Last Vitals:  Vitals:   10/03/16 1215 10/03/16 1230  BP: (!) 127/94 138/83  Pulse: 66 70  Resp: 15 16  Temp: 36.5 C 36.4 C    Last Pain:  Vitals:   10/03/16 1342  TempSrc:   PainSc: Asleep                 Naleyah Ohlinger S

## 2016-10-03 NOTE — Op Note (Signed)
Preoperative diagnosis: Right renal neoplasm  Postoperative diagnosis: Right renal neolasm  Procedure:  1. Right robotic-assisted laparoscopic partial nephrectomy  Surgeon: Pryor Curia. M.D.  Assistant(s): Debbrah Alar, PA-C  An assistant was required for this surgical procedure.  The duties of the assistant included but were not limited to suctioning, passing suture, camera manipulation, retraction. This procedure would not be able to be performed without an Environmental consultant.  Resident: Dr. Jonna Clark  Anesthesia: General  Complications: None  EBL: 100 mL  IVF:  2500 mL crystalloid  Specimens: 1. Right renal neoplasm  Disposition of specimens: Pathology  Intraoperative findings:       1. Warm renal ischemia time: 10 minutes  Drains: 1. # 15 Blake perinephric drain  Indication:  Devin [name] is a 76 y.o. year old patient with a right renal mass.  After a thorough review of the management options for their renal mass, they elected to proceed with surgical treatment and the above procedure.  We have discussed the potential benefits and risks of the procedure, side effects of the proposed treatment, the likelihood of the patient achieving the goals of the procedure, and any potential problems that might occur during the procedure or recuperation. Informed consent has been obtained.   Description of procedure:  The patient was taken to the operating room and a general anesthetic was administered. The patient was given preoperative antibiotics, placed in the right modified flank position with care to pad all potential pressure points, and prepped and draped in the usual sterile fashion. Next a preoperative timeout was performed.  A site was selected on in the midline for placement of the assistant port. This was placed using a standard open Hassan technique which allowed entry into the peritoneal cavity under direct vision and without difficulty. A 12 mm port was placed  and a pneumoperitoneum established. The camera was then used to inspect the abdomen and there was no evidence of any intra-abdominal injuries or other abnormalities except for a few intra-abdominal adhesions that were taken down from the abdominal wall in the right lower quadrant related to his prior surgery. The remaining abdominal ports were then placed. 8 mm robotic ports were placed in the right upper quadrant, right lower quadrant, and far right lateral abdominal wall. An 8 mm port was placed for the camera site just right of the umbilicus. All ports were placed under direct vision without difficulty. The surgical cart was then docked.   Utilizing the cautery scissors, the white line of Toldt was incised allowing the colon to be mobilized medially and the plane between the mesocolon and the anterior layer of Gerota's fascia to be developed and the kidney to be exposed.  The ureter and gonadal vein were identified inferiorly and the ureter was lifted anteriorly off the psoas muscle.  Dissection proceeded superiorly along the gonadal vein until the renal vein was identified.  The renal hilum was then carefully isolated with a combination of blunt and sharp dissection allowing the renal arterial and venous structures to be separated and isolated in preparation for renal hilar vessel clamping. There was a main renal artery and vein and a second smaller upper pole renal artery.  Attention turned to the kidney and the perinephric fat surrounding the renal mass was removed and the kidney was mobilized sufficiently for exposure and resection of the renal mass.  The perinephric fat was quite adherent to the renal capsule and require careful and tedious dissection to isolate the renal mass and  clear off the fat from the surrounding renal capsule.  Once the renal mass was properly isolated, preparations were made for resection of the tumor. Reconstructive sutures were placed into the abdomen for the renorrhaphy  portion of the procedure.  The main renal artery and the upper pole renal artery were then clamped with bulldog clamps.  The tumor was then excised with cold scissor dissection along with an adequate visible gross margin of normal renal parenchyma. The tumor appeared to be excised without any gross violation of the tumor. The renal collecting system was entered during removal of the tumor.  A running 3-0 V-lock suture was then brought through the capsule of the kidney and run along the base of the renal defect to provide hemostasis and close any entry into the renal collecting system if present. Weck clips were used to secure this suture outside the renal capsule at the proximal and distal ends. An additional hemostatic agent (Surgiflo) was then placed into the renal defect. A running 2-0 V lock suture was then used to close the capsule of the kidney using a sliding clip technique which resulted in excellent hemostasis.    The bulldog clamps were then removed from the renal hilar vessel(s).. Total warm renal ischemia time was 10 minutes. The renal tumor resection site was examined. Hemostasis appeared adequate.   The kidney was placed back into its normal anatomic position and covered with perinephric fat as needed.  A # 36 Blake drain was then brought through the lateral lower port site and positioned in the perinephric space.  It was secured to the skin with a nylon suture. The surgical cart was undocked.  The renal tumor specimen was removed intact within an endopouch retrieval bag via the upper midline port site. This incision site was closed at the fascial layer with 0-vicryl suture. All other laparoscopic/robotic ports were removed under direct vision and the pneumoperitoneum let down with inspection of the operative field performed and hemostasis again confirmed. All incision sites were then injected with local anesthetic and reapproximated at the skin level with 4-0 monocryl subcuticular closures.   Dermabond was applied to the skin.  The patient tolerated the procedure well and without complications.  The patient was able to be extubated and transferred to the recovery unit in satisfactory condition.  Pryor Curia MD

## 2016-10-03 NOTE — Anesthesia Preprocedure Evaluation (Signed)
Anesthesia Evaluation  Patient identified by MRN, date of birth, ID band Patient awake    Reviewed: Allergy & Precautions, NPO status , Patient's Chart, lab work & pertinent test results  Airway Mallampati: II  TM Distance: >3 FB Neck ROM: Full    Dental no notable dental hx.    Pulmonary former smoker, PE   Pulmonary exam normal breath sounds clear to auscultation       Cardiovascular hypertension, Normal cardiovascular exam Rhythm:Regular Rate:Normal     Neuro/Psych negative neurological ROS  negative psych ROS   GI/Hepatic negative GI ROS, Neg liver ROS,   Endo/Other  negative endocrine ROS  Renal/GU negative Renal ROS  negative genitourinary   Musculoskeletal negative musculoskeletal ROS (+)   Abdominal   Peds negative pediatric ROS (+)  Hematology negative hematology ROS (+)   Anesthesia Other Findings   Reproductive/Obstetrics negative OB ROS                             Anesthesia Physical Anesthesia Plan  ASA: III  Anesthesia Plan: General   Post-op Pain Management:    Induction: Intravenous  PONV Risk Score and Plan: 1 and Ondansetron and Dexamethasone  Airway Management Planned: Oral ETT  Additional Equipment:   Intra-op Plan:   Post-operative Plan: Extubation in OR  Informed Consent: I have reviewed the patients History and Physical, chart, labs and discussed the procedure including the risks, benefits and alternatives for the proposed anesthesia with the patient or authorized representative who has indicated his/her understanding and acceptance.   Dental advisory given  Plan Discussed with: CRNA and Surgeon  Anesthesia Plan Comments:         Anesthesia Quick Evaluation

## 2016-10-03 NOTE — Progress Notes (Signed)
Post-op note  Subjective: The patient is endorsing significant pain in bilateral eyes. Both eyes bloodshot. No vision changes on exam and patient denies blurriness. Likely reactive from tape during anesthesia. Pain 7/10, not yet had morphine.    Objective: Vital signs in last 24 hours: Temp:  [97.6 F (36.4 C)-98.6 F (37 C)] 97.6 F (36.4 C) (07/16 1230) Pulse Rate:  [62-70] 70 (07/16 1230) Resp:  [12-17] 16 (07/16 1230) BP: (116-192)/(79-94) 138/83 (07/16 1230) SpO2:  [94 %-100 %] 96 % (07/16 1230) Weight:  [86.2 kg (190 lb)-88.7 kg (195 lb 8.8 oz)] 88.7 kg (195 lb 8.8 oz) (07/16 1234)  Intake/Output from previous day: No intake/output data recorded. Intake/Output this shift: Total I/O In: 2900 [I.V.:2900] Out: 320 [Urine:150; Drains:70; Blood:100]  Physical Exam:  General: Alert and oriented. Abdomen: Soft, Nondistended. Incisions: Clean and dry. JP drain with SS drainage GU: foley catheter to drainage, draining clear yellow urine   Lab Results:  Recent Labs  10/03/16 1204  HGB 11.4*  HCT 33.1*    Assessment/Plan: POD#0 s/p R partial nephrectomy   1) Continue to monitor 2) Eye drops for irritation, if not successful could try antihistamine    Jonna Clark, MD   LOS: 0 days   Emmalin Jaquess L 10/03/2016, 3:55 PM

## 2016-10-03 NOTE — Anesthesia Procedure Notes (Signed)
Procedure Name: Intubation Date/Time: 10/03/2016 7:27 AM Performed by: Noralyn Pick D Pre-anesthesia Checklist: Patient identified, Emergency Drugs available, Suction available and Patient being monitored Patient Re-evaluated:Patient Re-evaluated prior to induction Oxygen Delivery Method: Circle system utilized Preoxygenation: Pre-oxygenation with 100% oxygen Induction Type: IV induction Ventilation: Mask ventilation without difficulty Laryngoscope Size: Mac and 4 Grade View: Grade I Tube type: Oral Tube size: 7.5 mm Number of attempts: 1 Airway Equipment and Method: Stylet Placement Confirmation: ETT inserted through vocal cords under direct vision,  positive ETCO2 and breath sounds checked- equal and bilateral Secured at: 22 cm Tube secured with: Tape Dental Injury: Teeth and Oropharynx as per pre-operative assessment

## 2016-10-03 NOTE — Discharge Instructions (Signed)

## 2016-10-04 LAB — BASIC METABOLIC PANEL
Anion gap: 10 (ref 5–15)
BUN: 17 mg/dL (ref 6–20)
CHLORIDE: 99 mmol/L — AB (ref 101–111)
CO2: 24 mmol/L (ref 22–32)
Calcium: 8.5 mg/dL — ABNORMAL LOW (ref 8.9–10.3)
Creatinine, Ser: 1.41 mg/dL — ABNORMAL HIGH (ref 0.61–1.24)
GFR, EST AFRICAN AMERICAN: 55 mL/min — AB (ref >60)
GFR, EST NON AFRICAN AMERICAN: 47 mL/min — AB (ref >60)
GLUCOSE: 187 mg/dL — AB (ref 65–99)
Potassium: 4.1 mmol/L (ref 3.5–5.1)
SODIUM: 133 mmol/L — AB (ref 135–145)

## 2016-10-04 LAB — HEMOGLOBIN AND HEMATOCRIT, BLOOD
HCT: 32.4 % — ABNORMAL LOW (ref 39.0–52.0)
Hemoglobin: 11.3 g/dL — ABNORMAL LOW (ref 13.0–17.0)

## 2016-10-04 LAB — CREATININE, FLUID (PLEURAL, PERITONEAL, JP DRAINAGE): CREAT FL: 1.4 mg/dL

## 2016-10-04 MED ORDER — BISACODYL 10 MG RE SUPP
10.0000 mg | Freq: Once | RECTAL | Status: AC
  Administered 2016-10-04: 10 mg via RECTAL
  Filled 2016-10-04: qty 1

## 2016-10-04 MED ORDER — TAMSULOSIN HCL 0.4 MG PO CAPS: 0.4000 mg | ORAL_CAPSULE | Freq: Every day | ORAL | Status: DC

## 2016-10-04 MED ORDER — LOSARTAN POTASSIUM 50 MG PO TABS
100.0000 mg | ORAL_TABLET | Freq: Every day | ORAL | Status: DC
  Administered 2016-10-04 – 2016-10-05 (×2): 100 mg via ORAL
  Filled 2016-10-04 (×2): qty 2

## 2016-10-04 MED ORDER — HYDRALAZINE HCL 20 MG/ML IJ SOLN
10.0000 mg | Freq: Four times a day (QID) | INTRAMUSCULAR | Status: DC | PRN
  Administered 2016-10-04: 10 mg via INTRAVENOUS
  Filled 2016-10-04 (×2): qty 0.5

## 2016-10-04 MED ORDER — ACETAMINOPHEN 325 MG PO TABS
650.0000 mg | ORAL_TABLET | ORAL | Status: DC | PRN
  Administered 2016-10-04: 650 mg via ORAL
  Filled 2016-10-04: qty 2

## 2016-10-04 MED ORDER — HYDROCODONE-ACETAMINOPHEN 5-325 MG PO TABS
1.0000 | ORAL_TABLET | Freq: Four times a day (QID) | ORAL | Status: DC | PRN
  Administered 2016-10-04: 1 via ORAL
  Administered 2016-10-05: 2 via ORAL
  Filled 2016-10-04: qty 1
  Filled 2016-10-04: qty 2

## 2016-10-04 NOTE — Progress Notes (Signed)
Patient leaking for JP site. Dressing reinforced, gown and bed pad changed. Will continue to monitor site.

## 2016-10-04 NOTE — Progress Notes (Signed)
Patient ID: Devin [name], male   DOB: 02-24-1941, 76 y.o.   MRN: [id number]  1 Day Post-Op Subjective: Pt doing well.  Eye pain is resolved.  Feels bloated.  No nausea or vomiting.  No flatus.  Pain controlled.  Objective: Vital signs in last 24 hours: Temp:  [97.6 F (36.4 C)-98.2 F (36.8 C)] 97.6 F (36.4 C) (07/17 0618) Pulse Rate:  [62-82] 74 (07/17 0618) Resp:  [12-22] 22 (07/17 0618) BP: (116-158)/(79-97) 149/97 (07/17 0618) SpO2:  [94 %-100 %] 96 % (07/17 0618) Weight:  [88.7 kg (195 lb 8.8 oz)] 88.7 kg (195 lb 8.8 oz) (07/16 1234)  Intake/Output from previous day: 07/16 0701 - 07/17 0700 In: 5105 [P.O.:360; I.V.:4395; IV Piggyback:350] Out: 2655 [Urine:2325; Drains:230; Blood:100] Intake/Output this shift: No intake/output data recorded.  Physical Exam:  General: Alert and oriented CV: RRR Lungs: Clear Abdomen: Soft, ND, Distended, active bowel sounds, drain with minimal serosanguinous drainage Incisions: C/D/I Ext: NT, No erythema  Lab Results:  Recent Labs  10/03/16 1204 10/04/16 0511  HGB 11.4* 11.3*  HCT 33.1* 32.4*   BMET  Recent Labs  10/03/16 1204 10/04/16 0511  NA 138 133*  K 3.9 4.1  CL 108 99*  CO2 27 24  GLUCOSE 171* 187*  BUN 12 17  CREATININE 1.35* 1.41*  CALCIUM 8.3* 8.5*     Studies/Results: No results found.  Assessment/Plan: POD #1 s/p right RAL partial nephrectomy - Ambulate, IS, DVT prophylaxis - Advance diet - Oral pain medication - Monitor renal function - Dulcolax suppository - D/C Foley - Path pending - Decrease IVF    LOS: 1 day   Zuly Belkin,LES 10/04/2016, 7:14 AM

## 2016-10-04 NOTE — Progress Notes (Deleted)
Patient ID: Devin [name], male   DOB: Jun 04, 1940, 76 y.o.   MRN: [id number]   Pt with severe abdominal pain this evening.  Complains of pain over SP region.  He just voided 200 cc clear urine but does feel distended in SP region.  Bladder scan indicated about 120 cc in bladder PVR.   He has been passing flatus but not lately.  He has been ambulating well.  Describes pain as 10/10.  Pain not in incisional areas. No peritoneal signs.  I inserted a 16Fr Foley without resistance with return of 200 cc of clear urine.  He felt mildly better but still with pain in lower abdomen.  I suspect he has gas pains.  Will start Reglan tonight.  Enema in AM if not improved.  Cautioned against continued narcotic use as I do not suspect pain is incisional.  Will leave catheter in place as patient feels slightly better with catheter in. Will start tamsulosin as a precaution - patient has previous history of retention and has been on this in the past.

## 2016-10-04 NOTE — Progress Notes (Signed)
Patient ID: Devin [name], male   DOB: 04-28-1940, 76 y.o.   MRN: [id number]   Pt doing well.  Still distended but has had some flatus today.   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  Will send drain fluid for Cr level.  SL IVF.  Will try another suppository tonight.

## 2016-10-05 LAB — BASIC METABOLIC PANEL
Anion gap: 9 (ref 5–15)
BUN: 16 mg/dL (ref 6–20)
CHLORIDE: 98 mmol/L — AB (ref 101–111)
CO2: 25 mmol/L (ref 22–32)
CREATININE: 1.25 mg/dL — AB (ref 0.61–1.24)
Calcium: 8.4 mg/dL — ABNORMAL LOW (ref 8.9–10.3)
GFR calc non Af Amer: 55 mL/min — ABNORMAL LOW (ref >60)
Glucose, Bld: 169 mg/dL — ABNORMAL HIGH (ref 65–99)
POTASSIUM: 3.7 mmol/L (ref 3.5–5.1)
Sodium: 132 mmol/L — ABNORMAL LOW (ref 135–145)

## 2016-10-05 LAB — HEMOGLOBIN AND HEMATOCRIT, BLOOD
HEMATOCRIT: 32.1 % — AB (ref 39.0–52.0)
Hemoglobin: 11.1 g/dL — ABNORMAL LOW (ref 13.0–17.0)

## 2016-10-05 MED ORDER — FLEET ENEMA 7-19 GM/118ML RE ENEM
1.0000 | ENEMA | Freq: Once | RECTAL | Status: AC
  Administered 2016-10-05: 1 via RECTAL
  Filled 2016-10-05: qty 1

## 2016-10-05 NOTE — Care Management Note (Signed)
Case Management Note  Patient Details  Name: Devin [name] MRN: [id number] Date of Birth: 1940-09-28  Subjective/Objective: 76 y/o m admitted w/R kidney neoplasm. From home.                   Action/Plan:d/c plan home.   Expected Discharge Date:                  Expected Discharge Plan:  Home/Self Care  In-House Referral:     Discharge planning Services  CM Consult  Post Acute Care Choice:    Choice offered to:     DME Arranged:    DME Agency:     HH Arranged:    HH Agency:     Status of Service:  In process, will continue to follow  If discussed at Long Length of Stay Meetings, dates discussed:    Additional Comments:  Dessa Phi, RN 10/05/2016, 12:38 PM

## 2016-10-05 NOTE — Progress Notes (Signed)
Patient ID: Devin [name], male   DOB: 1940-11-24, 76 y.o.   MRN: [id number]  2 Days Post-Op Subjective: Still no flatus or BM overnight.  Did have one more episode of vomiting (small) last evening.  Currently no nausea.  Pain controlled.  Objective: Vital signs in last 24 hours: Temp:  [98.2 F (36.8 C)-99.1 F (37.3 C)] 98.2 F (36.8 C) (07/18 0600) Pulse Rate:  [74-95] 88 (07/18 0600) Resp:  [20-24] 20 (07/18 0600) BP: (157-184)/(78-99) 161/99 (07/18 0600) SpO2:  [93 %-95 %] 93 % (07/18 0600)  Intake/Output from previous day: 07/17 0701 - 07/18 0700 In: 2363.8 [P.O.:480; I.V.:1883.8] Out: 2545 [Urine:2425; Drains:120] Intake/Output this shift: No intake/output data recorded.  Physical Exam:  General: Alert and oriented CV: RRR Lungs: Clear Abdomen: Less distended, Positive BS Incisions: C/D/I Ext: NT, No erythema  Lab Results:  Recent Labs  10/03/16 1204 10/04/16 0511 10/05/16 0443  HGB 11.4* 11.3* 11.1*  HCT 33.1* 32.4* 32.1*   BMET  Recent Labs  10/04/16 0511 10/05/16 0443  NA 133* 132*  K 4.1 3.7  CL 99* 98*  CO2 24 25  GLUCOSE 187* 169*  BUN 17 16  CREATININE 1.41* 1.25*  CALCIUM 8.5* 8.4*     Studies/Results: JP Cr 1.4  Assessment/Plan: POD # 2 s/p right RAL partial nephrectomy - D/C drain - SL IVF - Ambulate - Will try enema this morning - Discharge home if bowel activity and feeling better later today   LOS: 2 days   Ruchama Kubicek,LES 10/05/2016, 7:10 AM

## 2016-10-05 NOTE — Discharge Summary (Signed)
[facility] Discharge Summary  Admit date: 10/03/2016  Discharge date and time: 10/05/16   Discharge to: Home  Discharge Service: Urology  Discharge Attending Physician:  Dr. Dutch Gray  Discharge  Diagnoses: Right renal neoplasm   Secondary Diagnosis: Active Problems:   Neoplasm of right kidney   OR Procedures: Procedure(s): XI ROBOTIC ASSISTED LAPAROSCOPIC  PARTIAL NEPHRECTOMY 10/03/2016   Ancillary Procedures: None   Discharge Day Services: The patient was seen and examined by the Urology team both in the morning and immediately prior to discharge.  Vital signs and laboratory values were stable and within normal limits.  The physical exam was benign and unchanged and all surgical wounds were examined.  Discharge instructions were explained and all questions answered.  Subjective  No acute events overnight. Pain Controlled. No fever or chills.  Objective No data found.  No intake/output data recorded.  General Appearance:        No acute distress Lungs:                 Normal work of breathing on room air Heart:                             Regular rate and rhythm Abdomen:                       Soft, non-tender, non-distended. JP drain site dressed appropriately. Incisions clean dry and intact, dermabond in place.  Extremities:                  Warm and well perfused. No edema.     Hospital Course:  The patient underwent right partial nephrectomy on 10/03/2016.  The patient tolerated the procedure well, was extubated in the OR, and afterwards was taken to the PACU for routine post-surgical care. When stable the patient was transferred to the floor.     The patient did well postoperatively.  The patient's diet was slowly advanced and at the time of discharge was tolerating a regular diet. This was slow, as patient did not pass gas until POD1. After suppositories followed by an enema, patient had return of bowel function and was discharged passing gas and comfortable  without nausea or distention. JP drain was removed on POD2 after putting out minimal serosanguinous fluid overnight without complication. Foley catheter was removed on POD1, and patient was voiding volitionally at time of discharge. The patient was discharged home 2 Days Post-Op, at which point was tolerating a regular solid diet, was able to void spontaneously, have adequate pain control with P.O. pain medication, and could ambulate without difficulty. The patient will follow up with [place] for post op check.   Condition at Discharge: Improved  Discharge Medications:  Allergies as of 10/05/2016      Reactions   Pravastatin Other (See Comments)   Joint pain   Zestril [lisinopril] Other (See Comments)   Gout   ( Zestril) gout   Oxycodone Other (See Comments)   Causes agitation       Medication List    STOP taking these medications   folic acid 1 MG tablet Commonly known as:  FOLVITE   ibuprofen 200 MG tablet Commonly known as:  ADVIL,MOTRIN   multivitamin with minerals tablet   naproxen sodium 220 MG tablet Commonly known as:  ANAPROX   Vitamin D (Ergocalciferol) 50000 units Caps capsule Commonly known as:  DRISDOL     TAKE these  medications   allopurinol 300 MG tablet Commonly known as:  ZYLOPRIM Take 300 mg by mouth daily.   amLODipine 10 MG tablet Commonly known as:  NORVASC Take 5 mg by mouth daily.   furosemide 20 MG tablet Commonly known as:  LASIX Take 20 mg by mouth 2 (two) times daily.   HYDROcodone-acetaminophen 5-325 MG tablet Commonly known as:  NORCO Take 1-2 tablets by mouth every 6 (six) hours as needed for moderate pain or severe pain.   labetalol 300 MG tablet Commonly known as:  NORMODYNE Take 300 mg by mouth 2 (two) times daily.   losartan 100 MG tablet Commonly known as:  COZAAR Take 100 mg by mouth daily.   methotrexate 2.5 MG tablet Commonly known as:  RHEUMATREX Take 25 mg by mouth every 7 (seven) days. Pt takes 10 tablets for 25 mg on  Wednesdays   tetrahydrozoline 0.05 % ophthalmic solution Place 1 drop into both eyes daily.       Pending Test Results: Pathology   Discharge Instructions:  Discharge Instructions    Discharge instructions    Complete by:  As directed

## 2016-10-05 NOTE — Progress Notes (Signed)
JP site continues to drain and dressing had to be changed overnight.  Will continue to monitor patient.

## 2016-10-05 NOTE — Care Management Note (Signed)
Case Management Note  Patient Details  Name: Devin [name] MRN: [id number] Date of Birth: 12-12-1940  Subjective/Objective:                    Action/Plan:d/c home.   Expected Discharge Date:  10/05/16               Expected Discharge Plan:  Home/Self Care  In-House Referral:     Discharge planning Services  CM Consult  Post Acute Care Choice:    Choice offered to:     DME Arranged:    DME Agency:     HH Arranged:    HH Agency:     Status of Service:  Completed, signed off  If discussed at [employer] of Stay Meetings, dates discussed:    Additional Comments:  Dessa Phi, RN 10/05/2016, 1:52 PM

## 2016-10-21 DIAGNOSIS — C641 Malignant neoplasm of right kidney, except renal pelvis: Secondary | ICD-10-CM | POA: Diagnosis not present

## 2016-11-23 DIAGNOSIS — H25813 Combined forms of age-related cataract, bilateral: Secondary | ICD-10-CM | POA: Diagnosis not present

## 2016-11-23 DIAGNOSIS — D2311 Other benign neoplasm of skin of right eyelid, including canthus: Secondary | ICD-10-CM | POA: Diagnosis not present

## 2016-11-23 DIAGNOSIS — H35373 Puckering of macula, bilateral: Secondary | ICD-10-CM | POA: Diagnosis not present

## 2016-11-23 DIAGNOSIS — H43813 Vitreous degeneration, bilateral: Secondary | ICD-10-CM | POA: Diagnosis not present

## 2016-11-23 DIAGNOSIS — H527 Unspecified disorder of refraction: Secondary | ICD-10-CM | POA: Diagnosis not present

## 2016-11-25 DIAGNOSIS — M1009 Idiopathic gout, multiple sites: Secondary | ICD-10-CM | POA: Diagnosis not present

## 2016-11-25 DIAGNOSIS — Z125 Encounter for screening for malignant neoplasm of prostate: Secondary | ICD-10-CM | POA: Diagnosis not present

## 2016-11-25 DIAGNOSIS — R7303 Prediabetes: Secondary | ICD-10-CM | POA: Diagnosis not present

## 2016-11-25 DIAGNOSIS — E559 Vitamin D deficiency, unspecified: Secondary | ICD-10-CM | POA: Diagnosis not present

## 2016-11-25 DIAGNOSIS — Z1159 Encounter for screening for other viral diseases: Secondary | ICD-10-CM | POA: Diagnosis not present

## 2016-11-25 DIAGNOSIS — I10 Essential (primary) hypertension: Secondary | ICD-10-CM | POA: Diagnosis not present

## 2016-11-25 DIAGNOSIS — E7800 Pure hypercholesterolemia, unspecified: Secondary | ICD-10-CM | POA: Diagnosis not present

## 2016-11-28 DIAGNOSIS — Z0000 Encounter for general adult medical examination without abnormal findings: Secondary | ICD-10-CM | POA: Diagnosis not present

## 2016-11-28 DIAGNOSIS — E559 Vitamin D deficiency, unspecified: Secondary | ICD-10-CM | POA: Diagnosis not present

## 2016-11-28 DIAGNOSIS — I10 Essential (primary) hypertension: Secondary | ICD-10-CM | POA: Diagnosis not present

## 2016-11-28 DIAGNOSIS — M0609 Rheumatoid arthritis without rheumatoid factor, multiple sites: Secondary | ICD-10-CM | POA: Diagnosis not present

## 2016-11-28 DIAGNOSIS — E7800 Pure hypercholesterolemia, unspecified: Secondary | ICD-10-CM | POA: Diagnosis not present

## 2016-11-28 DIAGNOSIS — E669 Obesity, unspecified: Secondary | ICD-10-CM | POA: Diagnosis not present

## 2016-11-28 DIAGNOSIS — M1009 Idiopathic gout, multiple sites: Secondary | ICD-10-CM | POA: Diagnosis not present

## 2016-11-28 DIAGNOSIS — R7303 Prediabetes: Secondary | ICD-10-CM | POA: Diagnosis not present

## 2016-11-28 DIAGNOSIS — N183 Chronic kidney disease, stage 3 (moderate): Secondary | ICD-10-CM | POA: Diagnosis not present

## 2016-12-27 DIAGNOSIS — M0609 Rheumatoid arthritis without rheumatoid factor, multiple sites: Secondary | ICD-10-CM | POA: Diagnosis not present

## 2016-12-27 DIAGNOSIS — M65342 Trigger finger, left ring finger: Secondary | ICD-10-CM | POA: Diagnosis not present

## 2016-12-27 DIAGNOSIS — Z6826 Body mass index (BMI) 26.0-26.9, adult: Secondary | ICD-10-CM | POA: Diagnosis not present

## 2016-12-27 DIAGNOSIS — M1009 Idiopathic gout, multiple sites: Secondary | ICD-10-CM | POA: Diagnosis not present

## 2016-12-27 DIAGNOSIS — Z79899 Other long term (current) drug therapy: Secondary | ICD-10-CM | POA: Diagnosis not present

## 2016-12-27 DIAGNOSIS — E663 Overweight: Secondary | ICD-10-CM | POA: Diagnosis not present

## 2016-12-27 DIAGNOSIS — M170 Bilateral primary osteoarthritis of knee: Secondary | ICD-10-CM | POA: Diagnosis not present

## 2017-01-05 DIAGNOSIS — Z23 Encounter for immunization: Secondary | ICD-10-CM | POA: Diagnosis not present

## 2017-03-29 DIAGNOSIS — M79645 Pain in left finger(s): Secondary | ICD-10-CM | POA: Diagnosis not present

## 2017-03-29 DIAGNOSIS — C641 Malignant neoplasm of right kidney, except renal pelvis: Secondary | ICD-10-CM | POA: Diagnosis not present

## 2017-03-29 DIAGNOSIS — M0609 Rheumatoid arthritis without rheumatoid factor, multiple sites: Secondary | ICD-10-CM | POA: Diagnosis not present

## 2017-03-29 DIAGNOSIS — E663 Overweight: Secondary | ICD-10-CM | POA: Diagnosis not present

## 2017-03-29 DIAGNOSIS — Z6827 Body mass index (BMI) 27.0-27.9, adult: Secondary | ICD-10-CM | POA: Diagnosis not present

## 2017-03-29 DIAGNOSIS — M65342 Trigger finger, left ring finger: Secondary | ICD-10-CM | POA: Diagnosis not present

## 2017-03-29 DIAGNOSIS — M170 Bilateral primary osteoarthritis of knee: Secondary | ICD-10-CM | POA: Diagnosis not present

## 2017-03-29 DIAGNOSIS — Z79899 Other long term (current) drug therapy: Secondary | ICD-10-CM | POA: Diagnosis not present

## 2017-03-29 DIAGNOSIS — M1009 Idiopathic gout, multiple sites: Secondary | ICD-10-CM | POA: Diagnosis not present

## 2017-05-03 ENCOUNTER — Ambulatory Visit (HOSPITAL_COMMUNITY)
Admission: RE | Admit: 2017-05-03 | Discharge: 2017-05-03 | Disposition: A | Discharge status: Home / Self Care | Payer: Medicare HMO | Source: Ambulatory Visit | Attending: Urology | Admitting: Urology

## 2017-05-03 ENCOUNTER — Other Ambulatory Visit (HOSPITAL_COMMUNITY): Payer: Self-pay | Admitting: Urology

## 2017-05-03 DIAGNOSIS — J984 Other disorders of lung: Secondary | ICD-10-CM | POA: Diagnosis not present

## 2017-05-03 DIAGNOSIS — C641 Malignant neoplasm of right kidney, except renal pelvis: Secondary | ICD-10-CM

## 2017-05-09 DIAGNOSIS — Z85528 Personal history of other malignant neoplasm of kidney: Secondary | ICD-10-CM | POA: Diagnosis not present

## 2017-05-09 DIAGNOSIS — M0609 Rheumatoid arthritis without rheumatoid factor, multiple sites: Secondary | ICD-10-CM | POA: Diagnosis not present

## 2017-05-30 DIAGNOSIS — M0609 Rheumatoid arthritis without rheumatoid factor, multiple sites: Secondary | ICD-10-CM | POA: Diagnosis not present

## 2017-05-30 DIAGNOSIS — C641 Malignant neoplasm of right kidney, except renal pelvis: Secondary | ICD-10-CM | POA: Diagnosis not present

## 2017-05-30 DIAGNOSIS — M79645 Pain in left finger(s): Secondary | ICD-10-CM | POA: Diagnosis not present

## 2017-05-30 DIAGNOSIS — M170 Bilateral primary osteoarthritis of knee: Secondary | ICD-10-CM | POA: Diagnosis not present

## 2017-05-30 DIAGNOSIS — Z6827 Body mass index (BMI) 27.0-27.9, adult: Secondary | ICD-10-CM | POA: Diagnosis not present

## 2017-05-30 DIAGNOSIS — E663 Overweight: Secondary | ICD-10-CM | POA: Diagnosis not present

## 2017-05-30 DIAGNOSIS — M65342 Trigger finger, left ring finger: Secondary | ICD-10-CM | POA: Diagnosis not present

## 2017-05-30 DIAGNOSIS — M1009 Idiopathic gout, multiple sites: Secondary | ICD-10-CM | POA: Diagnosis not present

## 2017-05-30 DIAGNOSIS — Z79899 Other long term (current) drug therapy: Secondary | ICD-10-CM | POA: Diagnosis not present

## 2017-05-31 DIAGNOSIS — E669 Obesity, unspecified: Secondary | ICD-10-CM | POA: Diagnosis not present

## 2017-05-31 DIAGNOSIS — E7800 Pure hypercholesterolemia, unspecified: Secondary | ICD-10-CM | POA: Diagnosis not present

## 2017-05-31 DIAGNOSIS — M0609 Rheumatoid arthritis without rheumatoid factor, multiple sites: Secondary | ICD-10-CM | POA: Diagnosis not present

## 2017-05-31 DIAGNOSIS — I10 Essential (primary) hypertension: Secondary | ICD-10-CM | POA: Diagnosis not present

## 2017-05-31 DIAGNOSIS — M1009 Idiopathic gout, multiple sites: Secondary | ICD-10-CM | POA: Diagnosis not present

## 2017-05-31 DIAGNOSIS — I712 Thoracic aortic aneurysm, without rupture: Secondary | ICD-10-CM | POA: Diagnosis not present

## 2017-05-31 DIAGNOSIS — C641 Malignant neoplasm of right kidney, except renal pelvis: Secondary | ICD-10-CM | POA: Diagnosis not present

## 2017-05-31 DIAGNOSIS — R7303 Prediabetes: Secondary | ICD-10-CM | POA: Diagnosis not present

## 2017-06-06 ENCOUNTER — Other Ambulatory Visit: Payer: Self-pay | Admitting: Family Medicine

## 2017-06-06 DIAGNOSIS — I712 Thoracic aortic aneurysm, without rupture, unspecified: Secondary | ICD-10-CM

## 2017-06-16 ENCOUNTER — Ambulatory Visit
Admission: RE | Admit: 2017-06-16 | Discharge: 2017-06-16 | Disposition: A | Discharge status: Home / Self Care | Payer: Medicare HMO | Source: Ambulatory Visit | Attending: Family Medicine | Admitting: Family Medicine

## 2017-06-16 DIAGNOSIS — I712 Thoracic aortic aneurysm, without rupture, unspecified: Secondary | ICD-10-CM

## 2017-06-16 MED ORDER — IOPAMIDOL (ISOVUE-370) INJECTION 76%
75.0000 mL | Freq: Once | INTRAVENOUS | Status: AC | PRN
  Administered 2017-06-16: 75 mL via INTRAVENOUS

## 2017-06-30 DIAGNOSIS — M0609 Rheumatoid arthritis without rheumatoid factor, multiple sites: Secondary | ICD-10-CM | POA: Diagnosis not present

## 2017-07-31 DIAGNOSIS — M1009 Idiopathic gout, multiple sites: Secondary | ICD-10-CM | POA: Diagnosis not present

## 2017-07-31 DIAGNOSIS — Z79899 Other long term (current) drug therapy: Secondary | ICD-10-CM | POA: Diagnosis not present

## 2017-07-31 DIAGNOSIS — Z6827 Body mass index (BMI) 27.0-27.9, adult: Secondary | ICD-10-CM | POA: Diagnosis not present

## 2017-07-31 DIAGNOSIS — M170 Bilateral primary osteoarthritis of knee: Secondary | ICD-10-CM | POA: Diagnosis not present

## 2017-07-31 DIAGNOSIS — E663 Overweight: Secondary | ICD-10-CM | POA: Diagnosis not present

## 2017-07-31 DIAGNOSIS — M65342 Trigger finger, left ring finger: Secondary | ICD-10-CM | POA: Diagnosis not present

## 2017-07-31 DIAGNOSIS — M0609 Rheumatoid arthritis without rheumatoid factor, multiple sites: Secondary | ICD-10-CM | POA: Diagnosis not present

## 2017-07-31 DIAGNOSIS — C641 Malignant neoplasm of right kidney, except renal pelvis: Secondary | ICD-10-CM | POA: Diagnosis not present

## 2017-07-31 DIAGNOSIS — M79645 Pain in left finger(s): Secondary | ICD-10-CM | POA: Diagnosis not present

## 2017-08-22 DIAGNOSIS — Z5181 Encounter for therapeutic drug level monitoring: Secondary | ICD-10-CM | POA: Diagnosis not present

## 2017-08-22 DIAGNOSIS — E7800 Pure hypercholesterolemia, unspecified: Secondary | ICD-10-CM | POA: Diagnosis not present

## 2017-09-28 DIAGNOSIS — Z79899 Other long term (current) drug therapy: Secondary | ICD-10-CM | POA: Diagnosis not present

## 2017-09-28 DIAGNOSIS — E663 Overweight: Secondary | ICD-10-CM | POA: Diagnosis not present

## 2017-09-28 DIAGNOSIS — Z6827 Body mass index (BMI) 27.0-27.9, adult: Secondary | ICD-10-CM | POA: Diagnosis not present

## 2017-09-28 DIAGNOSIS — M65341 Trigger finger, right ring finger: Secondary | ICD-10-CM | POA: Diagnosis not present

## 2017-09-28 DIAGNOSIS — C641 Malignant neoplasm of right kidney, except renal pelvis: Secondary | ICD-10-CM | POA: Diagnosis not present

## 2017-09-28 DIAGNOSIS — M0609 Rheumatoid arthritis without rheumatoid factor, multiple sites: Secondary | ICD-10-CM | POA: Diagnosis not present

## 2017-09-28 DIAGNOSIS — M170 Bilateral primary osteoarthritis of knee: Secondary | ICD-10-CM | POA: Diagnosis not present

## 2017-09-28 DIAGNOSIS — M79645 Pain in left finger(s): Secondary | ICD-10-CM | POA: Diagnosis not present

## 2017-09-28 DIAGNOSIS — M1009 Idiopathic gout, multiple sites: Secondary | ICD-10-CM | POA: Diagnosis not present

## 2017-11-29 DIAGNOSIS — Z6826 Body mass index (BMI) 26.0-26.9, adult: Secondary | ICD-10-CM | POA: Diagnosis not present

## 2017-11-29 DIAGNOSIS — E663 Overweight: Secondary | ICD-10-CM | POA: Diagnosis not present

## 2017-11-29 DIAGNOSIS — M1009 Idiopathic gout, multiple sites: Secondary | ICD-10-CM | POA: Diagnosis not present

## 2017-11-29 DIAGNOSIS — M170 Bilateral primary osteoarthritis of knee: Secondary | ICD-10-CM | POA: Diagnosis not present

## 2017-11-29 DIAGNOSIS — M0609 Rheumatoid arthritis without rheumatoid factor, multiple sites: Secondary | ICD-10-CM | POA: Diagnosis not present

## 2017-11-29 DIAGNOSIS — Z79899 Other long term (current) drug therapy: Secondary | ICD-10-CM | POA: Diagnosis not present

## 2017-12-04 DIAGNOSIS — Z125 Encounter for screening for malignant neoplasm of prostate: Secondary | ICD-10-CM | POA: Diagnosis not present

## 2017-12-04 DIAGNOSIS — Z23 Encounter for immunization: Secondary | ICD-10-CM | POA: Diagnosis not present

## 2017-12-04 DIAGNOSIS — R7309 Other abnormal glucose: Secondary | ICD-10-CM | POA: Diagnosis not present

## 2017-12-04 DIAGNOSIS — I10 Essential (primary) hypertension: Secondary | ICD-10-CM | POA: Diagnosis not present

## 2017-12-04 DIAGNOSIS — Z0000 Encounter for general adult medical examination without abnormal findings: Secondary | ICD-10-CM | POA: Diagnosis not present

## 2017-12-04 DIAGNOSIS — M1009 Idiopathic gout, multiple sites: Secondary | ICD-10-CM | POA: Diagnosis not present

## 2017-12-04 DIAGNOSIS — E7800 Pure hypercholesterolemia, unspecified: Secondary | ICD-10-CM | POA: Diagnosis not present

## 2017-12-04 DIAGNOSIS — E559 Vitamin D deficiency, unspecified: Secondary | ICD-10-CM | POA: Diagnosis not present

## 2017-12-08 ENCOUNTER — Other Ambulatory Visit (HOSPITAL_COMMUNITY): Payer: Self-pay | Admitting: Adult Health

## 2017-12-08 ENCOUNTER — Ambulatory Visit (HOSPITAL_COMMUNITY)
Admission: RE | Admit: 2017-12-08 | Discharge: 2017-12-08 | Disposition: A | Discharge status: Home / Self Care | Payer: Medicare HMO | Source: Ambulatory Visit | Attending: Adult Health | Admitting: Adult Health

## 2017-12-08 DIAGNOSIS — C641 Malignant neoplasm of right kidney, except renal pelvis: Secondary | ICD-10-CM | POA: Diagnosis not present

## 2017-12-08 DIAGNOSIS — Z905 Acquired absence of kidney: Secondary | ICD-10-CM | POA: Insufficient documentation

## 2017-12-08 DIAGNOSIS — N200 Calculus of kidney: Secondary | ICD-10-CM | POA: Diagnosis not present

## 2017-12-08 DIAGNOSIS — Z87891 Personal history of nicotine dependence: Secondary | ICD-10-CM | POA: Insufficient documentation

## 2017-12-08 DIAGNOSIS — Z85528 Personal history of other malignant neoplasm of kidney: Secondary | ICD-10-CM

## 2017-12-08 DIAGNOSIS — I10 Essential (primary) hypertension: Secondary | ICD-10-CM | POA: Insufficient documentation

## 2017-12-15 DIAGNOSIS — Z85528 Personal history of other malignant neoplasm of kidney: Secondary | ICD-10-CM | POA: Diagnosis not present

## 2017-12-27 DIAGNOSIS — M0609 Rheumatoid arthritis without rheumatoid factor, multiple sites: Secondary | ICD-10-CM | POA: Diagnosis not present

## 2018-01-26 DIAGNOSIS — Z79899 Other long term (current) drug therapy: Secondary | ICD-10-CM | POA: Diagnosis not present

## 2018-01-26 DIAGNOSIS — M0609 Rheumatoid arthritis without rheumatoid factor, multiple sites: Secondary | ICD-10-CM | POA: Diagnosis not present

## 2018-02-05 DIAGNOSIS — H527 Unspecified disorder of refraction: Secondary | ICD-10-CM | POA: Diagnosis not present

## 2018-02-05 DIAGNOSIS — D23111 Other benign neoplasm of skin of right upper eyelid, including canthus: Secondary | ICD-10-CM | POA: Diagnosis not present

## 2018-02-05 DIAGNOSIS — H35373 Puckering of macula, bilateral: Secondary | ICD-10-CM | POA: Diagnosis not present

## 2018-02-05 DIAGNOSIS — H25813 Combined forms of age-related cataract, bilateral: Secondary | ICD-10-CM | POA: Diagnosis not present

## 2018-02-05 DIAGNOSIS — H43813 Vitreous degeneration, bilateral: Secondary | ICD-10-CM | POA: Diagnosis not present

## 2018-02-27 DIAGNOSIS — Z86010 Personal history of colonic polyps: Secondary | ICD-10-CM | POA: Diagnosis not present

## 2018-02-27 DIAGNOSIS — D123 Benign neoplasm of transverse colon: Secondary | ICD-10-CM | POA: Diagnosis not present

## 2018-02-28 DIAGNOSIS — M1009 Idiopathic gout, multiple sites: Secondary | ICD-10-CM | POA: Diagnosis not present

## 2018-02-28 DIAGNOSIS — E663 Overweight: Secondary | ICD-10-CM | POA: Diagnosis not present

## 2018-02-28 DIAGNOSIS — M0609 Rheumatoid arthritis without rheumatoid factor, multiple sites: Secondary | ICD-10-CM | POA: Diagnosis not present

## 2018-02-28 DIAGNOSIS — Z79899 Other long term (current) drug therapy: Secondary | ICD-10-CM | POA: Diagnosis not present

## 2018-02-28 DIAGNOSIS — Z6826 Body mass index (BMI) 26.0-26.9, adult: Secondary | ICD-10-CM | POA: Diagnosis not present

## 2018-02-28 DIAGNOSIS — M170 Bilateral primary osteoarthritis of knee: Secondary | ICD-10-CM | POA: Diagnosis not present

## 2018-03-02 DIAGNOSIS — D123 Benign neoplasm of transverse colon: Secondary | ICD-10-CM | POA: Diagnosis not present

## 2018-03-07 DIAGNOSIS — I712 Thoracic aortic aneurysm, without rupture: Secondary | ICD-10-CM | POA: Diagnosis not present

## 2018-03-07 DIAGNOSIS — Z23 Encounter for immunization: Secondary | ICD-10-CM | POA: Diagnosis not present

## 2018-03-07 DIAGNOSIS — E119 Type 2 diabetes mellitus without complications: Secondary | ICD-10-CM | POA: Diagnosis not present

## 2018-03-07 DIAGNOSIS — I10 Essential (primary) hypertension: Secondary | ICD-10-CM | POA: Diagnosis not present

## 2018-03-13 ENCOUNTER — Other Ambulatory Visit: Payer: Self-pay | Admitting: Family Medicine

## 2018-03-13 DIAGNOSIS — I712 Thoracic aortic aneurysm, without rupture, unspecified: Secondary | ICD-10-CM

## 2018-04-26 DIAGNOSIS — Z79899 Other long term (current) drug therapy: Secondary | ICD-10-CM | POA: Diagnosis not present

## 2018-04-26 DIAGNOSIS — M1009 Idiopathic gout, multiple sites: Secondary | ICD-10-CM | POA: Diagnosis not present

## 2018-04-26 DIAGNOSIS — E663 Overweight: Secondary | ICD-10-CM | POA: Diagnosis not present

## 2018-04-26 DIAGNOSIS — M170 Bilateral primary osteoarthritis of knee: Secondary | ICD-10-CM | POA: Diagnosis not present

## 2018-04-26 DIAGNOSIS — Z6827 Body mass index (BMI) 27.0-27.9, adult: Secondary | ICD-10-CM | POA: Diagnosis not present

## 2018-04-26 DIAGNOSIS — M0609 Rheumatoid arthritis without rheumatoid factor, multiple sites: Secondary | ICD-10-CM | POA: Diagnosis not present

## 2018-05-28 ENCOUNTER — Ambulatory Visit
Admission: RE | Admit: 2018-05-28 | Discharge: 2018-05-28 | Disposition: A | Discharge status: Home / Self Care | Payer: Medicare HMO | Source: Ambulatory Visit | Attending: Family Medicine | Admitting: Family Medicine

## 2018-05-28 DIAGNOSIS — I712 Thoracic aortic aneurysm, without rupture, unspecified: Secondary | ICD-10-CM

## 2018-05-28 MED ORDER — IOPAMIDOL (ISOVUE-370) INJECTION 76%
75.0000 mL | Freq: Once | INTRAVENOUS | Status: AC | PRN
  Administered 2018-05-28: 75 mL via INTRAVENOUS

## 2018-07-26 DIAGNOSIS — Z79899 Other long term (current) drug therapy: Secondary | ICD-10-CM | POA: Diagnosis not present

## 2018-07-26 DIAGNOSIS — M0609 Rheumatoid arthritis without rheumatoid factor, multiple sites: Secondary | ICD-10-CM | POA: Diagnosis not present

## 2018-09-11 DIAGNOSIS — Z86711 Personal history of pulmonary embolism: Secondary | ICD-10-CM | POA: Diagnosis not present

## 2018-09-11 DIAGNOSIS — I10 Essential (primary) hypertension: Secondary | ICD-10-CM | POA: Diagnosis not present

## 2018-09-11 DIAGNOSIS — D509 Iron deficiency anemia, unspecified: Secondary | ICD-10-CM | POA: Diagnosis not present

## 2018-09-11 DIAGNOSIS — R7303 Prediabetes: Secondary | ICD-10-CM | POA: Diagnosis not present

## 2018-09-11 DIAGNOSIS — E7800 Pure hypercholesterolemia, unspecified: Secondary | ICD-10-CM | POA: Diagnosis not present

## 2018-09-11 DIAGNOSIS — M1009 Idiopathic gout, multiple sites: Secondary | ICD-10-CM | POA: Diagnosis not present

## 2018-09-11 DIAGNOSIS — M0609 Rheumatoid arthritis without rheumatoid factor, multiple sites: Secondary | ICD-10-CM | POA: Diagnosis not present

## 2018-09-11 DIAGNOSIS — I712 Thoracic aortic aneurysm, without rupture: Secondary | ICD-10-CM | POA: Diagnosis not present

## 2018-09-11 DIAGNOSIS — N183 Chronic kidney disease, stage 3 (moderate): Secondary | ICD-10-CM | POA: Diagnosis not present

## 2018-10-29 DIAGNOSIS — M0609 Rheumatoid arthritis without rheumatoid factor, multiple sites: Secondary | ICD-10-CM | POA: Diagnosis not present

## 2018-10-29 DIAGNOSIS — Z79899 Other long term (current) drug therapy: Secondary | ICD-10-CM | POA: Diagnosis not present

## 2018-10-29 DIAGNOSIS — M170 Bilateral primary osteoarthritis of knee: Secondary | ICD-10-CM | POA: Diagnosis not present

## 2018-10-29 DIAGNOSIS — M1009 Idiopathic gout, multiple sites: Secondary | ICD-10-CM | POA: Diagnosis not present

## 2018-11-01 DIAGNOSIS — K429 Umbilical hernia without obstruction or gangrene: Secondary | ICD-10-CM | POA: Diagnosis not present

## 2018-11-01 DIAGNOSIS — L02411 Cutaneous abscess of right axilla: Secondary | ICD-10-CM | POA: Diagnosis not present

## 2018-11-16 ENCOUNTER — Other Ambulatory Visit (HOSPITAL_COMMUNITY): Payer: Self-pay | Admitting: Urology

## 2018-11-16 ENCOUNTER — Other Ambulatory Visit: Payer: Self-pay

## 2018-11-16 ENCOUNTER — Ambulatory Visit (HOSPITAL_COMMUNITY)
Admission: RE | Admit: 2018-11-16 | Discharge: 2018-11-16 | Disposition: A | Discharge status: Home / Self Care | Payer: Medicare HMO | Source: Ambulatory Visit | Attending: Urology | Admitting: Urology

## 2018-11-16 DIAGNOSIS — Z85528 Personal history of other malignant neoplasm of kidney: Secondary | ICD-10-CM | POA: Insufficient documentation

## 2018-11-16 DIAGNOSIS — Z8553 Personal history of malignant neoplasm of renal pelvis: Secondary | ICD-10-CM | POA: Diagnosis not present

## 2018-11-23 DIAGNOSIS — Z85528 Personal history of other malignant neoplasm of kidney: Secondary | ICD-10-CM | POA: Diagnosis not present

## 2018-11-30 DIAGNOSIS — E119 Type 2 diabetes mellitus without complications: Secondary | ICD-10-CM | POA: Diagnosis not present

## 2018-11-30 DIAGNOSIS — D509 Iron deficiency anemia, unspecified: Secondary | ICD-10-CM | POA: Diagnosis not present

## 2018-11-30 DIAGNOSIS — R7303 Prediabetes: Secondary | ICD-10-CM | POA: Diagnosis not present

## 2018-11-30 DIAGNOSIS — I10 Essential (primary) hypertension: Secondary | ICD-10-CM | POA: Diagnosis not present

## 2018-12-05 DIAGNOSIS — I712 Thoracic aortic aneurysm, without rupture: Secondary | ICD-10-CM | POA: Diagnosis not present

## 2018-12-05 DIAGNOSIS — Z0000 Encounter for general adult medical examination without abnormal findings: Secondary | ICD-10-CM | POA: Diagnosis not present

## 2018-12-05 DIAGNOSIS — C641 Malignant neoplasm of right kidney, except renal pelvis: Secondary | ICD-10-CM | POA: Diagnosis not present

## 2018-12-05 DIAGNOSIS — E119 Type 2 diabetes mellitus without complications: Secondary | ICD-10-CM | POA: Diagnosis not present

## 2018-12-05 DIAGNOSIS — N183 Chronic kidney disease, stage 3 (moderate): Secondary | ICD-10-CM | POA: Diagnosis not present

## 2018-12-05 DIAGNOSIS — I10 Essential (primary) hypertension: Secondary | ICD-10-CM | POA: Diagnosis not present

## 2018-12-06 DIAGNOSIS — Z23 Encounter for immunization: Secondary | ICD-10-CM | POA: Diagnosis not present

## 2018-12-07 DIAGNOSIS — N200 Calculus of kidney: Secondary | ICD-10-CM | POA: Diagnosis not present

## 2018-12-07 DIAGNOSIS — N281 Cyst of kidney, acquired: Secondary | ICD-10-CM | POA: Diagnosis not present

## 2018-12-07 DIAGNOSIS — Z85528 Personal history of other malignant neoplasm of kidney: Secondary | ICD-10-CM | POA: Diagnosis not present

## 2019-02-04 DIAGNOSIS — M1009 Idiopathic gout, multiple sites: Secondary | ICD-10-CM | POA: Diagnosis not present

## 2019-02-04 DIAGNOSIS — Z79899 Other long term (current) drug therapy: Secondary | ICD-10-CM | POA: Diagnosis not present

## 2019-02-04 DIAGNOSIS — E663 Overweight: Secondary | ICD-10-CM | POA: Diagnosis not present

## 2019-02-04 DIAGNOSIS — M65321 Trigger finger, right index finger: Secondary | ICD-10-CM | POA: Diagnosis not present

## 2019-02-04 DIAGNOSIS — M170 Bilateral primary osteoarthritis of knee: Secondary | ICD-10-CM | POA: Diagnosis not present

## 2019-02-04 DIAGNOSIS — Z6827 Body mass index (BMI) 27.0-27.9, adult: Secondary | ICD-10-CM | POA: Diagnosis not present

## 2019-02-04 DIAGNOSIS — M0609 Rheumatoid arthritis without rheumatoid factor, multiple sites: Secondary | ICD-10-CM | POA: Diagnosis not present

## 2019-02-06 DIAGNOSIS — D23111 Other benign neoplasm of skin of right upper eyelid, including canthus: Secondary | ICD-10-CM | POA: Diagnosis not present

## 2019-02-06 DIAGNOSIS — H35373 Puckering of macula, bilateral: Secondary | ICD-10-CM | POA: Diagnosis not present

## 2019-02-06 DIAGNOSIS — H25813 Combined forms of age-related cataract, bilateral: Secondary | ICD-10-CM | POA: Diagnosis not present

## 2019-02-06 DIAGNOSIS — H527 Unspecified disorder of refraction: Secondary | ICD-10-CM | POA: Diagnosis not present

## 2019-02-06 DIAGNOSIS — H43813 Vitreous degeneration, bilateral: Secondary | ICD-10-CM | POA: Diagnosis not present

## 2019-05-07 DIAGNOSIS — M170 Bilateral primary osteoarthritis of knee: Secondary | ICD-10-CM | POA: Diagnosis not present

## 2019-05-07 DIAGNOSIS — M0609 Rheumatoid arthritis without rheumatoid factor, multiple sites: Secondary | ICD-10-CM | POA: Diagnosis not present

## 2019-05-07 DIAGNOSIS — E663 Overweight: Secondary | ICD-10-CM | POA: Diagnosis not present

## 2019-05-07 DIAGNOSIS — Z6827 Body mass index (BMI) 27.0-27.9, adult: Secondary | ICD-10-CM | POA: Diagnosis not present

## 2019-05-07 DIAGNOSIS — M1009 Idiopathic gout, multiple sites: Secondary | ICD-10-CM | POA: Diagnosis not present

## 2019-05-07 DIAGNOSIS — Z79899 Other long term (current) drug therapy: Secondary | ICD-10-CM | POA: Diagnosis not present

## 2019-06-06 ENCOUNTER — Other Ambulatory Visit: Payer: Self-pay | Admitting: Family Medicine

## 2019-06-06 DIAGNOSIS — I712 Thoracic aortic aneurysm, without rupture, unspecified: Secondary | ICD-10-CM

## 2019-06-07 DIAGNOSIS — D691 Qualitative platelet defects: Secondary | ICD-10-CM | POA: Diagnosis not present

## 2019-06-20 ENCOUNTER — Other Ambulatory Visit: Payer: Self-pay

## 2019-06-20 ENCOUNTER — Ambulatory Visit
Admission: RE | Admit: 2019-06-20 | Discharge: 2019-06-20 | Disposition: A | Discharge status: Home / Self Care | Payer: Medicare HMO | Source: Ambulatory Visit | Attending: Family Medicine | Admitting: Family Medicine

## 2019-06-20 DIAGNOSIS — I712 Thoracic aortic aneurysm, without rupture, unspecified: Secondary | ICD-10-CM

## 2019-06-20 MED ORDER — IOPAMIDOL (ISOVUE-370) INJECTION 76%
75.0000 mL | Freq: Once | INTRAVENOUS | Status: AC | PRN
  Administered 2019-06-20: 50 mL via INTRAVENOUS

## 2019-07-22 DIAGNOSIS — K432 Incisional hernia without obstruction or gangrene: Secondary | ICD-10-CM | POA: Diagnosis not present

## 2019-08-05 DIAGNOSIS — M0609 Rheumatoid arthritis without rheumatoid factor, multiple sites: Secondary | ICD-10-CM | POA: Diagnosis not present

## 2019-09-04 DIAGNOSIS — Z7901 Long term (current) use of anticoagulants: Secondary | ICD-10-CM | POA: Diagnosis not present

## 2019-09-04 DIAGNOSIS — R410 Disorientation, unspecified: Secondary | ICD-10-CM | POA: Diagnosis not present

## 2019-09-04 DIAGNOSIS — M06861 Other specified rheumatoid arthritis, right knee: Secondary | ICD-10-CM | POA: Diagnosis not present

## 2019-09-04 DIAGNOSIS — Z87891 Personal history of nicotine dependence: Secondary | ICD-10-CM | POA: Diagnosis not present

## 2019-09-04 DIAGNOSIS — M1711 Unilateral primary osteoarthritis, right knee: Secondary | ICD-10-CM | POA: Diagnosis not present

## 2019-09-04 DIAGNOSIS — K430 Incisional hernia with obstruction, without gangrene: Secondary | ICD-10-CM | POA: Diagnosis not present

## 2019-09-04 DIAGNOSIS — Z888 Allergy status to other drugs, medicaments and biological substances status: Secondary | ICD-10-CM | POA: Diagnosis not present

## 2019-09-04 DIAGNOSIS — E785 Hyperlipidemia, unspecified: Secondary | ICD-10-CM | POA: Diagnosis not present

## 2019-09-04 DIAGNOSIS — K432 Incisional hernia without obstruction or gangrene: Secondary | ICD-10-CM | POA: Diagnosis not present

## 2019-09-04 DIAGNOSIS — M109 Gout, unspecified: Secondary | ICD-10-CM | POA: Diagnosis not present

## 2019-09-04 DIAGNOSIS — Z96651 Presence of right artificial knee joint: Secondary | ICD-10-CM | POA: Diagnosis not present

## 2019-09-04 DIAGNOSIS — I10 Essential (primary) hypertension: Secondary | ICD-10-CM | POA: Diagnosis not present

## 2019-09-04 DIAGNOSIS — M1990 Unspecified osteoarthritis, unspecified site: Secondary | ICD-10-CM | POA: Diagnosis not present

## 2019-09-05 DIAGNOSIS — Z7901 Long term (current) use of anticoagulants: Secondary | ICD-10-CM | POA: Diagnosis not present

## 2019-09-05 DIAGNOSIS — E785 Hyperlipidemia, unspecified: Secondary | ICD-10-CM | POA: Diagnosis not present

## 2019-09-05 DIAGNOSIS — Z888 Allergy status to other drugs, medicaments and biological substances status: Secondary | ICD-10-CM | POA: Diagnosis not present

## 2019-09-05 DIAGNOSIS — M109 Gout, unspecified: Secondary | ICD-10-CM | POA: Diagnosis not present

## 2019-09-05 DIAGNOSIS — M1990 Unspecified osteoarthritis, unspecified site: Secondary | ICD-10-CM | POA: Diagnosis not present

## 2019-09-05 DIAGNOSIS — K430 Incisional hernia with obstruction, without gangrene: Secondary | ICD-10-CM | POA: Diagnosis not present

## 2019-09-05 DIAGNOSIS — K432 Incisional hernia without obstruction or gangrene: Secondary | ICD-10-CM | POA: Diagnosis not present

## 2019-09-05 DIAGNOSIS — I10 Essential (primary) hypertension: Secondary | ICD-10-CM | POA: Diagnosis not present

## 2019-09-05 DIAGNOSIS — Z87891 Personal history of nicotine dependence: Secondary | ICD-10-CM | POA: Diagnosis not present

## 2019-09-05 DIAGNOSIS — R410 Disorientation, unspecified: Secondary | ICD-10-CM | POA: Diagnosis not present

## 2019-11-05 DIAGNOSIS — M0609 Rheumatoid arthritis without rheumatoid factor, multiple sites: Secondary | ICD-10-CM | POA: Diagnosis not present

## 2019-11-05 DIAGNOSIS — M1009 Idiopathic gout, multiple sites: Secondary | ICD-10-CM | POA: Diagnosis not present

## 2019-11-05 DIAGNOSIS — E663 Overweight: Secondary | ICD-10-CM | POA: Diagnosis not present

## 2019-11-05 DIAGNOSIS — M170 Bilateral primary osteoarthritis of knee: Secondary | ICD-10-CM | POA: Diagnosis not present

## 2019-11-05 DIAGNOSIS — Z79899 Other long term (current) drug therapy: Secondary | ICD-10-CM | POA: Diagnosis not present

## 2019-11-05 DIAGNOSIS — Z6825 Body mass index (BMI) 25.0-25.9, adult: Secondary | ICD-10-CM | POA: Diagnosis not present

## 2019-12-10 DIAGNOSIS — E119 Type 2 diabetes mellitus without complications: Secondary | ICD-10-CM | POA: Diagnosis not present

## 2019-12-10 DIAGNOSIS — E7800 Pure hypercholesterolemia, unspecified: Secondary | ICD-10-CM | POA: Diagnosis not present

## 2019-12-10 DIAGNOSIS — D509 Iron deficiency anemia, unspecified: Secondary | ICD-10-CM | POA: Diagnosis not present

## 2019-12-11 DIAGNOSIS — Z85528 Personal history of other malignant neoplasm of kidney: Secondary | ICD-10-CM | POA: Diagnosis not present

## 2019-12-12 DIAGNOSIS — N183 Chronic kidney disease, stage 3 unspecified: Secondary | ICD-10-CM | POA: Diagnosis not present

## 2019-12-12 DIAGNOSIS — C641 Malignant neoplasm of right kidney, except renal pelvis: Secondary | ICD-10-CM | POA: Diagnosis not present

## 2019-12-12 DIAGNOSIS — I700 Atherosclerosis of aorta: Secondary | ICD-10-CM | POA: Diagnosis not present

## 2019-12-12 DIAGNOSIS — Z0000 Encounter for general adult medical examination without abnormal findings: Secondary | ICD-10-CM | POA: Diagnosis not present

## 2019-12-12 DIAGNOSIS — Z23 Encounter for immunization: Secondary | ICD-10-CM | POA: Diagnosis not present

## 2019-12-12 DIAGNOSIS — I10 Essential (primary) hypertension: Secondary | ICD-10-CM | POA: Diagnosis not present

## 2019-12-12 DIAGNOSIS — E119 Type 2 diabetes mellitus without complications: Secondary | ICD-10-CM | POA: Diagnosis not present

## 2019-12-27 ENCOUNTER — Other Ambulatory Visit (HOSPITAL_COMMUNITY): Payer: Self-pay | Admitting: Nurse Practitioner

## 2019-12-27 ENCOUNTER — Ambulatory Visit (HOSPITAL_COMMUNITY)
Admission: RE | Admit: 2019-12-27 | Discharge: 2019-12-27 | Disposition: A | Discharge status: Home / Self Care | Payer: Medicare HMO | Source: Ambulatory Visit | Attending: Nurse Practitioner | Admitting: Nurse Practitioner

## 2019-12-27 ENCOUNTER — Other Ambulatory Visit: Payer: Self-pay

## 2019-12-27 DIAGNOSIS — N2889 Other specified disorders of kidney and ureter: Secondary | ICD-10-CM | POA: Diagnosis not present

## 2019-12-27 DIAGNOSIS — K639 Disease of intestine, unspecified: Secondary | ICD-10-CM | POA: Diagnosis not present

## 2019-12-27 DIAGNOSIS — C7951 Secondary malignant neoplasm of bone: Secondary | ICD-10-CM | POA: Diagnosis not present

## 2019-12-27 DIAGNOSIS — I700 Atherosclerosis of aorta: Secondary | ICD-10-CM | POA: Diagnosis not present

## 2019-12-27 DIAGNOSIS — Z85528 Personal history of other malignant neoplasm of kidney: Secondary | ICD-10-CM | POA: Diagnosis not present

## 2019-12-27 DIAGNOSIS — C649 Malignant neoplasm of unspecified kidney, except renal pelvis: Secondary | ICD-10-CM | POA: Diagnosis not present

## 2020-01-03 ENCOUNTER — Other Ambulatory Visit (HOSPITAL_COMMUNITY): Payer: Self-pay | Admitting: Nurse Practitioner

## 2020-01-03 DIAGNOSIS — S34119A Complete lesion of unspecified level of lumbar spinal cord, initial encounter: Secondary | ICD-10-CM

## 2020-01-03 DIAGNOSIS — Z85528 Personal history of other malignant neoplasm of kidney: Secondary | ICD-10-CM

## 2020-01-08 ENCOUNTER — Ambulatory Visit (HOSPITAL_COMMUNITY)
Admission: RE | Admit: 2020-01-08 | Discharge: 2020-01-08 | Disposition: A | Discharge status: Home / Self Care | Payer: Medicare HMO | Source: Ambulatory Visit | Attending: Nurse Practitioner | Admitting: Nurse Practitioner

## 2020-01-08 ENCOUNTER — Other Ambulatory Visit: Payer: Self-pay

## 2020-01-08 DIAGNOSIS — M48061 Spinal stenosis, lumbar region without neurogenic claudication: Secondary | ICD-10-CM | POA: Diagnosis not present

## 2020-01-08 DIAGNOSIS — Z85528 Personal history of other malignant neoplasm of kidney: Secondary | ICD-10-CM

## 2020-01-08 DIAGNOSIS — S34119A Complete lesion of unspecified level of lumbar spinal cord, initial encounter: Secondary | ICD-10-CM | POA: Diagnosis not present

## 2020-01-08 DIAGNOSIS — M5386 Other specified dorsopathies, lumbar region: Secondary | ICD-10-CM | POA: Diagnosis not present

## 2020-01-08 DIAGNOSIS — M47816 Spondylosis without myelopathy or radiculopathy, lumbar region: Secondary | ICD-10-CM | POA: Diagnosis not present

## 2020-01-08 MED ORDER — GADOBUTROL 1 MMOL/ML IV SOLN
10.0000 mL | Freq: Once | INTRAVENOUS | Status: AC | PRN
  Administered 2020-01-08: 9 mL via INTRAVENOUS

## 2020-01-10 ENCOUNTER — Telehealth: Payer: Self-pay | Admitting: Oncology

## 2020-01-10 NOTE — Telephone Encounter (Signed)
Received a new pt referral from Dr. 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 Mr. Colpitts has been scheduled to see Dr. Alen Blew on 11/2 at 11am. Appt date and time has been given tot he pt's wife. Aware to arrive 20 minutes early.

## 2020-01-21 ENCOUNTER — Inpatient Hospital Stay: Payer: Medicare HMO | Attending: Oncology | Admitting: Oncology

## 2020-01-21 ENCOUNTER — Other Ambulatory Visit: Payer: Self-pay

## 2020-01-21 VITALS — BP 151/83 | HR 78 | Temp 97.8°F | Resp 18 | Ht 70.0 in | Wt 184.5 lb

## 2020-01-21 DIAGNOSIS — M069 Rheumatoid arthritis, unspecified: Secondary | ICD-10-CM

## 2020-01-21 DIAGNOSIS — Z87891 Personal history of nicotine dependence: Secondary | ICD-10-CM | POA: Diagnosis not present

## 2020-01-21 DIAGNOSIS — Z808 Family history of malignant neoplasm of other organs or systems: Secondary | ICD-10-CM | POA: Diagnosis not present

## 2020-01-21 DIAGNOSIS — D49511 Neoplasm of unspecified behavior of right kidney: Secondary | ICD-10-CM

## 2020-01-21 DIAGNOSIS — C641 Malignant neoplasm of right kidney, except renal pelvis: Secondary | ICD-10-CM | POA: Diagnosis not present

## 2020-01-21 DIAGNOSIS — C801 Malignant (primary) neoplasm, unspecified: Secondary | ICD-10-CM | POA: Insufficient documentation

## 2020-01-21 DIAGNOSIS — C419 Malignant neoplasm of bone and articular cartilage, unspecified: Secondary | ICD-10-CM

## 2020-01-21 DIAGNOSIS — M899 Disorder of bone, unspecified: Secondary | ICD-10-CM | POA: Diagnosis not present

## 2020-01-21 NOTE — Progress Notes (Signed)
Reason for Devin request: Abnormal spinal lesions suspicious for malignancy.  HPI: I was asked by Dr. Alinda Money to evaluate Devin [name] for Devin evaluation of abnormal imaging studies.  Devin [name] is a 79 year old with a history of rheumatoid arthritis who was diagnosed with right kidney cancer in 2018.  At that time Devin [name] was found to have 4.1 x 3.6 exophytic lesion that was discovered incidentally.  Devin [name] underwent right robotic assisted laparoscopic partial nephrectomy completed on October 03, 2016 by Dr. Alinda Money.  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  Devin [name] remained on active surveillance with annual imaging studies.  CT scan of Devin abdomen and pelvis completed December 27, 2019 showed no evidence of malignancy in Devin right kidney but there is a new lytic lesion in Devin L5 vertebral body with superior endplate deformity compared to Devin most recent imaging studies in 2019.  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  No epidural extension was noted with possible pathological fracture superimposed on degenerative changes.  Based on these findings I was asked to discuss these findings and discuss management options.  Clinically, Devin [name] reports no complaints.  Devin [name] denies any bone pain, back pain or excessive fatigue or tiredness.  Devin [name] denies weight loss or appetite changes.  Devin [name] does report occasional shoulder pain associated with Devin [name] rheumatoid arthritis.  Devin [name] does not report any headaches, blurry vision, syncope or seizures. Does not report any fevers, chills or sweats.  Does not report any cough, wheezing or hemoptysis.  Does not report any chest pain, palpitation, orthopnea or leg edema.  Does not report any nausea, vomiting or abdominal pain.  Does not report any constipation or diarrhea.  Does not report any skeletal complaints.     Does not report frequency, urgency or hematuria.  Does not report any skin rashes or lesions. Does not report any heat or cold intolerance.  Does not report any lymphadenopathy or petechiae.  Does not report any anxiety or depression.  Remaining review of systems is negative.    Past Medical History:  Diagnosis Date  . Arthritis   . History of pulmonary embolism 04/2016  . Hypertension   :  Past Surgical History:  Procedure Laterality Date  . APPENDECTOMY    . DENTAL SURGERY  03/2016   all upper teeth pulled and dentures.   Marland Kitchen HERNIA REPAIR  1985&2001  . right knee replacement   04/2016  . right wrist plate due to fracture     . ROBOT ASSISTED LAPAROSCOPIC NEPHRECTOMY Right 10/03/2016   Procedure: XI ROBOTIC ASSISTED LAPAROSCOPIC  PARTIAL NEPHRECTOMY;  Surgeon: Raynelle Bring, MD;  Location: [facility];  Service: Urology;  Laterality: Right;  :   Current Outpatient Medications:  .  allopurinol (ZYLOPRIM) 300 MG tablet, Take 300 mg by mouth daily. , Disp: , Rfl:  .  amLODipine (NORVASC) 10 MG tablet, Take 5 mg by mouth daily., Disp: , Rfl:  .  furosemide (LASIX) 20 MG tablet, Take 20 mg by mouth 2 (two) times daily. , Disp: , Rfl:  .  HYDROcodone-acetaminophen (NORCO) 5-325 MG tablet, Take 1-2 tablets by mouth every 6 (six) hours as needed for moderate pain or severe pain., Disp: 30 tablet, Rfl: 0 .  labetalol (NORMODYNE) 300 MG tablet, Take 300 mg by mouth 2 (two) times daily. , Disp: , Rfl:  .  losartan (COZAAR)  100 MG tablet, Take 100 mg by mouth daily., Disp: , Rfl:  .  methotrexate (RHEUMATREX) 2.5 MG tablet, Take 25 mg by mouth every 7 (seven) days. Pt takes 10 tablets for 25 mg on Wednesdays, Disp: , Rfl:  .  tetrahydrozoline 0.05 % ophthalmic solution, Place 1 drop into both eyes daily., Disp: , Rfl: :  Allergies  Allergen Reactions  . Pravastatin Other (See Comments)    Joint pain  . Zestril [Lisinopril] Other (See Comments)    Gout   ( Zestril) gout  . Oxycodone Other (See  Comments)    Causes agitation   :  Family History  Problem Relation Age of Onset  . Cancer Mother        bone  :  Social History   Socioeconomic History  . Marital status: Married    Spouse name: Not on file  . Number of children: Not on file  . Years of education: Not on file  . Highest education level: Not on file  Occupational History  . Not on file  Tobacco Use  . Smoking status: Former Research scientist (life sciences)  . Smokeless tobacco: Former Systems developer    Quit date: 08/09/1985  Vaping Use  . Vaping Use: Never used  Substance and Sexual Activity  . Alcohol use: Yes    Comment: occ beer   . Drug use: No  . Sexual activity: Not on file  Other Topics Concern  . Not on file  Social History Narrative  . Not on file   Social Determinants of Health   Financial Resource Strain:   . Difficulty of Paying Living Expenses: Not on file  Food Insecurity:   . Worried About Charity fundraiser in Devin Last Year: Not on file  . Ran Out of Food in Devin Last Year: Not on file  Transportation Needs:   . Lack of Transportation (Medical): Not on file  . Lack of Transportation (Non-Medical): Not on file  Physical Activity:   . Days of Exercise per Week: Not on file  . Minutes of Exercise per Session: Not on file  Stress:   . Feeling of Stress : Not on file  Social Connections:   . Frequency of Communication with Friends and Family: Not on file  . Frequency of Social Gatherings with Friends and Family: Not on file  . Attends Religious Services: Not on file  . Active Member of Clubs or Organizations: Not on file  . Attends Archivist Meetings: Not on file  . Marital Status: Not on file  Intimate Partner Violence:   . Fear of Current or Ex-Partner: Not on file  . Emotionally Abused: Not on file  . Physically Abused: Not on file  . Sexually Abused: Not on file  :  Pertinent items are noted in HPI.  Exam: Blood pressure (!) 151/83, pulse 78, temperature 97.8 F (36.6 C), temperature source  Tympanic, resp. rate 18, height 5\' 10"  (1.778 m), weight 184 lb 8 oz (83.7 kg), SpO2 100 %.  ECOG 1 General appearance: alert and cooperative appeared without distress. Head: atraumatic without any abnormalities. Eyes: conjunctivae/corneas clear. PERRL.  Sclera anicteric. Throat: lips, mucosa, and tongue normal; without oral thrush or ulcers. Resp: clear to auscultation bilaterally without rhonchi, wheezes or dullness to percussion. Cardio: regular rate and rhythm, S1, S2 normal, no murmur, click, rub or gallop GI: soft, non-tender; bowel sounds normal; no masses,  no organomegaly Skin: Skin color, texture, turgor normal. No rashes or lesions Lymph nodes: Cervical,  supraclavicular, and axillary nodes normal. Neurologic: Grossly normal without any motor, sensory or deep tendon reflexes. Musculoskeletal: No joint deformity or effusion.  CBC    Component Value Date/Time   WBC 6.6 09/29/2016 1040   RBC 3.69 (L) 09/29/2016 1040   HGB 11.1 (L) 10/05/2016 0443   HCT 32.1 (L) 10/05/2016 0443   PLT 195 09/29/2016 1040   MCV 97.0 09/29/2016 1040   MCH 33.3 09/29/2016 1040   MCHC 34.4 09/29/2016 1040   RDW 15.1 09/29/2016 1040   LYMPHSABS 0.8 03/04/2014 1403   MONOABS 0.3 03/04/2014 1403   EOSABS 0.1 03/04/2014 1403   BASOSABS 0.0 03/04/2014 1403      Chemistry      Component Value Date/Time   NA 132 (L) 10/05/2016 0443   K 3.7 10/05/2016 0443   CL 98 (L) 10/05/2016 0443   CO2 25 10/05/2016 0443   BUN 16 10/05/2016 0443   CREATININE 1.25 (H) 10/05/2016 0443      Component Value Date/Time   CALCIUM 8.4 (L) 10/05/2016 0443       DG Chest 2 View  Result Date: 12/27/2019 CLINICAL DATA:  Renal cell carcinoma EXAM: CHEST - 2 VIEW COMPARISON:  None. FINDINGS: Lungs are well expanded, symmetric, and clear. No pneumothorax or pleural effusion. Cardiac size within normal limits. Pulmonary vascularity is normal. Osseous structures are age-appropriate. No acute bone abnormality.  IMPRESSION: No evidence of pulmonary metastatic disease. Electronically Signed   By: Fidela Salisbury MD   On: 12/27/2019 23:28   MR Lumbar Spine W Wo Contrast  Result Date: 01/08/2020 CLINICAL DATA:  History of renal cancer with partial nephrectomy. Recent CT abdomen pelvis demonstrated lytic lesion of Devin L5 vertebral body. EXAM: MRI LUMBAR SPINE WITHOUT AND WITH CONTRAST TECHNIQUE: Multiplanar and multiecho pulse sequences of Devin lumbar spine were obtained without and with intravenous contrast. CONTRAST:  61mL GADAVIST GADOBUTROL 1 MMOL/ML IV SOLN COMPARISON:  CT abdomen and pelvis from 12/27/2019 FINDINGS: Segmentation: 5 non rib-bearing lumbar vertebral bodies. Devin inferior-most fully formed intervertebral disc is labeled L5-S1. Alignment:  Physiologic. Vertebrae: Nearly diffuse smudgy T1 hypointensity throughout Devin L5 vertebral body, which enhances on postcontrast imaging. There is irregular deformity of Devin L5 superior endplate, concerning for pathologic fracture superimposed on degenerative Schmorl's node. No definite epidural extension of tumor. Mild enhancement dorsal to Devin L5 vertebral body. There are multiple small rounded areas of T1 hypointensity enhancement involving Devin sacrum, including a 1.1 cm lesion at S1 and 0.6 cm and 0.4 cm lesions at S2 (see series 5, image 8) which are concerning for additional metastases. There is a fatty lesion in Devin left posterior ilium, likely a benign intraosseous lipoma or hemangioma. There scattered T1 hyperintense vertebral lesions, compatible with venous malformations (hemangiomas). Conus medullaris and cauda equina: Conus extends to Devin L1 level. Conus appears normal Paraspinal and other soft tissues: Suboptimally evaluated changes of right superior pole partial nephrectomy. Please see recent CT abdomen pelvis for further evaluation. Small peripherally enhancing T1 hypointense area posterior to bilateral L4-L5 facet joint is favored to reflect inflamed  synovial cysts. Disc levels: T12-L1: No significant disc protrusion, foraminal stenosis, or canal stenosis. L1-L2: No significant disc protrusion, foraminal stenosis, or canal stenosis. L2-L3: Broad-based disc bulge and bilateral facet hypertrophy with mild canal stenosis. No significant foraminal stenosis. L3-L4: Broad-based disc bulge and bilateral facet hypertrophy with moderate canal stenosis. No significant foraminal stenosis. L4-L5: Broad-based disc bulge with moderate bilateral facet hypertrophy and marked ligamentum flavum thickening. Resulting severe canal stenosis.  Mild bilateral foraminal stenosis. L5-S1: Small broad-based disc bulge and bilateral facet hypertrophy without significant canal or foraminal stenosis. IMPRESSION: 1. 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 Likely additional smaller metastases involving S1 and S2, as detailed above. No definite epidural extension of tumor. Mild enhancement dorsal to Devin L5 vertebral body is favored to reflect prominent venous plexus, but warrants attention on follow-up. 2. Irregular deformity of Devin L5 superior endplate is concerning for pathologic fracture superimposed on degenerative change. 3. Degenerative changes result in severe canal stenosis at L4-L5 and moderate canal stenosis at L3-L4. 4. Small peripherally enhancing areas posterior to bilateral L4-L5 facet joints are favored to reflect inflamed synovial cyst. These results will be called to Devin ordering clinician or representative by Devin Radiologist Assistant, and communication documented in Devin PACS or [employer]. Electronically Signed   By: Margaretha Sheffield MD   On: 01/08/2020 07:59    Assessment and Plan:    79 year old man with:  1.  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  CT scan of Devin abdomen and pelvis  on October 8 showed no other abnormalities or visceral metastatic involvement.  Chest x-ray was also normal.  These findings discussed today with Devin [name] and Devin [name] wife.  And Devin differential diagnosis was reviewed.  These findings are highly suspicious for metastatic malignancy and benign findings such as osteoporosis and compression fractures related to Devin are considered less likely but always a possibility.  Primary malignancy if this indeed is metastatic disease, could be a number of possibilities.  Is chromophobe renal cell carcinoma is considered less likely.  Other malignancy such as melanoma, lung cancer among others but also will reviewed.  To work this up completely, will obtain a pet imaging of Devin whole body and obtain a tissue biopsy to make a diagnosis.  Targeting L5 would be reasonable unless Devin [name] PET scan shows other targets that could be more accessible.  Possible kyphoplasty of L5 could be also considered but Devin [name] is currently asymptomatic from Devin compression fracture.  Treatment options for this condition will be determined by Devin etiology.  Local therapy with kyphoplasty followed by radiation therapy Devin possibility.  Systemic therapy will be used if we are dealing with widespread metastatic disease.  Devin [name] will complete Devin work-up and return in Devin next few weeks to discuss Devin results.  2.  Renal cell carcinoma: Devin [name] is status post right partial nephrectomy with chromophobe histology.  Likelihood of metastatic disease remains low at this time.  3.  Follow-up: Will be Devin next few weeks to discuss results of Devin [name] work-up.  60  minutes were dedicated to this visit. Devin time was spent on reviewing imaging studies, discussing treatment options, discussing differential diagnosis and answering questions regarding future plan.    A copy of this consult has been forwarded to Devin requesting physician.

## 2020-02-03 ENCOUNTER — Ambulatory Visit (HOSPITAL_COMMUNITY)
Admission: RE | Admit: 2020-02-03 | Discharge: 2020-02-03 | Disposition: A | Discharge status: Home / Self Care | Payer: Medicare HMO | Source: Ambulatory Visit | Attending: Oncology | Admitting: Oncology

## 2020-02-03 ENCOUNTER — Other Ambulatory Visit: Payer: Self-pay

## 2020-02-03 DIAGNOSIS — I2510 Atherosclerotic heart disease of native coronary artery without angina pectoris: Secondary | ICD-10-CM | POA: Diagnosis not present

## 2020-02-03 DIAGNOSIS — C419 Malignant neoplasm of bone and articular cartilage, unspecified: Secondary | ICD-10-CM | POA: Diagnosis not present

## 2020-02-03 DIAGNOSIS — C772 Secondary and unspecified malignant neoplasm of intra-abdominal lymph nodes: Secondary | ICD-10-CM | POA: Diagnosis not present

## 2020-02-03 DIAGNOSIS — N281 Cyst of kidney, acquired: Secondary | ICD-10-CM | POA: Diagnosis not present

## 2020-02-03 DIAGNOSIS — Z905 Acquired absence of kidney: Secondary | ICD-10-CM | POA: Insufficient documentation

## 2020-02-03 DIAGNOSIS — C641 Malignant neoplasm of right kidney, except renal pelvis: Secondary | ICD-10-CM | POA: Diagnosis not present

## 2020-02-03 DIAGNOSIS — I700 Atherosclerosis of aorta: Secondary | ICD-10-CM | POA: Diagnosis not present

## 2020-02-03 DIAGNOSIS — D49511 Neoplasm of unspecified behavior of right kidney: Secondary | ICD-10-CM | POA: Diagnosis not present

## 2020-02-03 DIAGNOSIS — C7951 Secondary malignant neoplasm of bone: Secondary | ICD-10-CM | POA: Diagnosis not present

## 2020-02-03 LAB — GLUCOSE, CAPILLARY: Glucose-Capillary: 143 mg/dL — ABNORMAL HIGH (ref 70–99)

## 2020-02-03 MED ORDER — FLUDEOXYGLUCOSE F - 18 (FDG) INJECTION
9.1800 | Freq: Once | INTRAVENOUS | Status: AC
  Administered 2020-02-03: 9.18 via INTRAVENOUS

## 2020-02-04 ENCOUNTER — Other Ambulatory Visit: Payer: Self-pay | Admitting: Radiology

## 2020-02-04 ENCOUNTER — Telehealth: Payer: Self-pay

## 2020-02-04 NOTE — Telephone Encounter (Signed)
Called patient's wife and let her know that per Dr. Alen Blew her husband needs to have the biopsy done and a further discussion will occur at the follow up visit on Friday 11/19. Patient's wife verbalized understanding and is aware of appointment date and time.

## 2020-02-04 NOTE — Progress Notes (Signed)
Spoke with pt wife, Devin [name]. I advised her that I saw a note from MD office that they would like for Devin [name] to continue with biopsy tomorrow, she agreed. I asked if she had any further questions regarding the procedure or directions prior to procedure tomorrow. She states that she was concerned about pt taking his methotrexate and that she spoke with his MD and he said that it was ok to take this medication. Wife was also concerned about soreness and activity after the procedure. I advised that she will be provided with discharge instructions tomorrow, verbally, and given information to take home to reference to. Also, she is welcome to call us with questions or concerns. She was very Patent attorney.

## 2020-02-04 NOTE — Telephone Encounter (Signed)
-----   Message from Wyatt Portela, MD sent at 02/04/2020 12:15 PM EST ----- He needs to have the biopsy done. Will discuss further on 11/19 with his follow up visit. Thanks ----- Message ----- From: Tami Lin, RN Sent: 02/04/2020  12:05 PM EST To: Wyatt Portela, MD  Patient's wife called and stated patient had a PET scan yesterday. She said they are waiting to hear about results. Patient has a biopsy scheduled for tomorrow and wife wants to know if the biopsy is needed. She said they were under the impression that they would hear about PET scan results to see if the biopsy is needed. She said she wants to know in case they need to cancel the biopsy.  Lanelle Bal

## 2020-02-05 ENCOUNTER — Other Ambulatory Visit: Payer: Self-pay

## 2020-02-05 ENCOUNTER — Ambulatory Visit (HOSPITAL_COMMUNITY)
Admission: RE | Admit: 2020-02-05 | Discharge: 2020-02-05 | Disposition: A | Discharge status: Home / Self Care | Payer: Medicare HMO | Source: Ambulatory Visit | Attending: Oncology | Admitting: Oncology

## 2020-02-05 DIAGNOSIS — I10 Essential (primary) hypertension: Secondary | ICD-10-CM | POA: Diagnosis not present

## 2020-02-05 DIAGNOSIS — Z85528 Personal history of other malignant neoplasm of kidney: Secondary | ICD-10-CM | POA: Diagnosis not present

## 2020-02-05 DIAGNOSIS — D49511 Neoplasm of unspecified behavior of right kidney: Secondary | ICD-10-CM

## 2020-02-05 DIAGNOSIS — C9000 Multiple myeloma not having achieved remission: Secondary | ICD-10-CM | POA: Diagnosis not present

## 2020-02-05 DIAGNOSIS — Z79899 Other long term (current) drug therapy: Secondary | ICD-10-CM | POA: Insufficient documentation

## 2020-02-05 DIAGNOSIS — M898X8 Other specified disorders of bone, other site: Secondary | ICD-10-CM | POA: Diagnosis not present

## 2020-02-05 DIAGNOSIS — M899 Disorder of bone, unspecified: Secondary | ICD-10-CM | POA: Diagnosis not present

## 2020-02-05 DIAGNOSIS — Z885 Allergy status to narcotic agent status: Secondary | ICD-10-CM | POA: Diagnosis not present

## 2020-02-05 DIAGNOSIS — Z87891 Personal history of nicotine dependence: Secondary | ICD-10-CM | POA: Insufficient documentation

## 2020-02-05 DIAGNOSIS — C419 Malignant neoplasm of bone and articular cartilage, unspecified: Secondary | ICD-10-CM

## 2020-02-05 DIAGNOSIS — Z888 Allergy status to other drugs, medicaments and biological substances status: Secondary | ICD-10-CM | POA: Diagnosis not present

## 2020-02-05 DIAGNOSIS — D492 Neoplasm of unspecified behavior of bone, soft tissue, and skin: Secondary | ICD-10-CM | POA: Diagnosis not present

## 2020-02-05 HISTORY — PX: IR FLUORO GUIDED NEEDLE PLC ASPIRATION/INJECTION LOC: IMG2395

## 2020-02-05 LAB — CBC
HCT: 33.5 % — ABNORMAL LOW (ref 39.0–52.0)
Hemoglobin: 11.3 g/dL — ABNORMAL LOW (ref 13.0–17.0)
MCH: 35.3 pg — ABNORMAL HIGH (ref 26.0–34.0)
MCHC: 33.7 g/dL (ref 30.0–36.0)
MCV: 104.7 fL — ABNORMAL HIGH (ref 80.0–100.0)
Platelets: 156 10*3/uL (ref 150–400)
RBC: 3.2 MIL/uL — ABNORMAL LOW (ref 4.22–5.81)
RDW: 14.4 % (ref 11.5–15.5)
WBC: 4.7 10*3/uL (ref 4.0–10.5)
nRBC: 0 % (ref 0.0–0.2)

## 2020-02-05 LAB — APTT: aPTT: 36 seconds (ref 24–36)

## 2020-02-05 LAB — PROTIME-INR
INR: 1.2 (ref 0.8–1.2)
Prothrombin Time: 14.6 seconds (ref 11.4–15.2)

## 2020-02-05 MED ORDER — SODIUM CHLORIDE 0.9 % IV SOLN: INTRAVENOUS | Status: DC

## 2020-02-05 MED ORDER — FENTANYL CITRATE (PF) 100 MCG/2ML IJ SOLN
INTRAMUSCULAR | Status: AC | PRN
Start: 2020-02-05 — End: 2020-02-05
  Administered 2020-02-05: 50 ug via INTRAVENOUS

## 2020-02-05 MED ORDER — FENTANYL CITRATE (PF) 100 MCG/2ML IJ SOLN
INTRAMUSCULAR | Status: AC
  Filled 2020-02-05: qty 2

## 2020-02-05 MED ORDER — MIDAZOLAM HCL 2 MG/2ML IJ SOLN
INTRAMUSCULAR | Status: AC | PRN
  Administered 2020-02-05: 0.5 mg via INTRAVENOUS
  Administered 2020-02-05: 1 mg via INTRAVENOUS

## 2020-02-05 MED ORDER — LIDOCAINE HCL 1 % IJ SOLN
INTRAMUSCULAR | Status: AC | PRN
  Administered 2020-02-05: 20 mL via INTRADERMAL

## 2020-02-05 MED ORDER — LIDOCAINE HCL 1 % IJ SOLN
INTRAMUSCULAR | Status: AC
  Filled 2020-02-05: qty 20

## 2020-02-05 MED ORDER — MIDAZOLAM HCL 2 MG/2ML IJ SOLN
INTRAMUSCULAR | Status: AC
  Filled 2020-02-05: qty 2

## 2020-02-05 NOTE — Discharge Instructions (Signed)
Needle Biopsy, Care After This sheet gives you information about how to care for yourself after your procedure. Your health care provider may also give you more specific instructions. If you have problems or questions, contact your health care provider. What can I expect after the procedure? After the procedure, it is common to have soreness, bruising, or mild pain at the puncture site. This should go away in a few days. Follow these instructions at home: Needle insertion site care   Wash your hands with soap and water before you change your bandage (dressing). If you cannot use soap and water, use hand sanitizer.  Follow instructions from your health care provider about how to take care of your puncture site. This includes: ? When and how to change your dressing. ? When to remove your dressing.  Check your puncture site every day for signs of infection. Check for: ? Redness, swelling, or pain. ? Fluid or blood. ? Pus or a bad smell. ? Warmth. General instructions  Return to your normal activities as told by your health care provider. Ask your health care provider what activities are safe for you.  Do not take baths, swim, or use a hot tub until your health care provider approves. Ask your health care provider if you may take showers. You may only be allowed to take sponge baths.  Take over-the-counter and prescription medicines only as told by your health care provider.  Keep all follow-up visits as told by your health care provider. This is important. Contact a health care provider if:  You have a fever.  You have redness, swelling, or pain at the puncture site that lasts longer than a few days.  You have fluid, blood, or pus coming from your puncture site.  Your puncture site feels warm to the touch. Get help right away if:  You have severe bleeding from the puncture site. Summary  After the procedure, it is common to have soreness, bruising, or mild pain at the puncture  site. This should go away in a few days.  Check your puncture site every day for signs of infection, such as redness, swelling, or pain.  Get help right away if you have severe bleeding from your puncture site. This information is not intended to replace advice given to you by your health care provider. Make sure you discuss any questions you have with your health care provider. Document Revised: 05/19/2017 Document Reviewed: 03/20/2017 [employer]vier Patient Education  2020 [employer]  

## 2020-02-05 NOTE — H&P (Signed)
Chief Complaint: Patient was seen in consultation today for L5 lytic lesion/biopsy.  Referring Physician(s): Wyatt Portela  Supervising Physician: Arne Cleveland  Patient Status: [facility] - Out-pt  History of Present Illness: Devin [name] is a 79 y.o. male with a past medical history of hypertension, PE, RCC, and arthritis. He was unfortunately diagnosed with RCC in 2018. He underwent a robotic assisted laparoscopic partial right nephrectomy 10/03/2016 by Dr. Alinda Money. Follow-up imaging revealed no evidence of malignancy of right kidney, however did show a L5 lytic lesion. He was referred to oncology for further management and NM PET was obtained for further evaluation.  MR lumbar spine 01/08/2020: 1. Findings compatible with osseous metastatic disease involving the L5 vertebral body. Likely additional smaller metastases involving S1 and S2, as detailed above. No definite epidural extension of tumor. Mild enhancement dorsal to the L5 vertebral body is favored to reflect prominent venous plexus, but warrants attention on follow-up. 2. Irregular deformity of the L5 superior endplate is concerning for pathologic fracture superimposed on degenerative change. 3. Degenerative changes result in severe canal stenosis at L4-L5 and moderate canal stenosis at L3-L4. 4. Small peripherally enhancing areas posterior to bilateral L4-L5 facet joints are favored to reflect inflamed synovial cyst.  NM PET 02/03/2020: 1. Status post right upper pole partial nephrectomy. 2. 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 Additional suspected metastasis involving the right lateral 2nd rib. 3. Small left axillary nodes are likely reactive in the setting of recent COVID vaccination.  IR consulted by Dr. Alen Blew for possible image-guided L5 lytic lesion biopsy for tissue diagnosis. Patient awake and alert sitting in bed with no complaints at this time. Denies fever, chills, chest pain, dyspnea, abdominal pain, headache, or back  pain.   Past Medical History:  Diagnosis Date  . Arthritis   . History of pulmonary embolism 04/2016  . Hypertension     Past Surgical History:  Procedure Laterality Date  . APPENDECTOMY    . DENTAL SURGERY  03/2016   all upper teeth pulled and dentures.   Marland Kitchen HERNIA REPAIR  1985&2001  . right knee replacement   04/2016  . right wrist plate due to fracture     . ROBOT ASSISTED LAPAROSCOPIC NEPHRECTOMY Right 10/03/2016   Procedure: XI ROBOTIC ASSISTED LAPAROSCOPIC  PARTIAL NEPHRECTOMY;  Surgeon: Raynelle Bring, MD;  Location: [facility] ORS;  Service: Urology;  Laterality: Right;    Allergies: Pravastatin, Zestril [lisinopril], and Oxycodone  Medications: Prior to Admission medications   Medication Sig Start Date End Date Taking? Authorizing Provider  acetaminophen (TYLENOL) 500 MG tablet Take 1,000 mg by mouth daily as needed for mild pain or moderate pain.   Yes [provider]  allopurinol (ZYLOPRIM) 300 MG tablet Take 300 mg by mouth daily.  07/29/11  Yes [provider]  amLODipine (NORVASC) 5 MG tablet Take 5 mg by mouth daily.    Yes [provider]  folic acid (FOLVITE) 1 MG tablet Take 1 mg by mouth daily.   Yes [provider]  furosemide (LASIX) 20 MG tablet Take 20 mg by mouth 2 (two) times daily.  07/25/11  Yes [provider]  labetalol (NORMODYNE) 300 MG tablet Take 300 mg by mouth 2 (two) times daily.  07/29/11  Yes [provider]  losartan (COZAAR) 100 MG tablet Take 100 mg by mouth daily.   Yes [provider]  methotrexate (RHEUMATREX) 2.5 MG tablet Take 25 mg by mouth every Wednesday.    Yes [provider]  Vitamin D, Ergocalciferol, (DRISDOL) 1.25 MG (50000 UNIT) CAPS capsule Take 50,000 Units by mouth every 14 (fourteen) days. 12/24/19  Yes [provider]     Family History  Problem Relation Age of Onset  . Cancer Mother        bone    Social History   Socioeconomic History  .  Marital status: Married    Spouse name: Not on file  . Number of children: Not on file  . Years of education: Not on file  . Highest education level: Not on file  Occupational History  . Not on file  Tobacco Use  . Smoking status: Former Research scientist (life sciences)  . Smokeless tobacco: Former Systems developer    Quit date: 08/09/1985  Vaping Use  . Vaping Use: Never used  Substance and Sexual Activity  . Alcohol use: Yes    Comment: occ beer   . Drug use: No  . Sexual activity: Not on file  Other Topics Concern  . Not on file  Social History Narrative  . Not on file   Social Determinants of Health   Financial Resource Strain:   . Difficulty of Paying Living Expenses: Not on file  Food Insecurity:   . Worried About Charity fundraiser in the Last Year: Not on file  . Ran Out of Food in the Last Year: Not on file  Transportation Needs:   . Lack of Transportation (Medical): Not on file  . Lack of Transportation (Non-Medical): Not on file  Physical Activity:   . Days of Exercise per Week: Not on file  . Minutes of Exercise per Session: Not on file  Stress:   . Feeling of Stress : Not on file  Social Connections:   . Frequency of Communication with Friends and Family: Not on file  . Frequency of Social Gatherings with Friends and Family: Not on file  . Attends Religious Services: Not on file  . Active Member of Clubs or Organizations: Not on file  . Attends Archivist Meetings: Not on file  . Marital Status: Not on file     Review of Systems: A 12 point ROS discussed and pertinent positives are indicated in the HPI above.  All other systems are negative.  Review of Systems  Constitutional: Negative for chills and fever.  Respiratory: Negative for shortness of breath and wheezing.   Cardiovascular: Negative for chest pain and palpitations.  Gastrointestinal: Negative for abdominal pain.  Musculoskeletal: Negative for back pain.  Neurological: Negative for headaches.   Psychiatric/Behavioral: Negative for behavioral problems and confusion.    Vital Signs: BP (!) 168/89   Pulse 65   Temp 98.4 F (36.9 C) (Oral)   Ht 5\' 10"  (1.778 m)   Wt 190 lb (86.2 kg)   SpO2 100%   BMI 27.26 kg/m   Physical Exam Vitals and nursing note reviewed.  Constitutional:      General: He is not in acute distress.    Appearance: Normal appearance.  Cardiovascular:     Rate and Rhythm: Normal rate and regular rhythm.     Heart sounds: Normal heart sounds. No murmur heard.   Pulmonary:     Effort: Pulmonary effort is normal. No respiratory distress.     Breath sounds: Normal breath sounds. No wheezing.  Skin:    General: Skin is warm and dry.  Neurological:     Mental Status: He is alert and oriented to person, place, and time.      MD Evaluation  Airway: WNL Heart: WNL Abdomen: WNL Chest/ Lungs: WNL ASA  Classification: 3 Mallampati/Airway Score: Two   Imaging: MR Lumbar Spine W Wo Contrast  Result Date: 01/08/2020 CLINICAL DATA:  History of renal cancer with partial nephrectomy. Recent CT abdomen pelvis demonstrated lytic lesion of the L5 vertebral body. EXAM: MRI LUMBAR SPINE WITHOUT AND WITH CONTRAST TECHNIQUE: Multiplanar and multiecho pulse sequences of the lumbar spine were obtained without and with intravenous contrast. CONTRAST:  61mL GADAVIST GADOBUTROL 1 MMOL/ML IV SOLN COMPARISON:  CT abdomen and pelvis from 12/27/2019 FINDINGS: Segmentation: 5 non rib-bearing lumbar vertebral bodies. The inferior-most fully formed intervertebral disc is labeled L5-S1. Alignment:  Physiologic. Vertebrae: Nearly diffuse smudgy T1 hypointensity throughout the L5 vertebral body, which enhances on postcontrast imaging. There is irregular deformity of the L5 superior endplate, concerning for pathologic fracture superimposed on degenerative Schmorl's node. No definite epidural extension of tumor. Mild enhancement dorsal to the L5 vertebral body. There are multiple small  rounded areas of T1 hypointensity enhancement involving the sacrum, including a 1.1 cm lesion at S1 and 0.6 cm and 0.4 cm lesions at S2 (see series 5, image 8) which are concerning for additional metastases. There is a fatty lesion in the left posterior ilium, likely a benign intraosseous lipoma or hemangioma. There scattered T1 hyperintense vertebral lesions, compatible with venous malformations (hemangiomas). Conus medullaris and cauda equina: Conus extends to the L1 level. Conus appears normal Paraspinal and other soft tissues: Suboptimally evaluated changes of right superior pole partial nephrectomy. Please see recent CT abdomen pelvis for further evaluation. Small peripherally enhancing T1 hypointense area posterior to bilateral L4-L5 facet joint is favored to reflect inflamed synovial cysts. Disc levels: T12-L1: No significant disc protrusion, foraminal stenosis, or canal stenosis. L1-L2: No significant disc protrusion, foraminal stenosis, or canal stenosis. L2-L3: Broad-based disc bulge and bilateral facet hypertrophy with mild canal stenosis. No significant foraminal stenosis. L3-L4: Broad-based disc bulge and bilateral facet hypertrophy with moderate canal stenosis. No significant foraminal stenosis. L4-L5: Broad-based disc bulge with moderate bilateral facet hypertrophy and marked ligamentum flavum thickening. Resulting severe canal stenosis. Mild bilateral foraminal stenosis. L5-S1: Small broad-based disc bulge and bilateral facet hypertrophy without significant canal or foraminal stenosis. IMPRESSION: 1. Findings compatible with osseous metastatic disease involving the L5 vertebral body. Likely additional smaller metastases involving S1 and S2, as detailed above. No definite epidural extension of tumor. Mild enhancement dorsal to the L5 vertebral body is favored to reflect prominent venous plexus, but warrants attention on follow-up. 2. Irregular deformity of the L5 superior endplate is concerning for  pathologic fracture superimposed on degenerative change. 3. Degenerative changes result in severe canal stenosis at L4-L5 and moderate canal stenosis at L3-L4. 4. Small peripherally enhancing areas posterior to bilateral L4-L5 facet joints are favored to reflect inflamed synovial cyst. These results will be called to the ordering clinician or representative by the Radiologist Assistant, and communication documented in the PACS or [employer]. Electronically Signed   By: Margaretha Sheffield MD   On: 01/08/2020 07:59   NM PET Image Restag (PS) Skull Base To Thigh  Result Date: 02/03/2020 CLINICAL DATA:  Initial treatment strategy for right renal cancer, status post partial nephrectomy, with suspected metastases at L5 and S1. EXAM: NUCLEAR MEDICINE PET SKULL BASE TO THIGH TECHNIQUE: 9.2 mCi F-18 FDG was injected intravenously. Full-ring PET imaging was performed from the skull base to thigh after the radiotracer. CT data was obtained and used for attenuation correction and anatomic localization. Fasting blood glucose: 143 mg/dl  COMPARISON:  MRI lumbar spine dated 01/08/2020. CTA chest dated 06/20/2019. FINDINGS: Mediastinal blood pool activity: SUV max 2.6 Liver activity: SUV max NA NECK: No hypermetabolic cervical lymphadenopathy. Incidental CT findings: none CHEST: No suspicious pulmonary nodules. No hypermetabolic mediastinal lymphadenopathy. Small left axillary nodes, max SUV 4.6, likely reactive in the setting of recent COVID vaccine. 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 Atherosclerotic calcifications of the aortic root/arch. Three vessel coronary atherosclerosis. ABDOMEN/PELVIS: No abnormal hypermetabolism in the liver, spleen, pancreas, or adrenal glands. No hypermetabolic abdominopelvic lymphadenopathy. Postsurgical changes related to right upper pole partial nephrectomy (series 4/image 118). 5.9 cm right lower pole renal cyst. 8 mm hyperdense left lower  pole renal lesion (series 4/image 138), likely reflecting a hemorrhagic cyst. 3 mm nonobstructing left upper pole renal calculus. No hydronephrosis. Incidental CT findings: Layering tiny gallstones, without associated inflammatory changes. Atherosclerotic calcifications the abdominal aorta and branch vessels. Prior right inguinal hernia mesh repair. Tiny fat containing left inguinal hernia. Prostatomegaly. SKELETON: Lytic lesion in the L5 vertebral body (series 4/image 160), max SUV 5.3. Additional focal hypermetabolism at S1-2, max SUV 4.1, corresponding to additional suspected metastasis on recent MR. Mild focal hypermetabolism involving the right lateral second rib, max SUV 3.7. Incidental CT findings: Degenerative changes of the visualized thoracolumbar spine. 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 IMPRESSION: Status post right upper pole partial nephrectomy. 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 Additional suspected metastasis involving the right lateral 2nd rib. Small left axillary nodes are likely reactive in the setting of recent COVID vaccination. Electronically Signed   By: Julian Hy M.D.   On: 02/03/2020 14:03    Labs:  CBC: No results for input(s): WBC, HGB, HCT, PLT in the last 8760 hours.  COAGS: No results for input(s): INR, APTT in the last 8760 hours.   Assessment and Plan:  L5 lytic lesion (in setting of prior RCC), hypermetabolic on NM PET. Plan for image-guided L5 lytic lesion biopsy today in IR. Patient is NPO. Afebrile. INR pending.  Risks and benefits discussed with the patient including, but not limited to bleeding, infection, damage to adjacent structures or low yield requiring additional tests. All of the patient's questions were answered, patient is agreeable to proceed. Consent signed and in chart.   Thank you for this interesting consult.  I greatly enjoyed meeting ALONDRA SAHNI and look forward to participating in their care.  A copy of  this report was sent to the requesting provider on this date.  Electronically Signed: Earley Abide, PA-C 02/05/2020, 10:11 AM   I spent a total of 30 Minutes in face to face in clinical consultation, greater than 50% of which was counseling/coordinating care for L5 lytic lesion/biopsy.

## 2020-02-05 NOTE — Procedures (Signed)
  Procedure: FLuoro guided core biopsy L5 lytic lesion   EBL:   minimal Complications:  none immediate  See full dictation in [employer].  Dillard Cannon MD Main # [phone] Pager  [phone] Mobile [phone]

## 2020-02-06 ENCOUNTER — Other Ambulatory Visit: Payer: Self-pay | Admitting: Oncology

## 2020-02-07 ENCOUNTER — Inpatient Hospital Stay (HOSPITAL_BASED_OUTPATIENT_CLINIC_OR_DEPARTMENT_OTHER): Payer: Medicare HMO | Admitting: Oncology

## 2020-02-07 ENCOUNTER — Other Ambulatory Visit: Payer: Self-pay

## 2020-02-07 VITALS — BP 166/97 | HR 66 | Temp 97.8°F | Resp 17 | Ht 70.0 in | Wt 187.2 lb

## 2020-02-07 DIAGNOSIS — D49511 Neoplasm of unspecified behavior of right kidney: Secondary | ICD-10-CM

## 2020-02-07 DIAGNOSIS — C801 Malignant (primary) neoplasm, unspecified: Secondary | ICD-10-CM | POA: Diagnosis not present

## 2020-02-07 DIAGNOSIS — C641 Malignant neoplasm of right kidney, except renal pelvis: Secondary | ICD-10-CM | POA: Diagnosis not present

## 2020-02-07 DIAGNOSIS — M899 Disorder of bone, unspecified: Secondary | ICD-10-CM | POA: Diagnosis not present

## 2020-02-07 DIAGNOSIS — M0609 Rheumatoid arthritis without rheumatoid factor, multiple sites: Secondary | ICD-10-CM | POA: Diagnosis not present

## 2020-02-07 NOTE — Progress Notes (Signed)
Hematology and Oncology Follow Up Visit  Devin [name] [id number] 01/26/1941 79 y.o. 02/07/2020 3:32 PM Devin Austin, MD (Inactive)No ref. provider found   Principle Diagnosis: 79 year old man with renal cell carcinoma diagnosed in 2018.  He was found to have 3.5 cm chromophobe histology.  Secondary diagnosis: Lytic metastasis noted at L5 and S1 with suspected metastasis of the right lateral second rib.  Primary is unknown.   Prior Therapy: He is status post right robotic laparoscopic partial nephrectomy on October 03, 2016.  Current therapy: Under evaluation for possible therapy.  Interim History: Devin [name] returns today for a follow-up visit.  Since the last visit, he underwent a PET scan and biopsy of his metastatic lesion.  Clinically, he reports no major changes in his health.  He has tolerated the procedure well without any increased bone pain or discomfort.  His performance status and quality of life remained excellent.      Medications: I have reviewed the patient's current medications.  Current Outpatient Medications  Medication Sig Dispense Refill  . acetaminophen (TYLENOL) 500 MG tablet Take 1,000 mg by mouth daily as needed for mild pain or moderate pain.    Marland Kitchen allopurinol (ZYLOPRIM) 300 MG tablet Take 300 mg by mouth daily.     Marland Kitchen amLODipine (NORVASC) 5 MG tablet Take 5 mg by mouth daily.     . folic acid (FOLVITE) 1 MG tablet Take 1 mg by mouth daily.    . furosemide (LASIX) 20 MG tablet Take 20 mg by mouth 2 (two) times daily.     Marland Kitchen labetalol (NORMODYNE) 300 MG tablet Take 300 mg by mouth 2 (two) times daily.     Marland Kitchen losartan (COZAAR) 100 MG tablet Take 100 mg by mouth daily.    . methotrexate (RHEUMATREX) 2.5 MG tablet Take 25 mg by mouth every Wednesday.     . Vitamin D, Ergocalciferol, (DRISDOL) 1.25 MG (50000 UNIT) CAPS capsule Take 50,000 Units by mouth every 14 (fourteen) days.     No current facility-administered medications for this visit.     Allergies:   Allergies  Allergen Reactions  . Pravastatin Other (See Comments)    Joint pain  . Zestril [Lisinopril] Other (See Comments)    Gout   ( Zestril) gout  . Oxycodone Other (See Comments)    Causes agitation       Physical Exam: Blood pressure (!) 166/97, pulse 66, temperature 97.8 F (36.6 C), temperature source Tympanic, resp. rate 17, height 5\' 10"  (1.778 m), weight 187 lb 3.2 oz (84.9 kg), SpO2 100 %.   ECOG:    General appearance: Comfortable appearing without any discomfort Head: Normocephalic without any trauma Oropharynx: Mucous membranes are moist and pink without any thrush or ulcers. Eyes: Pupils are equal and round reactive to light. Lymph nodes: No cervical, supraclavicular, inguinal or axillary lymphadenopathy.   Heart:regular rate and rhythm.  S1 and S2 without leg edema. Lung: Clear without any rhonchi or wheezes.  No dullness to percussion. Abdomin: Soft, nontender, nondistended with good bowel sounds.  No hepatosplenomegaly. Musculoskeletal: No joint deformity or effusion.  Full range of motion noted. Neurological: No deficits noted on motor, sensory and deep tendon reflex exam. Skin: No petechial rash or dryness.  Appeared moist.      Lab Results: Lab Results  Component Value Date   WBC 4.7 02/05/2020   HGB 11.3 (L) 02/05/2020   HCT 33.5 (L) 02/05/2020   MCV 104.7 (H) 02/05/2020   PLT 156 02/05/2020  Chemistry      Component Value Date/Time   NA 132 (L) 10/05/2016 0443   K 3.7 10/05/2016 0443   CL 98 (L) 10/05/2016 0443   CO2 25 10/05/2016 0443   BUN 16 10/05/2016 0443   CREATININE 1.25 (H) 10/05/2016 0443      Component Value Date/Time   CALCIUM 8.4 (L) 10/05/2016 0443       Radiological Studies:  IMPRESSION: Status post right upper pole partial nephrectomy.  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 Additional suspected metastasis involving the right lateral 2nd rib.  Small left axillary nodes are likely reactive in the setting  of recent COVID vaccination.  Impression and Plan:  79 year old man with:  1.    Metastatic bone lesions noted on MRI of the spine at L5 and S1 noted on MRI on October 20 with a PET CT scan confirmed on November 15.  Tissue biopsy obtained on November 17 and results are pending.  These findings were discussed today in detail with the patient including review of the differential diagnosis.  Metastatic kidney cancer remains low on the differential given the original histology.  Other etiologies include metastatic melanoma, urothelial cancer lung cancer among others continue to be a consideration.   Definitive treatments options were reviewed.  Systemic therapy versus local radiation were discussed.  Systemic therapy could be an option in the future but will need to be final pathology before doing so.   Radiation therapy option would be reasonable which will offer palliation of symptoms as well as disease control given the limited areas of metastasis.   2.    Kidney cancer with chromophobe histology: He status post surgical resection completed in 2018.  3.  Follow-up: In the next few weeks to follow his progress after completing radiation and obtaining the final results of pathology.  30  minutes were spent on this encounter.  The time was dedicated to reviewing his disease status, reviewing imaging studies and future plan of care review.      Zola Button, MD 11/19/20213:32 PM

## 2020-02-10 ENCOUNTER — Telehealth: Payer: Self-pay | Admitting: *Deleted

## 2020-02-10 LAB — SURGICAL PATHOLOGY

## 2020-02-10 NOTE — Telephone Encounter (Signed)
Results are not back. Once available, I will contact them.

## 2020-02-10 NOTE — Telephone Encounter (Signed)
Patients wife called asking if biopsy results have returned if Dr. Alen Blew would be reviewing them with the patient or if another physician would be doing that.  Pathology is active but not in process in the chart.    Routed to MD to advise if the path was sent out or being processed in house and if he would be the one relaying the results.

## 2020-02-11 ENCOUNTER — Other Ambulatory Visit: Payer: Self-pay | Admitting: Oncology

## 2020-02-11 ENCOUNTER — Other Ambulatory Visit: Payer: Self-pay

## 2020-02-11 ENCOUNTER — Inpatient Hospital Stay: Payer: Medicare HMO

## 2020-02-11 DIAGNOSIS — C9000 Multiple myeloma not having achieved remission: Secondary | ICD-10-CM

## 2020-02-11 DIAGNOSIS — D49511 Neoplasm of unspecified behavior of right kidney: Secondary | ICD-10-CM

## 2020-02-11 DIAGNOSIS — M899 Disorder of bone, unspecified: Secondary | ICD-10-CM | POA: Diagnosis not present

## 2020-02-11 DIAGNOSIS — C801 Malignant (primary) neoplasm, unspecified: Secondary | ICD-10-CM | POA: Diagnosis not present

## 2020-02-11 DIAGNOSIS — C641 Malignant neoplasm of right kidney, except renal pelvis: Secondary | ICD-10-CM | POA: Diagnosis not present

## 2020-02-11 LAB — CBC WITH DIFFERENTIAL (CANCER CENTER ONLY)
Abs Immature Granulocytes: 0.01 10*3/uL (ref 0.00–0.07)
Basophils Absolute: 0 10*3/uL (ref 0.0–0.1)
Basophils Relative: 1 %
Eosinophils Absolute: 0.1 10*3/uL (ref 0.0–0.5)
Eosinophils Relative: 3 %
HCT: 31.7 % — ABNORMAL LOW (ref 39.0–52.0)
Hemoglobin: 11.2 g/dL — ABNORMAL LOW (ref 13.0–17.0)
Immature Granulocytes: 0 %
Lymphocytes Relative: 24 %
Lymphs Abs: 1.1 10*3/uL (ref 0.7–4.0)
MCH: 35.7 pg — ABNORMAL HIGH (ref 26.0–34.0)
MCHC: 35.3 g/dL (ref 30.0–36.0)
MCV: 101 fL — ABNORMAL HIGH (ref 80.0–100.0)
Monocytes Absolute: 0.4 10*3/uL (ref 0.1–1.0)
Monocytes Relative: 9 %
Neutro Abs: 2.9 10*3/uL (ref 1.7–7.7)
Neutrophils Relative %: 63 %
Platelet Count: 161 10*3/uL (ref 150–400)
RBC: 3.14 MIL/uL — ABNORMAL LOW (ref 4.22–5.81)
RDW: 14.1 % (ref 11.5–15.5)
WBC Count: 4.6 10*3/uL (ref 4.0–10.5)
nRBC: 0 % (ref 0.0–0.2)

## 2020-02-11 LAB — CMP (CANCER CENTER ONLY)
ALT: 20 U/L (ref 0–44)
AST: 16 U/L (ref 15–41)
Albumin: 3.5 g/dL (ref 3.5–5.0)
Alkaline Phosphatase: 56 U/L (ref 38–126)
Anion gap: 10 (ref 5–15)
BUN: 21 mg/dL (ref 8–23)
CO2: 24 mmol/L (ref 22–32)
Calcium: 9.5 mg/dL (ref 8.9–10.3)
Chloride: 101 mmol/L (ref 98–111)
Creatinine: 1.73 mg/dL — ABNORMAL HIGH (ref 0.61–1.24)
GFR, Estimated: 40 mL/min — ABNORMAL LOW (ref >60)
Glucose, Bld: 172 mg/dL — ABNORMAL HIGH (ref 70–99)
Potassium: 4 mmol/L (ref 3.5–5.1)
Sodium: 135 mmol/L (ref 135–145)
Total Bilirubin: 0.4 mg/dL (ref 0.3–1.2)
Total Protein: 9.1 g/dL — ABNORMAL HIGH (ref 6.5–8.1)

## 2020-02-11 NOTE — Progress Notes (Signed)
The results of the biopsy indicates a plasma cell disorder at this time. Differential diagnosis including multiple myeloma versus isolated plasmacytoma. I recommended this to proceeding with radiation given his limited disease and will we will obtain serological work-up including protein studies with serum protein electrophoresis and quantitative immunoglobulins. Pending these results we would recommend systemic therapy after radiation possibly.  This was discussed with the patient and his wife today and they are agreeable with this plan. We will obtain laboratory testing today.

## 2020-02-12 LAB — MULTIPLE MYELOMA PANEL, SERUM
Albumin SerPl Elph-Mcnc: 3.7 g/dL (ref 2.9–4.4)
Albumin/Glob SerPl: 0.8 (ref 0.7–1.7)
Alpha 1: 0.3 g/dL (ref 0.0–0.4)
Alpha2 Glob SerPl Elph-Mcnc: 0.8 g/dL (ref 0.4–1.0)
B-Globulin SerPl Elph-Mcnc: 0.8 g/dL (ref 0.7–1.3)
Gamma Glob SerPl Elph-Mcnc: 3.1 g/dL — ABNORMAL HIGH (ref 0.4–1.8)
Globulin, Total: 4.9 g/dL — ABNORMAL HIGH (ref 2.2–3.9)
IgA: 2579 mg/dL — ABNORMAL HIGH (ref 61–437)
IgG (Immunoglobin G), Serum: 415 mg/dL — ABNORMAL LOW (ref 603–1613)
IgM (Immunoglobulin M), Srm: 9 mg/dL — ABNORMAL LOW (ref 15–143)
M Protein SerPl Elph-Mcnc: 2.5 g/dL — ABNORMAL HIGH
Total Protein ELP: 8.6 g/dL — ABNORMAL HIGH (ref 6.0–8.5)

## 2020-02-12 LAB — KAPPA/LAMBDA LIGHT CHAINS
Kappa free light chain: 13.8 mg/L (ref 3.3–19.4)
Kappa, lambda light chain ratio: 0.01 — ABNORMAL LOW (ref 0.26–1.65)
Lambda free light chains: 1318.8 mg/L — ABNORMAL HIGH (ref 5.7–26.3)

## 2020-02-14 DIAGNOSIS — I440 Atrioventricular block, first degree: Secondary | ICD-10-CM | POA: Diagnosis not present

## 2020-02-14 DIAGNOSIS — E785 Hyperlipidemia, unspecified: Secondary | ICD-10-CM | POA: Diagnosis not present

## 2020-02-14 DIAGNOSIS — I10 Essential (primary) hypertension: Secondary | ICD-10-CM | POA: Diagnosis not present

## 2020-02-14 DIAGNOSIS — J984 Other disorders of lung: Secondary | ICD-10-CM | POA: Diagnosis not present

## 2020-02-14 DIAGNOSIS — Z87891 Personal history of nicotine dependence: Secondary | ICD-10-CM | POA: Diagnosis not present

## 2020-02-14 DIAGNOSIS — M069 Rheumatoid arthritis, unspecified: Secondary | ICD-10-CM | POA: Diagnosis not present

## 2020-02-14 DIAGNOSIS — Z86711 Personal history of pulmonary embolism: Secondary | ICD-10-CM | POA: Diagnosis not present

## 2020-02-14 DIAGNOSIS — R0902 Hypoxemia: Secondary | ICD-10-CM | POA: Diagnosis not present

## 2020-02-14 DIAGNOSIS — R531 Weakness: Secondary | ICD-10-CM | POA: Diagnosis not present

## 2020-02-14 DIAGNOSIS — I4430 Unspecified atrioventricular block: Secondary | ICD-10-CM | POA: Diagnosis not present

## 2020-02-14 DIAGNOSIS — I959 Hypotension, unspecified: Secondary | ICD-10-CM | POA: Diagnosis not present

## 2020-02-14 DIAGNOSIS — R55 Syncope and collapse: Secondary | ICD-10-CM | POA: Diagnosis not present

## 2020-02-14 DIAGNOSIS — Z20822 Contact with and (suspected) exposure to covid-19: Secondary | ICD-10-CM | POA: Diagnosis not present

## 2020-02-14 DIAGNOSIS — Z885 Allergy status to narcotic agent status: Secondary | ICD-10-CM | POA: Diagnosis not present

## 2020-02-14 DIAGNOSIS — R42 Dizziness and giddiness: Secondary | ICD-10-CM | POA: Diagnosis not present

## 2020-02-14 DIAGNOSIS — R4020 Unspecified coma: Secondary | ICD-10-CM | POA: Diagnosis not present

## 2020-02-14 DIAGNOSIS — Z85528 Personal history of other malignant neoplasm of kidney: Secondary | ICD-10-CM | POA: Diagnosis not present

## 2020-02-17 ENCOUNTER — Other Ambulatory Visit: Payer: Self-pay | Admitting: Oncology

## 2020-02-17 DIAGNOSIS — C9000 Multiple myeloma not having achieved remission: Secondary | ICD-10-CM

## 2020-02-17 NOTE — Progress Notes (Signed)
Laboratory data from 02/11/2020 were personally reviewed and discussed with the patient and his wife via phone today.  His protein studies showed M spike of 2.5 g/dL with IgA level of 2579.  His creatinine slightly worse at 1.73 with normal electrolytes, calcium and elevated protein at 9.5.  He is lambda free light chain was elevated at 1318 with kappa to lambda ratio that is depressed.  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  To complete his work-up he will require a bone marrow biopsy and obtaining cytogenetic panel for prognostic purposes.  I would still recommend treating with active radiation therapy and use systemic therapy subsequent to that.  Given his kidney function, Velcade-based regimen likely preferred to Revlimid. 

## 2020-02-18 DIAGNOSIS — M2550 Pain in unspecified joint: Secondary | ICD-10-CM | POA: Diagnosis not present

## 2020-02-18 DIAGNOSIS — C9000 Multiple myeloma not having achieved remission: Secondary | ICD-10-CM | POA: Diagnosis not present

## 2020-02-18 DIAGNOSIS — R55 Syncope and collapse: Secondary | ICD-10-CM | POA: Diagnosis not present

## 2020-02-19 ENCOUNTER — Telehealth: Payer: Self-pay

## 2020-02-19 NOTE — Telephone Encounter (Signed)
Patient's wife called inquiring about getting connected with a nurse navigator for Multiple Myeloma. Informed patient's wife that this cancer center does not currently have a nurse navigator dedicated to multiple myeloma. Offered to contact social work to offer options for support services in the community but patient's wife declined. Informed patient's wife that Dr. Hazeline Junker nurse can help as needed/help get them in contact with the appropriate resources as needed. Patient's wife verbalized understanding and was appreciative for the information.

## 2020-02-24 ENCOUNTER — Telehealth: Payer: Self-pay | Admitting: *Deleted

## 2020-02-24 ENCOUNTER — Other Ambulatory Visit: Payer: Self-pay | Admitting: Oncology

## 2020-02-24 MED ORDER — HYDROCODONE-ACETAMINOPHEN 5-325 MG PO TABS: 1.0000 | ORAL_TABLET | Freq: Four times a day (QID) | ORAL | 0 refills | Status: DC | PRN

## 2020-02-24 NOTE — Telephone Encounter (Signed)
I will send Rx for hydrocodone to his pharmacy.

## 2020-02-24 NOTE — Telephone Encounter (Signed)
Patients wife called to advise that patient has been experiencing severe pain in the lower back/bottom area where his previous biopsy was completed.  Patient reports pain 9 out of 10.   His Rheumatoid doctor ordered Prednisone X 10 days for the pain which helped with all of his joint pain Except for the pain he is experiencing in the lower back at the biopsy site.  They have applied heat without improvement.  Patient is limited to bed because of pain.  Pain is worse with movement of any sort.  Wife is concerned that this could be an adverse side effect of the L5 bone biopsy and is especially concerned about the upcoming Bone Marrow biopsy that is scheduled on 03/02/2020.  She doesn't feel that he is stable enough with the previous pain to proceed with the [place].  Needing advice from provider.  Routed to Dr Alen Blew.

## 2020-02-27 ENCOUNTER — Other Ambulatory Visit: Payer: Self-pay | Admitting: Radiology

## 2020-02-28 ENCOUNTER — Telehealth: Payer: Self-pay | Admitting: Medical Oncology

## 2020-02-28 NOTE — Telephone Encounter (Signed)
Pain management - "hydrocodone helped immensely. Devin [name] is out of bed and moving around "  Concerns re CT BM BX procedure analgesia "How invasive is the procedure ?" She is worried he will be in pain like he was after Bone BX. I reviewed procedure and confirmed with IR that the will be given Fentanyl and Versed. [name] was informed of this and confirmed he will be there Monday am.

## 2020-03-02 ENCOUNTER — Ambulatory Visit (HOSPITAL_COMMUNITY)
Admission: RE | Admit: 2020-03-02 | Discharge: 2020-03-02 | Disposition: A | Discharge status: Home / Self Care | Payer: Medicare HMO | Source: Ambulatory Visit | Attending: Oncology | Admitting: Oncology

## 2020-03-02 ENCOUNTER — Encounter: Payer: Self-pay | Admitting: Radiation Oncology

## 2020-03-02 ENCOUNTER — Other Ambulatory Visit: Payer: Self-pay

## 2020-03-02 ENCOUNTER — Encounter (HOSPITAL_COMMUNITY): Payer: Self-pay

## 2020-03-02 ENCOUNTER — Telehealth: Payer: Self-pay | Admitting: *Deleted

## 2020-03-02 DIAGNOSIS — Z79899 Other long term (current) drug therapy: Secondary | ICD-10-CM | POA: Insufficient documentation

## 2020-03-02 DIAGNOSIS — C9030 Solitary plasmacytoma not having achieved remission: Secondary | ICD-10-CM | POA: Diagnosis not present

## 2020-03-02 DIAGNOSIS — I10 Essential (primary) hypertension: Secondary | ICD-10-CM | POA: Diagnosis not present

## 2020-03-02 DIAGNOSIS — D539 Nutritional anemia, unspecified: Secondary | ICD-10-CM | POA: Diagnosis not present

## 2020-03-02 DIAGNOSIS — Z905 Acquired absence of kidney: Secondary | ICD-10-CM | POA: Insufficient documentation

## 2020-03-02 DIAGNOSIS — Z85528 Personal history of other malignant neoplasm of kidney: Secondary | ICD-10-CM | POA: Insufficient documentation

## 2020-03-02 DIAGNOSIS — C9000 Multiple myeloma not having achieved remission: Secondary | ICD-10-CM

## 2020-03-02 DIAGNOSIS — Z7901 Long term (current) use of anticoagulants: Secondary | ICD-10-CM | POA: Diagnosis not present

## 2020-03-02 DIAGNOSIS — C9001 Multiple myeloma in remission: Secondary | ICD-10-CM | POA: Insufficient documentation

## 2020-03-02 DIAGNOSIS — Z86711 Personal history of pulmonary embolism: Secondary | ICD-10-CM | POA: Insufficient documentation

## 2020-03-02 DIAGNOSIS — M549 Dorsalgia, unspecified: Secondary | ICD-10-CM | POA: Insufficient documentation

## 2020-03-02 DIAGNOSIS — C9010 Plasma cell leukemia not having achieved remission: Secondary | ICD-10-CM | POA: Diagnosis not present

## 2020-03-02 DIAGNOSIS — M069 Rheumatoid arthritis, unspecified: Secondary | ICD-10-CM | POA: Diagnosis not present

## 2020-03-02 DIAGNOSIS — D4989 Neoplasm of unspecified behavior of other specified sites: Secondary | ICD-10-CM | POA: Diagnosis not present

## 2020-03-02 DIAGNOSIS — D6489 Other specified anemias: Secondary | ICD-10-CM | POA: Diagnosis not present

## 2020-03-02 DIAGNOSIS — D47Z9 Other specified neoplasms of uncertain behavior of lymphoid, hematopoietic and related tissue: Secondary | ICD-10-CM | POA: Diagnosis not present

## 2020-03-02 LAB — CBC WITH DIFFERENTIAL/PLATELET
Abs Immature Granulocytes: 0.04 10*3/uL (ref 0.00–0.07)
Basophils Absolute: 0 10*3/uL (ref 0.0–0.1)
Basophils Relative: 0 %
Eosinophils Absolute: 0.1 10*3/uL (ref 0.0–0.5)
Eosinophils Relative: 2 %
HCT: 34.7 % — ABNORMAL LOW (ref 39.0–52.0)
Hemoglobin: 12 g/dL — ABNORMAL LOW (ref 13.0–17.0)
Immature Granulocytes: 1 %
Lymphocytes Relative: 15 %
Lymphs Abs: 1.2 10*3/uL (ref 0.7–4.0)
MCH: 35.8 pg — ABNORMAL HIGH (ref 26.0–34.0)
MCHC: 34.6 g/dL (ref 30.0–36.0)
MCV: 103.6 fL — ABNORMAL HIGH (ref 80.0–100.0)
Monocytes Absolute: 0.6 10*3/uL (ref 0.1–1.0)
Monocytes Relative: 8 %
Neutro Abs: 5.8 10*3/uL (ref 1.7–7.7)
Neutrophils Relative %: 74 %
Platelets: 193 10*3/uL (ref 150–400)
RBC: 3.35 MIL/uL — ABNORMAL LOW (ref 4.22–5.81)
RDW: 14.4 % (ref 11.5–15.5)
WBC: 7.7 10*3/uL (ref 4.0–10.5)
nRBC: 0 % (ref 0.0–0.2)

## 2020-03-02 LAB — PROTIME-INR
INR: 1.1 (ref 0.8–1.2)
Prothrombin Time: 13.8 seconds (ref 11.4–15.2)

## 2020-03-02 MED ORDER — FENTANYL CITRATE (PF) 100 MCG/2ML IJ SOLN
INTRAMUSCULAR | Status: AC | PRN
  Administered 2020-03-02 (×2): 50 ug via INTRAVENOUS

## 2020-03-02 MED ORDER — NALOXONE HCL 0.4 MG/ML IJ SOLN
INTRAMUSCULAR | Status: AC
  Filled 2020-03-02: qty 1

## 2020-03-02 MED ORDER — FENTANYL CITRATE (PF) 100 MCG/2ML IJ SOLN
INTRAMUSCULAR | Status: AC
  Filled 2020-03-02: qty 2

## 2020-03-02 MED ORDER — MIDAZOLAM HCL 2 MG/2ML IJ SOLN
INTRAMUSCULAR | Status: AC | PRN
  Administered 2020-03-02 (×3): 0.5 mg via INTRAVENOUS
  Administered 2020-03-02 (×2): 1 mg via INTRAVENOUS
  Administered 2020-03-02: 0.5 mg via INTRAVENOUS

## 2020-03-02 MED ORDER — LIDOCAINE HCL (PF) 1 % IJ SOLN
INTRAMUSCULAR | Status: AC | PRN
  Administered 2020-03-02: 10 mL via INTRADERMAL

## 2020-03-02 MED ORDER — SODIUM CHLORIDE 0.9 % IV SOLN: INTRAVENOUS | Status: DC

## 2020-03-02 MED ORDER — MIDAZOLAM HCL 2 MG/2ML IJ SOLN
INTRAMUSCULAR | Status: AC
  Filled 2020-03-02: qty 4

## 2020-03-02 MED ORDER — FLUMAZENIL 0.5 MG/5ML IV SOLN
INTRAVENOUS | Status: AC
  Filled 2020-03-02: qty 5

## 2020-03-02 NOTE — Procedures (Signed)
Interventional Radiology Procedure Note  Procedure: CT guided bone marrow aspiration and biopsy  Complications: None  EBL: < 10 mL  Findings: Aspirate and core biopsy performed of bone marrow in right iliac bone.  Plan: Bedrest supine x 1 hrs  Kanijah Groseclose T. Esther Broyles, M.D Pager:  [phone]   

## 2020-03-02 NOTE — Discharge Instructions (Signed)
Please call Interventional Radiology clinic [phone] with any questions or concerns.  You may remove your dressing and shower tomorrow.   Bone Marrow Aspiration and Bone Marrow Biopsy, Adult, Care After This sheet gives you information about how to care for yourself after your procedure. Your health care provider may also give you more specific instructions. If you have problems or questions, contact your health care provider. What can I expect after the procedure? After the procedure, it is common to have:  Mild pain and tenderness.  Swelling.  Bruising. Follow these instructions at home: Puncture site care   Follow instructions from your health care provider about how to take care of the puncture site. Make sure you: ? Wash your hands with soap and water before and after you change your bandage (dressing). If soap and water are not available, use hand sanitizer. ? Change your dressing as told by your health care provider.  Check your puncture site every day for signs of infection. Check for: ? More redness, swelling, or pain. ? Fluid or blood. ? Warmth. ? Pus or a bad smell. Activity  Return to your normal activities as told by your health care provider. Ask your health care provider what activities are safe for you.  Do not lift anything that is heavier than 10 lb (4.5 kg), or the limit that you are told, until your health care provider says that it is safe.  Do not drive for 24 hours if you were given a sedative during your procedure. General instructions   Take over-the-counter and prescription medicines only as told by your health care provider.  Do not take baths, swim, or use a hot tub until your health care provider approves. Ask your health care provider if you may take showers. You may only be allowed to take sponge baths.  If directed, put ice on the affected area. To do this: ? Put ice in a plastic bag. ? Place a towel between your skin and the  bag. ? Leave the ice on for 20 minutes, 2-3 times a day.  Keep all follow-up visits as told by your health care provider. This is important. Contact a health care provider if:  Your pain is not controlled with medicine.  You have a fever.  You have more redness, swelling, or pain around the puncture site.  You have fluid or blood coming from the puncture site.  Your puncture site feels warm to the touch.  You have pus or a bad smell coming from the puncture site. Summary  After the procedure, it is common to have mild pain, tenderness, swelling, and bruising.  Follow instructions from your health care provider about how to take care of the puncture site and what activities are safe for you.  Take over-the-counter and prescription medicines only as told by your health care provider.  Contact a health care provider if you have any signs of infection, such as fluid or blood coming from the puncture site. This information is not intended to replace advice given to you by your health care provider. Make sure you discuss any questions you have with your health care provider. Document Revised: 07/24/2018 Document Reviewed: 07/24/2018 Elsevier Patient Education  2020 [employer]   Moderate Conscious Sedation, Adult, Care After These instructions provide you with information about caring for yourself after your procedure. Your health care provider may also give you more specific instructions. Your treatment has been planned according to current medical practices, but problems sometimes occur. Call   your health care provider if you have any problems or questions after your procedure. What can I expect after the procedure? After your procedure, it is common:  To feel sleepy for several hours.  To feel clumsy and have poor balance for several hours.  To have poor judgment for several hours.  To vomit if you eat too soon. Follow these instructions at home: For at least 24 hours after  the procedure:   Do not: ? Participate in activities where you could fall or become injured. ? Drive. ? Use heavy machinery. ? Drink alcohol. ? Take sleeping pills or medicines that cause drowsiness. ? Make important decisions or sign legal documents. ? Take care of children on your own.  Rest. Eating and drinking  Follow the diet recommended by your health care provider.  If you vomit: ? Drink water, juice, or soup when you can drink without vomiting. ? Make sure you have little or no nausea before eating solid foods. General instructions  Have a responsible adult stay with you until you are awake and alert.  Take over-the-counter and prescription medicines only as told by your health care provider.  If you smoke, do not smoke without supervision.  Keep all follow-up visits as told by your health care provider. This is important. Contact a health care provider if:  You keep feeling nauseous or you keep vomiting.  You feel light-headed.  You develop a rash.  You have a fever. Get help right away if:  You have trouble breathing. This information is not intended to replace advice given to you by your health care provider. Make sure you discuss any questions you have with your health care provider. Document Revised: 02/17/2017 Document Reviewed: 06/27/2015 Elsevier Patient Education  2020 [employer] Inc.  

## 2020-03-02 NOTE — Telephone Encounter (Signed)
Patients wife called about pain medication refill.  Patient had second biospy today and took last dose this morning.  She is requesting refill to [facility] on file.  Routed to Dr. Alen Blew to review.

## 2020-03-02 NOTE — H&P (Signed)
Referring Physician(s): Wyatt Portela  Supervising Physician: Aletta Edouard  Patient Status:  [facility]  Chief Complaint: "I'm here for a bone marrow biopsy"   Subjective: Patient familiar to IR service for L5 bone lesion biopsy on 02/05/2020. He has a past medical history significant for hypertension, prior PE, rheumatoid arthritis, and right renal cell carcinoma 2018, status partial right nephrectomy. L5 lytic lesion biopsy yielded plasma cell neoplasm/disorder.  He presents again today for CT-guided bone marrow biopsy for further evaluation/ rule out myeloma.  He currently denies fever, headache, chest pain, dyspnea, cough, abdominal pain, nausea, vomiting or bleeding.  He does have back pain.  Past Medical History:  Diagnosis Date  . Arthritis   . History of pulmonary embolism 04/2016  . Hypertension    Past Surgical History:  Procedure Laterality Date  . APPENDECTOMY    . DENTAL SURGERY  03/2016   all upper teeth pulled and dentures.   Marland Kitchen HERNIA REPAIR  1985&2001  . IR FLUORO GUIDED NEEDLE PLC ASPIRATION/INJECTION LOC  02/05/2020  . right knee replacement   04/2016  . right wrist plate due to fracture     . ROBOT ASSISTED LAPAROSCOPIC NEPHRECTOMY Right 10/03/2016   Procedure: XI ROBOTIC ASSISTED LAPAROSCOPIC  PARTIAL NEPHRECTOMY;  Surgeon: Raynelle Bring, MD;  Location: [facility];  Service: Urology;  Laterality: Right;       Allergies: Pravastatin, Zestril [lisinopril], and Oxycodone  Medications: Prior to Admission medications   Medication Sig Start Date End Date Taking? Authorizing Provider  acetaminophen (TYLENOL) 500 MG tablet Take 1,000 mg by mouth daily as needed for mild pain or moderate pain.   Yes [provider]  allopurinol (ZYLOPRIM) 300 MG tablet Take 300 mg by mouth daily.  07/29/11  Yes [provider]  amLODipine (NORVASC) 5 MG tablet Take 5 mg by mouth daily.    Yes [provider]  folic acid (FOLVITE) 1 MG tablet Take 1  mg by mouth daily.   Yes [provider]  furosemide (LASIX) 20 MG tablet Take 20 mg by mouth 2 (two) times daily.  07/25/11  Yes [provider]  HYDROcodone-acetaminophen (NORCO) 5-325 MG tablet Take 1 tablet by mouth every 6 (six) hours as needed for moderate pain. 02/24/20  Yes Wyatt Portela, MD  labetalol (NORMODYNE) 300 MG tablet Take 300 mg by mouth 2 (two) times daily.  07/29/11  Yes [provider]  losartan (COZAAR) 100 MG tablet Take 100 mg by mouth daily.   Yes [provider]  Vitamin D, Ergocalciferol, (DRISDOL) 1.25 MG (50000 UNIT) CAPS capsule Take 50,000 Units by mouth every 14 (fourteen) days. 12/24/19  Yes [provider]  methotrexate (RHEUMATREX) 2.5 MG tablet Take 25 mg by mouth every Wednesday.     [provider]     Vital Signs: BP 139/84   Pulse 63   Temp 98.3 F (36.8 C) (Oral)   Resp 18   SpO2 97%   Physical Exam awake, alert.  Chest clear to auscultation bilaterally.  Heart with regular rate and rhythm.  Abdomen soft, positive bowel sounds, nontender.  No lower extremity edema.  Imaging: No results found.  Labs:  CBC: Recent Labs    02/05/20 1009 02/11/20 1149 03/02/20 0744  WBC 4.7 4.6 7.7  HGB 11.3* 11.2* 12.0*  HCT 33.5* 31.7* 34.7*  PLT 156 161 193    COAGS: Recent Labs    02/05/20 1009 03/02/20 0744  INR 1.2 1.1  APTT 36  --  BMP: Recent Labs    02/11/20 1149  NA 135  K 4.0  CL 101  CO2 24  GLUCOSE 172*  BUN 21  CALCIUM 9.5  CREATININE 1.73*  GFRNONAA 40*    LIVER FUNCTION TESTS: Recent Labs    02/11/20 1149  BILITOT 0.4  AST 16  ALT 20  ALKPHOS 56  PROT 9.1*  ALBUMIN 3.5    Assessment and Plan: Pt with  past medical history significant for hypertension, prior PE, rheumatoid arthritis, and right renal cell carcinoma 2018, status partial right nephrectomy. L5 lytic lesion biopsy by IR on 02/05/20 yielded plasma cell neoplasm/disorder.  He presents again  today for CT-guided bone marrow biopsy for further evaluation/ rule out myeloma. Risks and benefits of procedure was discussed with the patient /spouse family including, but not limited to bleeding, infection, damage to adjacent structures or low yield requiring additional tests.  All of the questions were answered and there is agreement to proceed.  Consent signed and in chart.     Electronically Signed: D. Rowe Robert, PA-C 03/02/2020, 8:22 AM   I spent a total of 25 minutes at the the patient's bedside AND on the patient's hospital floor or unit, greater than 50% of which was counseling/coordinating care for CT-guided bone marrow biopsy

## 2020-03-02 NOTE — Progress Notes (Addendum)
Histology and Location of Primary Cancer: Multiple myeloma vs renal cell carcinoma  Sites of Visceral and Bony Metastatic Disease: bony  Location(s) of Symptomatic Metastases: L5 and S1  Past/Anticipated chemotherapy by medical oncology, if any:  To complete his work-up he will require a bone marrow biopsy and obtaining cytogenetic panel for prognostic purposes.  I would still recommend treating with active radiation therapy and use systemic therapy subsequent to that.  Given his kidney function, Velcade-based regimen likely preferred to Revlimid.  Pain on a scale of 0-10 is: Reports low back and bottom pain. Reports pain increases with movement. Prescribed hydrocodone by Dr. Alen Blew. Patient reports the hydrocodone only provides him relief for two hours or so.    If Spine Met(s), symptoms, if any, include:  Bowel/Bladder retention or incontinence (please describe): denies  Numbness or weakness in extremities (please describe): Denies numbness or tingling in his lower extremities. Endorses bilateral lower extremity weakness.  Current Decadron regimen, if applicable: denies  Ambulatory status? Walker? Wheelchair?: Ambulatory with Assistance  SAFETY ISSUES:  Prior radiation? denies  Pacemaker/ICD? denies  Possible current pregnancy? no, male patient  Is the patient on methotrexate? yes  Current Complaints / other details:  79 year old male.

## 2020-03-03 ENCOUNTER — Other Ambulatory Visit: Payer: Self-pay | Admitting: Urology

## 2020-03-03 ENCOUNTER — Inpatient Hospital Stay: Payer: Medicare HMO | Attending: Oncology | Admitting: Oncology

## 2020-03-03 ENCOUNTER — Ambulatory Visit
Admission: RE | Admit: 2020-03-03 | Discharge: 2020-03-03 | Disposition: A | Discharge status: Home / Self Care | Payer: Medicare HMO | Source: Ambulatory Visit | Attending: Radiation Oncology | Admitting: Radiation Oncology

## 2020-03-03 ENCOUNTER — Encounter: Payer: Self-pay | Admitting: Radiation Oncology

## 2020-03-03 ENCOUNTER — Other Ambulatory Visit: Payer: Self-pay

## 2020-03-03 VITALS — BP 173/96 | HR 68 | Temp 97.9°F | Resp 18 | Ht 70.0 in | Wt 175.6 lb

## 2020-03-03 VITALS — BP 158/92 | HR 67 | Temp 97.6°F | Resp 17 | Ht 70.0 in | Wt 176.2 lb

## 2020-03-03 DIAGNOSIS — C7952 Secondary malignant neoplasm of bone marrow: Secondary | ICD-10-CM

## 2020-03-03 DIAGNOSIS — Z87891 Personal history of nicotine dependence: Secondary | ICD-10-CM | POA: Diagnosis not present

## 2020-03-03 DIAGNOSIS — I10 Essential (primary) hypertension: Secondary | ICD-10-CM | POA: Insufficient documentation

## 2020-03-03 DIAGNOSIS — C649 Malignant neoplasm of unspecified kidney, except renal pelvis: Secondary | ICD-10-CM | POA: Insufficient documentation

## 2020-03-03 DIAGNOSIS — M545 Low back pain, unspecified: Secondary | ICD-10-CM | POA: Diagnosis not present

## 2020-03-03 DIAGNOSIS — Z860100 Personal history of colon polyps, unspecified: Secondary | ICD-10-CM | POA: Insufficient documentation

## 2020-03-03 DIAGNOSIS — C9000 Multiple myeloma not having achieved remission: Secondary | ICD-10-CM

## 2020-03-03 DIAGNOSIS — M069 Rheumatoid arthritis, unspecified: Secondary | ICD-10-CM | POA: Insufficient documentation

## 2020-03-03 DIAGNOSIS — Z85528 Personal history of other malignant neoplasm of kidney: Secondary | ICD-10-CM | POA: Insufficient documentation

## 2020-03-03 DIAGNOSIS — E669 Obesity, unspecified: Secondary | ICD-10-CM | POA: Insufficient documentation

## 2020-03-03 DIAGNOSIS — D509 Iron deficiency anemia, unspecified: Secondary | ICD-10-CM | POA: Insufficient documentation

## 2020-03-03 DIAGNOSIS — M129 Arthropathy, unspecified: Secondary | ICD-10-CM | POA: Diagnosis not present

## 2020-03-03 DIAGNOSIS — E7800 Pure hypercholesterolemia, unspecified: Secondary | ICD-10-CM | POA: Insufficient documentation

## 2020-03-03 DIAGNOSIS — E559 Vitamin D deficiency, unspecified: Secondary | ICD-10-CM | POA: Insufficient documentation

## 2020-03-03 DIAGNOSIS — Z905 Acquired absence of kidney: Secondary | ICD-10-CM | POA: Insufficient documentation

## 2020-03-03 DIAGNOSIS — C7951 Secondary malignant neoplasm of bone: Secondary | ICD-10-CM

## 2020-03-03 DIAGNOSIS — I712 Thoracic aortic aneurysm, without rupture, unspecified: Secondary | ICD-10-CM | POA: Insufficient documentation

## 2020-03-03 DIAGNOSIS — M899 Disorder of bone, unspecified: Secondary | ICD-10-CM | POA: Insufficient documentation

## 2020-03-03 DIAGNOSIS — Z79899 Other long term (current) drug therapy: Secondary | ICD-10-CM | POA: Insufficient documentation

## 2020-03-03 DIAGNOSIS — Z86711 Personal history of pulmonary embolism: Secondary | ICD-10-CM | POA: Diagnosis not present

## 2020-03-03 DIAGNOSIS — N289 Disorder of kidney and ureter, unspecified: Secondary | ICD-10-CM | POA: Diagnosis not present

## 2020-03-03 DIAGNOSIS — R7303 Prediabetes: Secondary | ICD-10-CM | POA: Insufficient documentation

## 2020-03-03 DIAGNOSIS — E119 Type 2 diabetes mellitus without complications: Secondary | ICD-10-CM | POA: Insufficient documentation

## 2020-03-03 DIAGNOSIS — N183 Chronic kidney disease, stage 3 unspecified: Secondary | ICD-10-CM | POA: Insufficient documentation

## 2020-03-03 DIAGNOSIS — M1000 Idiopathic gout, unspecified site: Secondary | ICD-10-CM | POA: Insufficient documentation

## 2020-03-03 DIAGNOSIS — N529 Male erectile dysfunction, unspecified: Secondary | ICD-10-CM | POA: Insufficient documentation

## 2020-03-03 DIAGNOSIS — I700 Atherosclerosis of aorta: Secondary | ICD-10-CM | POA: Insufficient documentation

## 2020-03-03 HISTORY — DX: Malignant neoplasm of unspecified kidney, except renal pelvis: C64.9

## 2020-03-03 MED ORDER — MORPHINE SULFATE 15 MG PO TABS: 15.0000 mg | ORAL_TABLET | ORAL | 0 refills | Status: DC | PRN

## 2020-03-03 NOTE — Progress Notes (Signed)
Hematology and Oncology Follow Up Visit  Devin [name] [id number] 10-Jan-1941 79 y.o. 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  He was found to have an IgA level 2579, M spike of 2.5 g/dL and bone involvement.  Bone marrow biopsy is currently pending.   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   Prior Therapy:   He status post right robotic laparoscopic partial nephrectomy on October 03, 2016.  Current therapy:  Under evaluation to receive palliative radiation therapy. He is also under consideration start systemic therapy.  Interim History: Devin [name] returns today for a follow-up visit. Since the last visit, he has reported increase in his back pain since his spinal biopsy but was able to withstand a bone marrow biopsy without any complications. He was prescribed hydrocodone which has helped his pain although it does not appear to be lasting for an extended period of time. He still able to ambulate without any difficulties. Pain is predominantly in the right lower back and buttocks.    Medications: I have reviewed the patient's current medications.  Current Outpatient Medications  Medication Sig Dispense Refill  . acetaminophen (TYLENOL) 500 MG tablet Take 1,000 mg by mouth daily as needed for mild pain or moderate pain.    Marland Kitchen allopurinol (ZYLOPRIM) 300 MG tablet Take 300 mg by mouth daily.     Marland Kitchen amLODipine (NORVASC) 5 MG tablet Take 5 mg by mouth daily.     . folic acid (FOLVITE) 1 MG tablet Take 1 mg by mouth daily.    . furosemide (LASIX) 20 MG tablet Take 20 mg by mouth 2 (two) times daily.     Marland Kitchen HYDROcodone-acetaminophen (NORCO) 5-325 MG tablet Take 1 tablet by mouth every 6 (six) hours as needed for moderate pain. 30 tablet 0  . labetalol (NORMODYNE) 300 MG tablet Take 300 mg by mouth 2 (two) times  daily.     Marland Kitchen losartan (COZAAR) 100 MG tablet Take 100 mg by mouth daily.    . methotrexate (RHEUMATREX) 2.5 MG tablet Take 25 mg by mouth every Wednesday.     . ondansetron (ZOFRAN-ODT) 4 MG disintegrating tablet     . predniSONE (STERAPRED UNI-PAK 48 TAB) 5 MG (48) TBPK tablet     . rosuvastatin (CRESTOR) 5 MG tablet 1 tablet    . Vitamin D, Ergocalciferol, (DRISDOL) 1.25 MG (50000 UNIT) CAPS capsule Take 50,000 Units by mouth every 14 (fourteen) days.     No current facility-administered medications for this visit.     Allergies:  Allergies  Allergen Reactions  . Pravastatin Other (See Comments)    Joint pain  . Zestril [Lisinopril] Other (See Comments)    Gout   ( Zestril) gout  . Amlodipine Other (See Comments)  . Oxycodone Other (See Comments)    Causes agitation     Past Medical History, Surgical history, Social history, and Family History were reviewed and updated.  Review of Systems:  Remaining ROS negative.  Physical Exam: There were no vitals taken for this visit. ECOG:  General appearance: alert and cooperative appeared without distress. Head: Normocephalic, without obvious abnormality Oropharynx: No oral thrush or ulcers. Eyes: No scleral icterus.  Pupils are equal and round reactive to light. Lymph nodes: Cervical, supraclavicular, and axillary nodes normal. Heart:regular rate and rhythm, S1, S2 normal, no murmur, click, rub or gallop Lung:chest clear, no  wheezing, rales, normal symmetric air entry Abdomin: soft, non-tender, without masses or organomegaly. Neurological: No motor, sensory deficits.  Intact deep tendon reflexes. Skin: No rashes or lesions.  No ecchymosis or petechiae. Musculoskeletal: No joint deformity or effusion. Psychiatric: Mood and affect are appropriate.    Lab Results: Lab Results  Component Value Date   WBC 7.7 03/02/2020   HGB 12.0 (L) 03/02/2020   HCT 34.7 (L) 03/02/2020   MCV 103.6 (H) 03/02/2020   PLT 193 03/02/2020      Chemistry      Component Value Date/Time   NA 135 02/11/2020 1149   K 4.0 02/11/2020 1149   CL 101 02/11/2020 1149   CO2 24 02/11/2020 1149   BUN 21 02/11/2020 1149   CREATININE 1.73 (H) 02/11/2020 1149      Component Value Date/Time   CALCIUM 9.5 02/11/2020 1149   ALKPHOS 56 02/11/2020 1149   AST 16 02/11/2020 1149   ALT 20 02/11/2020 1149   BILITOT 0.4 02/11/2020 1149        Results for Devin [name], Devin [name] (MRN [id number]) as of 03/03/2020 14:42  Ref. Range 02/11/2020 11:49  M Protein SerPl Elph-Mcnc Latest Ref Range: Not Observed g/dL 2.5 (H)  IFE 1 Unknown Comment (A)  Globulin, Total Latest Ref Range: 2.2 - 3.9 g/dL 4.9 (H)  B-Globulin SerPl Elph-Mcnc Latest Ref Range: 0.7 - 1.3 g/dL 0.8  IgG (Immunoglobin G), Serum Latest Ref Range: 603 - 1,613 mg/dL 415 (L)  IgM (Immunoglobulin M), Srm Latest Ref Range: 15 - 143 mg/dL 9 (L)  IgA Latest Ref Range: 61 - 437 mg/dL 2,579 (H)     Impression and Plan:  79 year old with:  1. 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 He was found to have M spike of 2.5 g/dL, IgA level of 2579 in addition to elevated kappa free light chain to 1318. He has also bone involvement and mild worsening in his kidney function.  The natural course of this disease was reviewed at this time. His bone marrow biopsy is currently pending but will likely confirm the diagnosis. Treatment options were reviewed at this time. Systemic therapy utilizing Revlimid or Velcade or possibly both with dexamethasone remains the treatment of choice. Given his abundance of light chain disease as well as renal insufficiency, I favor Velcade-based therapy with dexamethasone to be started in the near future upon completing radiation therapy.  Complication associated with this therapy to include nausea, vomiting, neuropathy and the herpes zoster reactivation.  I anticipate starting this treatment in the next few weeks after completing radiation therapy.   2. Back  pain: Appears to be exacerbated by the biopsy but likely related to his Lyrica bone lesions. He is under consideration to start palliative radiation therapy under the care of Dr. Tammi Klippel. Prescription for hydrocodone is available to him.  3. Chromophobe kidney cancer: Diagnosed in 2018 underwent partial nephrectomy. No evidence of disease relapse.  4. Follow-up: Will be in the near future to evaluate the start of chemotherapy.  30  minutes were spent on this encounter. Time was dedicated to reviewing pathology results, disease status update treatment options for the future.     Zola Button, MD 12/14/20213:30 PM

## 2020-03-03 NOTE — Progress Notes (Signed)
Radiation Oncology         [phone]) [phone] ________________________________  Initial outpatient Consultation  Name: Devin [name] MRN: [id number]  Date of Service: 03/03/2020 DOB: 14-Nov-1940  IW:[id number], [name], MD  Wyatt Portela, MD   REFERRING PHYSICIAN: Wyatt Portela, MD  DIAGNOSIS: 79 year old male with painful bony lesions in the low back secondary to newly diagnosed multiple myeloma and a history of renal cell carcinoma.    ICD-10-CM   1. Secondary malignant neoplasm of bone and bone marrow (HCC)  C79.51    C79.52   2. Multiple myeloma, remission status unspecified (HCC)  C90.00     HISTORY OF PRESENT ILLNESS: Devin [name] is a 79 y.o. male seen at the request of Dr. Alen Blew. He has a history of renal cell carcinoma, diagnosed in 2018. He is s/p right partial nephrectomy on 10/03/2016 with Dr. Alinda Money showing chromophobe type renal cell carcinoma. He has been followed in surveillance with [facility] since that time with annual CT A/P.  His most recent CT A/P performed on 12/27/2019 showed no evidence of locally recurrent renal mass or suspicious contrast enhancement but there was a new lytic lesion of the L5 vertebral body with superior endplate deformity, concerning for pathologic fracture and underlying lesion as well as an unchanged lucent lesion of left posterior ilium. This prompted further evaluation with lumbar spine MRI performed on 01/08/2020 and demonstrating findings compatible with osseous metastatic disease involving L5 and likely additional smaller metastases involving S1 and S2 without any definite epidural extension of tumor.  The irregular deformity of the L5 superior endplate remains concerning for pathologic fracture superimposed on degenerative change.  The patient was feeling well, with no pain or complaints.  He was referred to Dr. Alen Blew on 01/21/2020 and underwent PET scan on 02/03/2020. PET scan showed lytic metastases at L5 and S1-2 as well as additional  suspected metastasis involving the right lateral 2nd rib.  A core needle biopsy of L5 was performed on 02/05/2020, with pathology revealing plasma cell neoplasm.  Since the time of biopsy, he has had fairly severe pain in his low back and radiating into the lower extremities.  He denies any changes in his bowel or bladder function, paresthesias or focal weakness in the lower extremities.  He had labs performed on 02/11/2020 with findings highly suggestive of multiple myeloma.  Therefore, he underwent a bone marrow biopsy on 03/02/2020, however the pathology results are still pending at this time.  He has reviewed his recent workup with Dr. Alen Blew, and he has been kindly referred to [place] today to discuss potential palliative radiation treatment to the painful spinal metastases.  PREVIOUS RADIATION THERAPY: No  PAST MEDICAL HISTORY:  Past Medical History:  Diagnosis Date  . Arthritis   . History of pulmonary embolism 04/2016  . Hypertension   . Renal cell carcinoma ([place])       PAST SURGICAL HISTORY: Past Surgical History:  Procedure Laterality Date  . APPENDECTOMY    . DENTAL SURGERY  03/2016   all upper teeth pulled and dentures.   Marland Kitchen HERNIA REPAIR  1985&2001  . IR FLUORO GUIDED NEEDLE PLC ASPIRATION/INJECTION LOC  02/05/2020  . right knee replacement   04/2016  . right wrist plate due to fracture     . ROBOT ASSISTED LAPAROSCOPIC NEPHRECTOMY Right 10/03/2016   Procedure: XI ROBOTIC ASSISTED LAPAROSCOPIC  PARTIAL NEPHRECTOMY;  Surgeon: Raynelle Bring, MD;  Location: [facility];  Service: Urology;  Laterality: Right;    FAMILY  HISTORY:  Family History  Problem Relation Age of Onset  . Cancer Mother        bone  . Breast cancer Neg Hx   . Colon cancer Neg Hx   . Pancreatic cancer Neg Hx   . Prostate cancer Neg Hx     SOCIAL HISTORY:  Social History   Socioeconomic History  . Marital status: Married    Spouse name: Not on file  . Number of children: Not on file  . Years of  education: Not on file  . Highest education level: Not on file  Occupational History    Comment: retired  Tobacco Use  . Smoking status: Former Research scientist (life sciences)  . Smokeless tobacco: Former Systems developer    Quit date: 08/09/1985  Vaping Use  . Vaping Use: Never used  Substance and Sexual Activity  . Alcohol use: Yes    Comment: occ beer   . Drug use: No  . Sexual activity: Not Currently  Other Topics Concern  . Not on file  Social History Narrative  . Not on file   Social Determinants of Health   Financial Resource Strain: Not on file  Food Insecurity: Not on file  Transportation Needs: Not on file  Physical Activity: Not on file  Stress: Not on file  Social Connections: Not on file  Intimate Partner Violence: Not on file    ALLERGIES: Pravastatin, Zestril [lisinopril], Amlodipine, and Oxycodone  MEDICATIONS:  Current Outpatient Medications  Medication Sig Dispense Refill  . acetaminophen (TYLENOL) 500 MG tablet Take 1,000 mg by mouth daily as needed for mild pain or moderate pain.    Marland Kitchen allopurinol (ZYLOPRIM) 300 MG tablet Take 300 mg by mouth daily.     Marland Kitchen amLODipine (NORVASC) 5 MG tablet Take 5 mg by mouth daily.     . folic acid (FOLVITE) 1 MG tablet Take 1 mg by mouth daily.    . furosemide (LASIX) 20 MG tablet Take 20 mg by mouth 2 (two) times daily.     Marland Kitchen HYDROcodone-acetaminophen (NORCO) 5-325 MG tablet Take 1 tablet by mouth every 6 (six) hours as needed for moderate pain. 30 tablet 0  . labetalol (NORMODYNE) 300 MG tablet Take 300 mg by mouth 2 (two) times daily.     Marland Kitchen losartan (COZAAR) 100 MG tablet Take 100 mg by mouth daily.    . methotrexate (RHEUMATREX) 2.5 MG tablet Take 25 mg by mouth every Wednesday.     . ondansetron (ZOFRAN-ODT) 4 MG disintegrating tablet     . predniSONE (STERAPRED UNI-PAK 48 TAB) 5 MG (48) TBPK tablet     . rosuvastatin (CRESTOR) 5 MG tablet 1 tablet    . Vitamin D, Ergocalciferol, (DRISDOL) 1.25 MG (50000 UNIT) CAPS capsule Take 50,000 Units by mouth  every 14 (fourteen) days.    Marland Kitchen morphine (MSIR) 15 MG tablet Take 1 tablet (15 mg total) by mouth every 4 (four) hours as needed for severe pain. 30 tablet 0   No current facility-administered medications for this encounter.    REVIEW OF SYSTEMS:  On review of systems, the patient reports that he is doing well overall. He denies any chest pain, shortness of breath, cough, fevers, chills, night sweats, unintended weight changes. He denies any bowel or bladder disturbances, and denies abdominal pain, nausea or vomiting. He reports low back and bottom pain that increases with movement. He was prescribed hydrocodone by Dr. Alen Blew but reports this only provides him relief for two hours or so. He also endorses  bilateral lower extremity weakness but denies numbness or tingling. A complete review of systems is obtained and is otherwise negative.    PHYSICAL EXAM:  Wt Readings from Last 3 Encounters:  03/03/20 175 lb 9.6 oz (79.7 kg)  03/03/20 176 lb 3.2 oz (79.9 kg)  02/07/20 187 lb 3.2 oz (84.9 kg)   Temp Readings from Last 3 Encounters:  03/03/20 97.9 F (36.6 C) (Temporal)  03/03/20 97.6 F (36.4 C) (Tympanic)  03/02/20 98 F (36.7 C) (Oral)   BP Readings from Last 3 Encounters:  03/03/20 (!) 173/96  03/03/20 (!) 158/92  03/02/20 119/76   Pulse Readings from Last 3 Encounters:  03/03/20 68  03/03/20 67  03/02/20 64   Pain Assessment Pain Score: 10-Worst pain ever Pain Frequency: Constant Pain Loc: Back (low back and buttock)/10  In general this is a well appearing Caucasian male in no acute distress. He is alert and oriented x4 and appropriate throughout the examination. HEENT reveals that the patient is normocephalic, atraumatic. EOMs are intact. PERRLA. Skin is intact without any evidence of gross lesions. Cardiopulmonary assessment is negative for acute distress and he exhibits normal effort. The abdomen is soft, non tender, non distended. Lower extremities are negative for  pretibial pitting edema, deep calf tenderness, cyanosis or clubbing.   KPS = 80  100 - Normal; no complaints; no evidence of disease. 90   - Able to carry on normal activity; minor signs or symptoms of disease. 80   - Normal activity with effort; some signs or symptoms of disease. 14   - Cares for self; unable to carry on normal activity or to do active work. 60   - Requires occasional assistance, but is able to care for most of his personal needs. 50   - Requires considerable assistance and frequent medical care. 52   - Disabled; requires special care and assistance. 69   - Severely disabled; hospital admission is indicated although death not imminent. 27   - Very sick; hospital admission necessary; active supportive treatment necessary. 10   - Moribund; fatal processes progressing rapidly. 0     - Dead  Karnofsky DA, Abelmann Stella, Craver LS and Burchenal [facility] [phone]) The use of the nitrogen mustards in the palliative treatment of carcinoma: with particular reference to bronchogenic carcinoma Cancer 1 634-56  LABORATORY DATA:  Lab Results  Component Value Date   WBC 7.7 03/02/2020   HGB 12.0 (L) 03/02/2020   HCT 34.7 (L) 03/02/2020   MCV 103.6 (H) 03/02/2020   PLT 193 03/02/2020   Lab Results  Component Value Date   NA 135 02/11/2020   K 4.0 02/11/2020   CL 101 02/11/2020   CO2 24 02/11/2020   Lab Results  Component Value Date   ALT 20 02/11/2020   AST 16 02/11/2020   ALKPHOS 56 02/11/2020   BILITOT 0.4 02/11/2020     RADIOGRAPHY: IR Fluoro Guide Ndl Plmt / BX  Result Date: 02/05/2020 INDICATION: History of renal cell carcinoma. Hypermetabolic lytic lesion in the L5 vertebral body and smaller lesions in the sacrum. EXAM: FLUOROSCOPIC GUIDED DEEP BONE LESION CORE BIOPSY L5 MEDICATIONS: lidocaine 1% subcutaneous ANESTHESIA/SEDATION: Intravenous Fentanyl 60mg and Versed 1.583mwere administered as conscious sedation during continuous monitoring of the patient's level of  consciousness and physiological / cardiorespiratory status by the radiology RN, with a total moderate sedation time of of 20 minutes. PROCEDURE: Informed written consent was obtained from the patient after a thorough discussion of the procedural risks, benefits  and alternatives. All questions were addressed. Maximal Sterile Barrier Technique was utilized including caps, mask, sterile gowns, sterile gloves, sterile drape, hand hygiene and skin antiseptic. A timeout was performed prior to the initiation of the procedure. Patient placed prone. Lower lumbar region prepped with chlorhexidine, draped in usual sterile fashion. An appropriate skin entry site was determined under fluoroscopy. The site was infiltrated locally and deep with 1% lidocaine. Kyphon trocar needle was advanced into the posterior aspect of the L5 vertebral body using a left transpedicular approach. Needle exchanged over guide for biopsy guide needle, advanced into the posterior aspect of the L5 vertebral body. Coaxial biopsy samples were obtained. A final biopsy was obtained through the guide needle itself which was then retrieved. Hemostasis achieved at the site. Samples submitted in formalin to surgical pathology. The patient tolerated the procedure well. FLUOROSCOPY TIME:  1 minutes 42 seconds; 41 mGy COMPLICATIONS: None immediate. IMPRESSION: 1. Technically successful fluoroscopic guided deep bone lesion core biopsy, L5 vertebral body. Electronically Signed   By: Lucrezia Europe M.D.   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 The patient requires bone marrow biopsy for further workup of multiple myeloma. EXAM: CT GUIDED BONE MARROW ASPIRATION AND BIOPSY ANESTHESIA/SEDATION: Versed 4.0 mg IV, Fentanyl 100 mcg IV Total Moderate Sedation Time:   20 minutes. The patient's level of consciousness and physiologic status were continuously monitored  during the procedure by Radiology nursing. PROCEDURE: The procedure risks, benefits, and alternatives were explained to the patient. Questions regarding the procedure were encouraged and answered. The patient understands and consents to the procedure. A time out was performed prior to initiating the procedure. The right gluteal region was prepped with chlorhexidine. Sterile gown and sterile gloves were used for the procedure. Local anesthesia was provided with 1% Lidocaine. Under CT guidance, an 11 gauge On Control bone cutting needle was advanced from a posterior approach into the right iliac bone. Needle positioning was confirmed with CT. Initial non heparinized and heparinized aspirate samples were obtained of bone marrow. Core biopsy was performed via the On Control drill needle. COMPLICATIONS: None FINDINGS: Inspection of initial aspirate did reveal visible particles. Intact core biopsy sample was obtained. IMPRESSION: CT guided bone marrow biopsy of right posterior iliac bone with both aspirate and core samples obtained. Electronically Signed   By: Aletta Edouard M.D.   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 The patient requires bone marrow biopsy for further workup of multiple myeloma. EXAM: CT GUIDED BONE MARROW ASPIRATION AND BIOPSY ANESTHESIA/SEDATION: Versed 4.0 mg IV, Fentanyl 100 mcg IV Total Moderate Sedation Time:   20 minutes. The patient's level of consciousness and physiologic status were continuously monitored during the procedure by Radiology nursing. PROCEDURE: The procedure risks, benefits, and alternatives were explained to the patient. Questions regarding the procedure were encouraged and answered. The patient understands and consents to the procedure. A time out was performed prior to initiating the procedure. The right gluteal region was prepped with  chlorhexidine. Sterile gown and sterile gloves were used for the procedure. Local anesthesia was provided with 1% Lidocaine. Under CT guidance, an 11 gauge On Control bone cutting needle was advanced from a posterior approach into the right iliac bone. Needle positioning was confirmed with CT. Initial non heparinized and heparinized aspirate samples  were obtained of bone marrow. Core biopsy was performed via the On Control drill needle. COMPLICATIONS: None FINDINGS: Inspection of initial aspirate did reveal visible particles. Intact core biopsy sample was obtained. IMPRESSION: CT guided bone marrow biopsy of right posterior iliac bone with both aspirate and core samples obtained. Electronically Signed   By: Aletta Edouard M.D.   On: 03/02/2020 12:08      IMPRESSION/PLAN: 1. 79 y.o. man with painful osseous lesions at L5, S1-2 and a right lateral second rib secondary to newly diagnosed multiple myeloma. Today, we talked to the patient and his wife about the findings and workup thus far. We discussed the natural history of multiple myeloma and general treatment, highlighting the role of radiotherapy in the management. The recommendation in his case would be for a 2-week course of daily radiotherapy to the lesions at L5, S1-2 and right lateral second rib. We discussed the available radiation techniques, and focused on the details and logistics of delivery. We reviewed the anticipated acute and late sequelae associated with radiation in this setting. The patient was encouraged to ask questions that were answered to his satisfaction.  At the end of our conversation, the patient would like to proceed with the recommended course of radiation therapy.  He has freely signed written consent to proceed today in the office and a copy of this document will be placed in his medical record.  He is tentatively scheduled for CT simulation at 9 AM on Wednesday, March 04, 2020 in anticipation of beginning his daily  treatments on Thursday, March 05, 2020.  He was advised to hold his scheduled dose of methotrexate on 03/04/2020 and resume the medication once we have completed the radiation.  We will share our discussion with Dr. Alen Blew and move forward with treatment planning accordingly.   Nicholos Johns, PA-C    Tyler Pita, MD  [facility] Direct Dial: [phone]  Fax: [phone] [email or web address]  Skype  [email or web address]   This document serves as a record of services personally performed by Tyler Pita, MD and Freeman Caldron, PA-C. It was created on their behalf by Wilburn Mylar, a trained medical scribe. The creation of this record is based on the scribe's personal observations and the provider's statements to them. This document has been checked and approved by the attending provider.

## 2020-03-04 ENCOUNTER — Ambulatory Visit
Admission: RE | Admit: 2020-03-04 | Discharge: 2020-03-04 | Disposition: A | Discharge status: Home / Self Care | Payer: Medicare HMO | Source: Ambulatory Visit | Attending: Radiation Oncology | Admitting: Radiation Oncology

## 2020-03-04 DIAGNOSIS — C9000 Multiple myeloma not having achieved remission: Secondary | ICD-10-CM | POA: Insufficient documentation

## 2020-03-04 DIAGNOSIS — Z510 Encounter for antineoplastic radiation therapy: Secondary | ICD-10-CM | POA: Insufficient documentation

## 2020-03-04 DIAGNOSIS — C7951 Secondary malignant neoplasm of bone: Secondary | ICD-10-CM | POA: Diagnosis not present

## 2020-03-04 DIAGNOSIS — C7952 Secondary malignant neoplasm of bone marrow: Secondary | ICD-10-CM | POA: Diagnosis not present

## 2020-03-04 NOTE — Progress Notes (Signed)
  Radiation Oncology         [phone] ________________________________  Name: Devin [name] MRN: [id number]  Date: 03/04/2020  DOB: Dec 18, 1940  SIMULATION AND TREATMENT PLANNING NOTE    ICD-10-CM   1. Multiple myeloma not having achieved remission ([place])  C90.00     DIAGNOSIS:  79 y.o. patient with L5, S1 and S2 painful myeloma  NARRATIVE:  The patient was brought to the [facility].  Identity was confirmed.  All relevant records and images related to the planned course of therapy were reviewed.  The patient freely provided informed written consent to proceed with treatment after reviewing the details related to the planned course of therapy. The consent form was witnessed and verified by the simulation staff.  Then, the patient was set-up in a stable reproducible  supine position for radiation therapy.  CT images were obtained.  Surface markings were placed.  The CT images were loaded into the planning software.  Then the target and avoidance structures were contoured including kidneys.  Treatment planning then occurred.  The radiation prescription was entered and confirmed.  Then, I designed and supervised the construction of a total of 3 medically necessary complex treatment devices with VacLoc positioner and 2 MLCs to shield kidneys.  I have requested : 3D Simulation  I have requested a DVH of the following structures: Left Kidney, Right Kidney and target.  PLAN:  The patient will receive 20 Gy in 10 fractions.  ________________________________  Sheral Apley Tammi Klippel, M.D.

## 2020-03-05 ENCOUNTER — Ambulatory Visit
Admission: RE | Admit: 2020-03-05 | Discharge: 2020-03-05 | Disposition: A | Discharge status: Home / Self Care | Payer: Medicare HMO | Source: Ambulatory Visit | Attending: Radiation Oncology | Admitting: Radiation Oncology

## 2020-03-05 DIAGNOSIS — C9000 Multiple myeloma not having achieved remission: Secondary | ICD-10-CM | POA: Diagnosis not present

## 2020-03-05 DIAGNOSIS — C7952 Secondary malignant neoplasm of bone marrow: Secondary | ICD-10-CM | POA: Diagnosis not present

## 2020-03-05 DIAGNOSIS — C7951 Secondary malignant neoplasm of bone: Secondary | ICD-10-CM | POA: Diagnosis not present

## 2020-03-05 DIAGNOSIS — Z510 Encounter for antineoplastic radiation therapy: Secondary | ICD-10-CM | POA: Diagnosis not present

## 2020-03-06 ENCOUNTER — Ambulatory Visit: Payer: Medicare HMO

## 2020-03-06 DIAGNOSIS — R7989 Other specified abnormal findings of blood chemistry: Secondary | ICD-10-CM | POA: Diagnosis not present

## 2020-03-06 DIAGNOSIS — R509 Fever, unspecified: Secondary | ICD-10-CM | POA: Diagnosis not present

## 2020-03-06 DIAGNOSIS — D849 Immunodeficiency, unspecified: Secondary | ICD-10-CM | POA: Diagnosis not present

## 2020-03-06 DIAGNOSIS — R0902 Hypoxemia: Secondary | ICD-10-CM | POA: Diagnosis not present

## 2020-03-06 DIAGNOSIS — J168 Pneumonia due to other specified infectious organisms: Secondary | ICD-10-CM | POA: Diagnosis not present

## 2020-03-06 DIAGNOSIS — C7951 Secondary malignant neoplasm of bone: Secondary | ICD-10-CM | POA: Diagnosis not present

## 2020-03-06 DIAGNOSIS — R0602 Shortness of breath: Secondary | ICD-10-CM | POA: Diagnosis not present

## 2020-03-06 DIAGNOSIS — M48061 Spinal stenosis, lumbar region without neurogenic claudication: Secondary | ICD-10-CM | POA: Diagnosis not present

## 2020-03-06 DIAGNOSIS — G9340 Encephalopathy, unspecified: Secondary | ICD-10-CM | POA: Diagnosis not present

## 2020-03-06 DIAGNOSIS — J698 Pneumonitis due to inhalation of other solids and liquids: Secondary | ICD-10-CM | POA: Diagnosis not present

## 2020-03-06 DIAGNOSIS — M5136 Other intervertebral disc degeneration, lumbar region: Secondary | ICD-10-CM | POA: Diagnosis not present

## 2020-03-06 DIAGNOSIS — C9000 Multiple myeloma not having achieved remission: Secondary | ICD-10-CM | POA: Diagnosis not present

## 2020-03-06 DIAGNOSIS — I2699 Other pulmonary embolism without acute cor pulmonale: Secondary | ICD-10-CM | POA: Diagnosis not present

## 2020-03-06 DIAGNOSIS — K4090 Unilateral inguinal hernia, without obstruction or gangrene, not specified as recurrent: Secondary | ICD-10-CM | POA: Diagnosis not present

## 2020-03-06 DIAGNOSIS — J9601 Acute respiratory failure with hypoxia: Secondary | ICD-10-CM | POA: Diagnosis not present

## 2020-03-06 DIAGNOSIS — I214 Non-ST elevation (NSTEMI) myocardial infarction: Secondary | ICD-10-CM | POA: Diagnosis not present

## 2020-03-06 DIAGNOSIS — J479 Bronchiectasis, uncomplicated: Secondary | ICD-10-CM | POA: Diagnosis not present

## 2020-03-06 DIAGNOSIS — R6520 Severe sepsis without septic shock: Secondary | ICD-10-CM | POA: Diagnosis not present

## 2020-03-06 DIAGNOSIS — N2889 Other specified disorders of kidney and ureter: Secondary | ICD-10-CM | POA: Diagnosis not present

## 2020-03-06 DIAGNOSIS — J849 Interstitial pulmonary disease, unspecified: Secondary | ICD-10-CM | POA: Diagnosis not present

## 2020-03-06 DIAGNOSIS — I440 Atrioventricular block, first degree: Secondary | ICD-10-CM | POA: Diagnosis not present

## 2020-03-06 DIAGNOSIS — R410 Disorientation, unspecified: Secondary | ICD-10-CM | POA: Diagnosis not present

## 2020-03-06 DIAGNOSIS — M47816 Spondylosis without myelopathy or radiculopathy, lumbar region: Secondary | ICD-10-CM | POA: Diagnosis not present

## 2020-03-06 DIAGNOSIS — A419 Sepsis, unspecified organism: Secondary | ICD-10-CM | POA: Diagnosis not present

## 2020-03-06 DIAGNOSIS — R0689 Other abnormalities of breathing: Secondary | ICD-10-CM | POA: Diagnosis not present

## 2020-03-06 DIAGNOSIS — M47817 Spondylosis without myelopathy or radiculopathy, lumbosacral region: Secondary | ICD-10-CM | POA: Diagnosis not present

## 2020-03-06 DIAGNOSIS — I2584 Coronary atherosclerosis due to calcified coronary lesion: Secondary | ICD-10-CM | POA: Diagnosis not present

## 2020-03-06 DIAGNOSIS — G929 Unspecified toxic encephalopathy: Secondary | ICD-10-CM | POA: Diagnosis not present

## 2020-03-06 DIAGNOSIS — R778 Other specified abnormalities of plasma proteins: Secondary | ICD-10-CM | POA: Diagnosis not present

## 2020-03-06 DIAGNOSIS — J189 Pneumonia, unspecified organism: Secondary | ICD-10-CM | POA: Diagnosis not present

## 2020-03-06 DIAGNOSIS — N179 Acute kidney failure, unspecified: Secondary | ICD-10-CM | POA: Diagnosis not present

## 2020-03-06 DIAGNOSIS — I2510 Atherosclerotic heart disease of native coronary artery without angina pectoris: Secondary | ICD-10-CM | POA: Diagnosis not present

## 2020-03-06 DIAGNOSIS — R404 Transient alteration of awareness: Secondary | ICD-10-CM | POA: Diagnosis not present

## 2020-03-06 DIAGNOSIS — Z20822 Contact with and (suspected) exposure to covid-19: Secondary | ICD-10-CM | POA: Diagnosis not present

## 2020-03-06 DIAGNOSIS — R918 Other nonspecific abnormal finding of lung field: Secondary | ICD-10-CM | POA: Diagnosis not present

## 2020-03-06 DIAGNOSIS — I350 Nonrheumatic aortic (valve) stenosis: Secondary | ICD-10-CM | POA: Diagnosis not present

## 2020-03-06 DIAGNOSIS — T50904A Poisoning by unspecified drugs, medicaments and biological substances, undetermined, initial encounter: Secondary | ICD-10-CM | POA: Diagnosis not present

## 2020-03-07 DIAGNOSIS — N179 Acute kidney failure, unspecified: Secondary | ICD-10-CM | POA: Diagnosis not present

## 2020-03-07 DIAGNOSIS — I2510 Atherosclerotic heart disease of native coronary artery without angina pectoris: Secondary | ICD-10-CM | POA: Diagnosis not present

## 2020-03-07 DIAGNOSIS — J9601 Acute respiratory failure with hypoxia: Secondary | ICD-10-CM | POA: Diagnosis not present

## 2020-03-07 DIAGNOSIS — R778 Other specified abnormalities of plasma proteins: Secondary | ICD-10-CM | POA: Diagnosis not present

## 2020-03-07 DIAGNOSIS — J849 Interstitial pulmonary disease, unspecified: Secondary | ICD-10-CM | POA: Diagnosis not present

## 2020-03-07 DIAGNOSIS — G9340 Encephalopathy, unspecified: Secondary | ICD-10-CM | POA: Diagnosis not present

## 2020-03-07 DIAGNOSIS — I350 Nonrheumatic aortic (valve) stenosis: Secondary | ICD-10-CM | POA: Diagnosis not present

## 2020-03-07 DIAGNOSIS — J189 Pneumonia, unspecified organism: Secondary | ICD-10-CM | POA: Diagnosis not present

## 2020-03-07 DIAGNOSIS — I2584 Coronary atherosclerosis due to calcified coronary lesion: Secondary | ICD-10-CM | POA: Diagnosis not present

## 2020-03-07 DIAGNOSIS — C9000 Multiple myeloma not having achieved remission: Secondary | ICD-10-CM | POA: Diagnosis not present

## 2020-03-08 DIAGNOSIS — I2584 Coronary atherosclerosis due to calcified coronary lesion: Secondary | ICD-10-CM | POA: Diagnosis not present

## 2020-03-08 DIAGNOSIS — J849 Interstitial pulmonary disease, unspecified: Secondary | ICD-10-CM | POA: Diagnosis not present

## 2020-03-08 DIAGNOSIS — G9340 Encephalopathy, unspecified: Secondary | ICD-10-CM | POA: Diagnosis not present

## 2020-03-08 DIAGNOSIS — I350 Nonrheumatic aortic (valve) stenosis: Secondary | ICD-10-CM | POA: Diagnosis not present

## 2020-03-08 DIAGNOSIS — J9601 Acute respiratory failure with hypoxia: Secondary | ICD-10-CM | POA: Diagnosis not present

## 2020-03-08 DIAGNOSIS — C9000 Multiple myeloma not having achieved remission: Secondary | ICD-10-CM | POA: Diagnosis not present

## 2020-03-08 DIAGNOSIS — I2510 Atherosclerotic heart disease of native coronary artery without angina pectoris: Secondary | ICD-10-CM | POA: Diagnosis not present

## 2020-03-08 DIAGNOSIS — N179 Acute kidney failure, unspecified: Secondary | ICD-10-CM | POA: Diagnosis not present

## 2020-03-08 DIAGNOSIS — R778 Other specified abnormalities of plasma proteins: Secondary | ICD-10-CM | POA: Diagnosis not present

## 2020-03-08 DIAGNOSIS — J189 Pneumonia, unspecified organism: Secondary | ICD-10-CM | POA: Diagnosis not present

## 2020-03-09 ENCOUNTER — Ambulatory Visit: Payer: Medicare HMO

## 2020-03-09 DIAGNOSIS — R778 Other specified abnormalities of plasma proteins: Secondary | ICD-10-CM | POA: Diagnosis not present

## 2020-03-09 DIAGNOSIS — R7989 Other specified abnormal findings of blood chemistry: Secondary | ICD-10-CM | POA: Diagnosis not present

## 2020-03-09 DIAGNOSIS — I2584 Coronary atherosclerosis due to calcified coronary lesion: Secondary | ICD-10-CM | POA: Diagnosis not present

## 2020-03-09 DIAGNOSIS — I350 Nonrheumatic aortic (valve) stenosis: Secondary | ICD-10-CM | POA: Diagnosis not present

## 2020-03-09 DIAGNOSIS — G9340 Encephalopathy, unspecified: Secondary | ICD-10-CM | POA: Diagnosis not present

## 2020-03-09 DIAGNOSIS — C9000 Multiple myeloma not having achieved remission: Secondary | ICD-10-CM | POA: Diagnosis not present

## 2020-03-09 DIAGNOSIS — N179 Acute kidney failure, unspecified: Secondary | ICD-10-CM | POA: Diagnosis not present

## 2020-03-09 DIAGNOSIS — I2510 Atherosclerotic heart disease of native coronary artery without angina pectoris: Secondary | ICD-10-CM | POA: Diagnosis not present

## 2020-03-09 DIAGNOSIS — J189 Pneumonia, unspecified organism: Secondary | ICD-10-CM | POA: Diagnosis not present

## 2020-03-09 DIAGNOSIS — J849 Interstitial pulmonary disease, unspecified: Secondary | ICD-10-CM | POA: Diagnosis not present

## 2020-03-09 DIAGNOSIS — J9601 Acute respiratory failure with hypoxia: Secondary | ICD-10-CM | POA: Diagnosis not present

## 2020-03-09 DIAGNOSIS — R0602 Shortness of breath: Secondary | ICD-10-CM | POA: Diagnosis not present

## 2020-03-10 ENCOUNTER — Telehealth: Payer: Self-pay | Admitting: Radiation Oncology

## 2020-03-10 ENCOUNTER — Ambulatory Visit
Admission: RE | Admit: 2020-03-10 | Discharge: 2020-03-10 | Disposition: A | Discharge status: Home / Self Care | Payer: Medicare HMO | Source: Ambulatory Visit | Attending: Radiation Oncology | Admitting: Radiation Oncology

## 2020-03-10 DIAGNOSIS — C7951 Secondary malignant neoplasm of bone: Secondary | ICD-10-CM | POA: Diagnosis not present

## 2020-03-10 DIAGNOSIS — C7952 Secondary malignant neoplasm of bone marrow: Secondary | ICD-10-CM | POA: Diagnosis not present

## 2020-03-10 DIAGNOSIS — C9000 Multiple myeloma not having achieved remission: Secondary | ICD-10-CM | POA: Diagnosis not present

## 2020-03-10 DIAGNOSIS — Z510 Encounter for antineoplastic radiation therapy: Secondary | ICD-10-CM | POA: Diagnosis not present

## 2020-03-10 NOTE — Telephone Encounter (Signed)
03/09/20 at 0830. Received a voicemail message update from patient's wife, Devin [name]. She reports her husband, Devin [name], remains in the [facility] and won't make radiation treatment today. She reports her husband was diagnosed with double pneumonia and a cracked L5 vertebrae. Will update the team.

## 2020-03-11 ENCOUNTER — Other Ambulatory Visit: Payer: Self-pay

## 2020-03-11 ENCOUNTER — Ambulatory Visit
Admission: RE | Admit: 2020-03-11 | Discharge: 2020-03-11 | Disposition: A | Discharge status: Home / Self Care | Payer: Medicare HMO | Source: Ambulatory Visit | Attending: Radiation Oncology | Admitting: Radiation Oncology

## 2020-03-11 DIAGNOSIS — Z510 Encounter for antineoplastic radiation therapy: Secondary | ICD-10-CM | POA: Diagnosis not present

## 2020-03-11 DIAGNOSIS — C9000 Multiple myeloma not having achieved remission: Secondary | ICD-10-CM | POA: Diagnosis not present

## 2020-03-11 DIAGNOSIS — C7951 Secondary malignant neoplasm of bone: Secondary | ICD-10-CM | POA: Diagnosis not present

## 2020-03-11 DIAGNOSIS — C7952 Secondary malignant neoplasm of bone marrow: Secondary | ICD-10-CM | POA: Diagnosis not present

## 2020-03-12 ENCOUNTER — Other Ambulatory Visit: Payer: Self-pay | Admitting: Radiation Oncology

## 2020-03-12 ENCOUNTER — Other Ambulatory Visit: Payer: Self-pay

## 2020-03-12 ENCOUNTER — Encounter (HOSPITAL_COMMUNITY): Payer: Self-pay | Admitting: Oncology

## 2020-03-12 ENCOUNTER — Ambulatory Visit
Admission: RE | Admit: 2020-03-12 | Discharge: 2020-03-12 | Disposition: A | Discharge status: Home / Self Care | Payer: Medicare HMO | Source: Ambulatory Visit | Attending: Radiation Oncology | Admitting: Radiation Oncology

## 2020-03-12 DIAGNOSIS — C9000 Multiple myeloma not having achieved remission: Secondary | ICD-10-CM | POA: Diagnosis not present

## 2020-03-12 DIAGNOSIS — Z510 Encounter for antineoplastic radiation therapy: Secondary | ICD-10-CM | POA: Diagnosis not present

## 2020-03-12 DIAGNOSIS — C7952 Secondary malignant neoplasm of bone marrow: Secondary | ICD-10-CM | POA: Diagnosis not present

## 2020-03-12 DIAGNOSIS — C7951 Secondary malignant neoplasm of bone: Secondary | ICD-10-CM | POA: Diagnosis not present

## 2020-03-12 MED ORDER — MORPHINE SULFATE 15 MG PO TABS: 15.0000 mg | ORAL_TABLET | ORAL | 0 refills | Status: DC | PRN

## 2020-03-12 MED ORDER — MORPHINE SULFATE ER 15 MG PO TBCR
15.0000 mg | EXTENDED_RELEASE_TABLET | Freq: Two times a day (BID) | ORAL | 0 refills | Status: DC
Start: 2020-03-12 — End: 2020-04-17

## 2020-03-16 ENCOUNTER — Other Ambulatory Visit: Payer: Self-pay

## 2020-03-16 ENCOUNTER — Ambulatory Visit
Admission: RE | Admit: 2020-03-16 | Discharge: 2020-03-16 | Disposition: A | Discharge status: Home / Self Care | Payer: Medicare HMO | Source: Ambulatory Visit | Attending: Radiation Oncology | Admitting: Radiation Oncology

## 2020-03-16 ENCOUNTER — Encounter (HOSPITAL_COMMUNITY): Payer: Self-pay | Admitting: Oncology

## 2020-03-16 DIAGNOSIS — C9000 Multiple myeloma not having achieved remission: Secondary | ICD-10-CM | POA: Diagnosis not present

## 2020-03-16 DIAGNOSIS — C7952 Secondary malignant neoplasm of bone marrow: Secondary | ICD-10-CM | POA: Diagnosis not present

## 2020-03-16 DIAGNOSIS — Z510 Encounter for antineoplastic radiation therapy: Secondary | ICD-10-CM | POA: Diagnosis not present

## 2020-03-16 DIAGNOSIS — C7951 Secondary malignant neoplasm of bone: Secondary | ICD-10-CM | POA: Diagnosis not present

## 2020-03-17 ENCOUNTER — Ambulatory Visit
Admission: RE | Admit: 2020-03-17 | Discharge: 2020-03-17 | Disposition: A | Discharge status: Home / Self Care | Payer: Medicare HMO | Source: Ambulatory Visit | Attending: Radiation Oncology | Admitting: Radiation Oncology

## 2020-03-17 ENCOUNTER — Other Ambulatory Visit: Payer: Self-pay

## 2020-03-17 ENCOUNTER — Inpatient Hospital Stay (HOSPITAL_BASED_OUTPATIENT_CLINIC_OR_DEPARTMENT_OTHER): Payer: Medicare HMO | Admitting: Oncology

## 2020-03-17 VITALS — BP 136/64 | HR 118 | Temp 97.7°F | Resp 18 | Ht 70.0 in | Wt 174.5 lb

## 2020-03-17 DIAGNOSIS — Z510 Encounter for antineoplastic radiation therapy: Secondary | ICD-10-CM | POA: Diagnosis not present

## 2020-03-17 DIAGNOSIS — C9000 Multiple myeloma not having achieved remission: Secondary | ICD-10-CM

## 2020-03-17 DIAGNOSIS — M545 Low back pain, unspecified: Secondary | ICD-10-CM | POA: Diagnosis not present

## 2020-03-17 DIAGNOSIS — Z905 Acquired absence of kidney: Secondary | ICD-10-CM | POA: Diagnosis not present

## 2020-03-17 DIAGNOSIS — N289 Disorder of kidney and ureter, unspecified: Secondary | ICD-10-CM | POA: Diagnosis not present

## 2020-03-17 DIAGNOSIS — M899 Disorder of bone, unspecified: Secondary | ICD-10-CM | POA: Diagnosis not present

## 2020-03-17 DIAGNOSIS — C7952 Secondary malignant neoplasm of bone marrow: Secondary | ICD-10-CM | POA: Diagnosis not present

## 2020-03-17 DIAGNOSIS — Z85528 Personal history of other malignant neoplasm of kidney: Secondary | ICD-10-CM | POA: Diagnosis not present

## 2020-03-17 DIAGNOSIS — C7951 Secondary malignant neoplasm of bone: Secondary | ICD-10-CM | POA: Diagnosis not present

## 2020-03-17 MED ORDER — DEXAMETHASONE 4 MG PO TABS: ORAL_TABLET | ORAL | 3 refills | Status: DC

## 2020-03-17 MED ORDER — ACYCLOVIR 400 MG PO TABS: 400.0000 mg | ORAL_TABLET | Freq: Every day | ORAL | 3 refills | Status: DC

## 2020-03-17 NOTE — Progress Notes (Signed)
START ON PATHWAY REGIMEN - Multiple Myeloma and Other Plasma Cell Dyscrasias     A cycle is every 21 days:     Bortezomib      Lenalidomide      Dexamethasone   **Always confirm dose/schedule in your pharmacy ordering system**  Patient Characteristics: Multiple Myeloma, Newly Diagnosed, Transplant Ineligible or Refused, High Risk Disease Classification: Multiple Myeloma R-ISS Staging: III Therapeutic Status: Newly Diagnosed Is Patient Eligible for Transplant<= Transplant Ineligible or Refused Risk Status: High Risk Intent of Therapy: Non-Curative / Palliative Intent, Discussed with Patient

## 2020-03-17 NOTE — Progress Notes (Signed)
Hematology and Oncology Follow Up Visit  Devin [name] [id number] 1940-12-08 79 y.o. 03/17/2020 12:37 PM Devin [name], MDTimberlake, [name], *   Principle Diagnosis: 79 year old man with multiple myeloma diagnosed in November 2021.  He he presented with IgA level 2579, M spike of 2.5 g/dL and bone involvement.  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   Prior Therapy:   He status post right robotic laparoscopic partial nephrectomy on October 03, 2016.  Current therapy:   Palliative radiation therapy to the spine  Under consideration to start systemic therapy.  Interim History: [name] presents today for a follow-up visit.  Since the last visit, he is receiving radiation therapy without any major complications.  He had denies any nausea, vomiting or abdominal pain.  He denies any weakness or pathological fractures.  He still having lower back and pelvic discomfort which has not improved with 5 fractions radiation.  He continues to be on morphine long-acting as well as immediate release for breakthrough.  He does ambulate with the help of walker.    Medications: Updated on review. Current Outpatient Medications  Medication Sig Dispense Refill  . acetaminophen (TYLENOL) 500 MG tablet Take 1,000 mg by mouth daily as needed for mild pain or moderate pain.    Marland Kitchen allopurinol (ZYLOPRIM) 300 MG tablet Take 300 mg by mouth daily.     Marland Kitchen amLODipine (NORVASC) 5 MG tablet Take 5 mg by mouth daily.     . folic acid (FOLVITE) 1 MG tablet Take 1 mg by mouth daily.    . furosemide (LASIX) 20 MG tablet Take 20 mg by mouth 2 (two) times daily.     Marland Kitchen labetalol (NORMODYNE) 300 MG tablet Take 300 mg by mouth 2 (two) times daily.     Marland Kitchen losartan (COZAAR) 100 MG tablet Take 100  mg by mouth daily.    . methotrexate (RHEUMATREX) 2.5 MG tablet Take 25 mg by mouth every Wednesday.     . morphine (MS CONTIN) 15 MG 12 hr tablet Take 1 tablet (15 mg total) by mouth every 12 (twelve) hours. 60 tablet 0  . morphine (MSIR) 15 MG tablet Take 1 tablet (15 mg total) by mouth every 4 (four) hours as needed for severe pain. 60 tablet 0  . ondansetron (ZOFRAN-ODT) 4 MG disintegrating tablet     . predniSONE (STERAPRED UNI-PAK 48 TAB) 5 MG (48) TBPK tablet     . rosuvastatin (CRESTOR) 5 MG tablet 1 tablet    . Vitamin D, Ergocalciferol, (DRISDOL) 1.25 MG (50000 UNIT) CAPS capsule Take 50,000 Units by mouth every 14 (fourteen) days.     No current facility-administered medications for this visit.     Allergies:  Allergies  Allergen Reactions  . Pravastatin Other (See Comments)    Joint pain  . Zestril [Lisinopril] Other (See Comments)    Gout   ( Zestril) gout  . Amlodipine Other (See Comments)  . Oxycodone Other (See Comments)    Causes agitation       Physical Exam: Blood pressure 136/64, pulse (!) 118, temperature 97.7 F (36.5 C), temperature source Tympanic, resp. rate 18, height $RemoveBe'5\' 10"'LOGLZmTaV$  (1.778 m), weight 174 lb 8 oz (79.2 kg), SpO2 99 %.   ECOG:  1   General appearance: Comfortable appearing without any discomfort Head:  Normocephalic without any trauma Oropharynx: Mucous membranes are moist and pink without any thrush or ulcers. Eyes: Pupils are equal and round reactive to light. Lymph nodes: No cervical, supraclavicular, inguinal or axillary lymphadenopathy.   Heart:regular rate and rhythm.  S1 and S2 without leg edema. Lung: Clear without any rhonchi or wheezes.  No dullness to percussion. Abdomin: Soft, nontender, nondistended with good bowel sounds.  No hepatosplenomegaly. Musculoskeletal: No joint deformity or effusion.  Full range of motion noted. Neurological: No deficits noted on motor, sensory and deep tendon reflex exam. Skin: No petechial rash or  dryness.  Appeared moist.      Lab Results: Lab Results  Component Value Date   WBC 7.7 03/02/2020   HGB 12.0 (L) 03/02/2020   HCT 34.7 (L) 03/02/2020   MCV 103.6 (H) 03/02/2020   PLT 193 03/02/2020     Chemistry      Component Value Date/Time   NA 135 02/11/2020 1149   K 4.0 02/11/2020 1149   CL 101 02/11/2020 1149   CO2 24 02/11/2020 1149   BUN 21 02/11/2020 1149   CREATININE 1.73 (H) 02/11/2020 1149      Component Value Date/Time   CALCIUM 9.5 02/11/2020 1149   ALKPHOS 56 02/11/2020 1149   AST 16 02/11/2020 1149   ALT 20 02/11/2020 1149   BILITOT 0.4 02/11/2020 1149            Impression and Plan:  79 year old with:  1.  Multiple myeloma presented with bone involvement and found to have IgA lambda M spike of 2.5 g/dL, IgA level of 2579 in addition to elevated kappa free light chain to 1318.  Cytogenetics indicated a poor prognostic features with (14,16)  translocation.  These findings were discussed today in details and reiterated and the treatment options were discussed.  Systemic therapy is critical to start in the near future given his high risk multiple myeloma.  Velcade-based regimen given his renal failure is preferred at this time.  He received Velcade and dexamethasone weekly with potentially adding Revlimid at later date if his kidney function improves.  Complication associated with his chemotherapy include nausea, vomiting, myelosuppression, neutropenia and hyperglycemia.  He will start Velcade weekly subcutaneous injection with dexamethasone at 20 mg.  Plan to start soon as radiation is completed.   2. Back pain: Related to multiple myeloma.  He is currently receiving radiation therapy and long-acting morphine.  3. Chromophobe kidney cancer: Unrelated to his current presentation at this time.  4.  Antiemetics: Prescription for Zofran is available to him.  5.  VZV prophylaxis: Prescription for acyclovir will also be added to prevent zoster  reactivation.  6. Follow-up: Will be in in the next week or to start treatment.   30  minutes were dedicated to this visit.  The time was spent on reviewing disease status, reviewing pathology results and outlining future plan of care.     Zola Button, MD 12/28/202112:37 PM

## 2020-03-18 ENCOUNTER — Ambulatory Visit
Admission: RE | Admit: 2020-03-18 | Discharge: 2020-03-18 | Disposition: A | Discharge status: Home / Self Care | Payer: Medicare HMO | Source: Ambulatory Visit | Attending: Radiation Oncology | Admitting: Radiation Oncology

## 2020-03-18 DIAGNOSIS — C9000 Multiple myeloma not having achieved remission: Secondary | ICD-10-CM | POA: Diagnosis not present

## 2020-03-18 DIAGNOSIS — C7952 Secondary malignant neoplasm of bone marrow: Secondary | ICD-10-CM | POA: Diagnosis not present

## 2020-03-18 DIAGNOSIS — Z510 Encounter for antineoplastic radiation therapy: Secondary | ICD-10-CM | POA: Diagnosis not present

## 2020-03-18 DIAGNOSIS — C7951 Secondary malignant neoplasm of bone: Secondary | ICD-10-CM | POA: Diagnosis not present

## 2020-03-19 ENCOUNTER — Telehealth: Payer: Self-pay | Admitting: Oncology

## 2020-03-19 ENCOUNTER — Ambulatory Visit: Payer: Medicare HMO

## 2020-03-19 ENCOUNTER — Ambulatory Visit
Admission: RE | Admit: 2020-03-19 | Discharge: 2020-03-19 | Disposition: A | Discharge status: Home / Self Care | Payer: Medicare HMO | Source: Ambulatory Visit | Attending: Radiation Oncology | Admitting: Radiation Oncology

## 2020-03-19 DIAGNOSIS — I10 Essential (primary) hypertension: Secondary | ICD-10-CM | POA: Diagnosis not present

## 2020-03-19 DIAGNOSIS — C9000 Multiple myeloma not having achieved remission: Secondary | ICD-10-CM | POA: Diagnosis not present

## 2020-03-19 DIAGNOSIS — E440 Moderate protein-calorie malnutrition: Secondary | ICD-10-CM | POA: Diagnosis not present

## 2020-03-19 DIAGNOSIS — Z510 Encounter for antineoplastic radiation therapy: Secondary | ICD-10-CM | POA: Diagnosis not present

## 2020-03-19 DIAGNOSIS — C7951 Secondary malignant neoplasm of bone: Secondary | ICD-10-CM | POA: Diagnosis not present

## 2020-03-19 DIAGNOSIS — C7952 Secondary malignant neoplasm of bone marrow: Secondary | ICD-10-CM | POA: Diagnosis not present

## 2020-03-19 NOTE — Telephone Encounter (Signed)
Scheduled per los, patient has been called and voicemail was left. 

## 2020-03-20 ENCOUNTER — Ambulatory Visit: Payer: Medicare HMO

## 2020-03-20 NOTE — Progress Notes (Signed)
.   Pharmacist Chemotherapy Monitoring - Initial Assessment    Anticipated start date: 03/27/20   Regimen:  . Are orders appropriate based on the patient's diagnosis, regimen, and cycle? Yes . Does the plan date match the patient's scheduled date? Yes . Is the sequencing of drugs appropriate? Yes . Are the premedications appropriate for the patient's regimen? Yes . Prior Authorization for treatment is: Approved o If applicable, is the correct biosimilar selected based on the patient's insurance? not applicable  Organ Function and Labs: Marland Kitchen Are dose adjustments needed based on the patient's renal function, hepatic function, or hematologic function? No . Are appropriate labs ordered prior to the start of patient's treatment? Yes . Other organ system assessment, if indicated: N/A . The following baseline labs, if indicated, have been ordered: N/A  Dose Assessment: . Are the drug doses appropriate? Yes . Are the following correct: o Drug concentrations Yes o IV fluid compatible with drug Yes o Administration routes Yes o Timing of therapy Yes . If applicable, does the patient have documented access for treatment and/or plans for port-a-cath placement? not applicable . If applicable, have lifetime cumulative doses been properly documented and assessed? not applicable Lifetime Dose Tracking  No doses have been documented on this patient for the following tracked chemicals: Doxorubicin, Epirubicin, Idarubicin, Daunorubicin, Mitoxantrone, Bleomycin, Oxaliplatin, Carboplatin, Liposomal Doxorubicin  o   Toxicity Monitoring/Prevention: . The patient has the following take home antiemetics prescribed: Ondansetron . The patient has the following take home medications prescribed: VZV prophylaxis . Medication allergies and previous infusion related reactions, if applicable, have been reviewed and addressed. Yes . The patient's current medication list has been assessed for drug-drug interactions with  their chemotherapy regimen. no significant drug-drug interactions were identified on review.  Order Review: . Are the treatment plan orders signed? Yes . Is the patient scheduled to see a provider prior to their treatment? No  I verify that I have reviewed each item in the above checklist and answered each question accordingly.  Stephens Shire 03/20/2020 12:09 PM

## 2020-03-23 ENCOUNTER — Ambulatory Visit: Payer: Medicare HMO

## 2020-03-23 ENCOUNTER — Ambulatory Visit
Admission: RE | Admit: 2020-03-23 | Discharge: 2020-03-23 | Disposition: A | Discharge status: Home / Self Care | Payer: Medicare HMO | Source: Ambulatory Visit | Attending: Radiation Oncology | Admitting: Radiation Oncology

## 2020-03-23 ENCOUNTER — Other Ambulatory Visit: Payer: Self-pay

## 2020-03-23 DIAGNOSIS — C7952 Secondary malignant neoplasm of bone marrow: Secondary | ICD-10-CM | POA: Diagnosis not present

## 2020-03-23 DIAGNOSIS — C7951 Secondary malignant neoplasm of bone: Secondary | ICD-10-CM | POA: Diagnosis not present

## 2020-03-23 DIAGNOSIS — Z510 Encounter for antineoplastic radiation therapy: Secondary | ICD-10-CM | POA: Diagnosis not present

## 2020-03-23 DIAGNOSIS — C9000 Multiple myeloma not having achieved remission: Secondary | ICD-10-CM | POA: Insufficient documentation

## 2020-03-23 LAB — SURGICAL PATHOLOGY

## 2020-03-24 ENCOUNTER — Ambulatory Visit
Admission: RE | Admit: 2020-03-24 | Discharge: 2020-03-24 | Disposition: A | Discharge status: Home / Self Care | Payer: Medicare HMO | Source: Ambulatory Visit | Attending: Radiation Oncology | Admitting: Radiation Oncology

## 2020-03-24 ENCOUNTER — Other Ambulatory Visit: Payer: Self-pay

## 2020-03-24 ENCOUNTER — Encounter: Payer: Self-pay | Admitting: Radiation Oncology

## 2020-03-24 ENCOUNTER — Inpatient Hospital Stay: Payer: Medicare HMO | Attending: Oncology

## 2020-03-24 ENCOUNTER — Other Ambulatory Visit: Payer: Self-pay | Admitting: Oncology

## 2020-03-24 ENCOUNTER — Ambulatory Visit: Payer: Medicare HMO

## 2020-03-24 DIAGNOSIS — Z510 Encounter for antineoplastic radiation therapy: Secondary | ICD-10-CM | POA: Diagnosis not present

## 2020-03-24 DIAGNOSIS — C7952 Secondary malignant neoplasm of bone marrow: Secondary | ICD-10-CM | POA: Diagnosis not present

## 2020-03-24 DIAGNOSIS — Z5111 Encounter for antineoplastic chemotherapy: Secondary | ICD-10-CM | POA: Insufficient documentation

## 2020-03-24 DIAGNOSIS — C7951 Secondary malignant neoplasm of bone: Secondary | ICD-10-CM | POA: Diagnosis not present

## 2020-03-24 DIAGNOSIS — C9000 Multiple myeloma not having achieved remission: Secondary | ICD-10-CM | POA: Diagnosis not present

## 2020-03-25 ENCOUNTER — Ambulatory Visit: Payer: Medicare HMO

## 2020-03-27 ENCOUNTER — Other Ambulatory Visit: Payer: Self-pay

## 2020-03-27 ENCOUNTER — Inpatient Hospital Stay: Payer: Medicare HMO

## 2020-03-27 ENCOUNTER — Telehealth: Payer: Self-pay | Admitting: *Deleted

## 2020-03-27 ENCOUNTER — Ambulatory Visit (HOSPITAL_BASED_OUTPATIENT_CLINIC_OR_DEPARTMENT_OTHER): Payer: Medicare HMO | Admitting: Medical

## 2020-03-27 VITALS — BP 162/89 | HR 85 | Temp 97.7°F | Resp 18

## 2020-03-27 DIAGNOSIS — C9000 Multiple myeloma not having achieved remission: Secondary | ICD-10-CM

## 2020-03-27 DIAGNOSIS — C649 Malignant neoplasm of unspecified kidney, except renal pelvis: Secondary | ICD-10-CM

## 2020-03-27 DIAGNOSIS — Z5111 Encounter for antineoplastic chemotherapy: Secondary | ICD-10-CM | POA: Diagnosis not present

## 2020-03-27 LAB — CBC WITH DIFFERENTIAL (CANCER CENTER ONLY)
Abs Immature Granulocytes: 0.08 10*3/uL — ABNORMAL HIGH (ref 0.00–0.07)
Basophils Absolute: 0 10*3/uL (ref 0.0–0.1)
Basophils Relative: 0 %
Eosinophils Absolute: 0 10*3/uL (ref 0.0–0.5)
Eosinophils Relative: 0 %
HCT: 35.4 % — ABNORMAL LOW (ref 39.0–52.0)
Hemoglobin: 12.3 g/dL — ABNORMAL LOW (ref 13.0–17.0)
Immature Granulocytes: 1 %
Lymphocytes Relative: 2 %
Lymphs Abs: 0.3 10*3/uL — ABNORMAL LOW (ref 0.7–4.0)
MCH: 34.6 pg — ABNORMAL HIGH (ref 26.0–34.0)
MCHC: 34.7 g/dL (ref 30.0–36.0)
MCV: 99.7 fL (ref 80.0–100.0)
Monocytes Absolute: 0.5 10*3/uL (ref 0.1–1.0)
Monocytes Relative: 4 %
Neutro Abs: 11 10*3/uL — ABNORMAL HIGH (ref 1.7–7.7)
Neutrophils Relative %: 93 %
Platelet Count: 119 10*3/uL — ABNORMAL LOW (ref 150–400)
RBC: 3.55 MIL/uL — ABNORMAL LOW (ref 4.22–5.81)
RDW: 13.4 % (ref 11.5–15.5)
WBC Count: 11.8 10*3/uL — ABNORMAL HIGH (ref 4.0–10.5)
nRBC: 0 % (ref 0.0–0.2)

## 2020-03-27 LAB — CMP (CANCER CENTER ONLY)
ALT: 23 U/L (ref 0–44)
AST: 15 U/L (ref 15–41)
Albumin: 3.4 g/dL — ABNORMAL LOW (ref 3.5–5.0)
Alkaline Phosphatase: 75 U/L (ref 38–126)
Anion gap: 10 (ref 5–15)
BUN: 30 mg/dL — ABNORMAL HIGH (ref 8–23)
CO2: 22 mmol/L (ref 22–32)
Calcium: 8.7 mg/dL — ABNORMAL LOW (ref 8.9–10.3)
Chloride: 97 mmol/L — ABNORMAL LOW (ref 98–111)
Creatinine: 1.22 mg/dL (ref 0.61–1.24)
GFR, Estimated: >60 mL/min (ref >60)
Glucose, Bld: 178 mg/dL — ABNORMAL HIGH (ref 70–99)
Potassium: 4.4 mmol/L (ref 3.5–5.1)
Sodium: 129 mmol/L — ABNORMAL LOW (ref 135–145)
Total Bilirubin: 0.9 mg/dL (ref 0.3–1.2)
Total Protein: 6.8 g/dL (ref 6.5–8.1)

## 2020-03-27 MED ORDER — PROCHLORPERAZINE MALEATE 10 MG PO TABS
ORAL_TABLET | ORAL | Status: AC
  Filled 2020-03-27: qty 1

## 2020-03-27 MED ORDER — NON FORMULARY: 1.0000 mg/m2 | Freq: Once | Status: DC

## 2020-03-27 MED ORDER — PROCHLORPERAZINE MALEATE 10 MG PO TABS
10.0000 mg | ORAL_TABLET | Freq: Once | ORAL | Status: AC
  Administered 2020-03-27: 10 mg via ORAL

## 2020-03-27 MED ORDER — SODIUM CHLORIDE 0.9 % IV SOLN
Freq: Once | INTRAVENOUS | Status: AC
  Filled 2020-03-27: qty 250

## 2020-03-27 MED ORDER — BORTEZOMIB CHEMO IV INJECTION 3.5 MG (1MG/ML-GENERIC)
2.0000 mg | Freq: Once | INTRAVENOUS | Status: AC
  Administered 2020-03-27: 2 mg via INTRAVENOUS
  Filled 2020-03-27: qty 2

## 2020-03-27 NOTE — Progress Notes (Signed)
During the 30 minute post-observation period for patient, he ambulated to the bathroom using the IV pole for support. Prior to ambulating, this RN encouraged patient to use wheelchair to the bathroom, patient reported that he could ambulate with the pole. Patient then became unsteady and lost balance and fell, fall was witnessed by multiple RNs. Patient was caught by staff and did not hit his head, there was no LOC. Patient denied feeling dizzy before event, reports his legs were just weak and he lost balance. Patient reported mild chest pain after IV pump hit his chest, chest was observed and no bruising/redness was noted. Patient reported that chest pain subsided within 5 minutes of event. Lucianne Lei, PA, came to patient immediately post fall and vitals were taken within 5 minutes. Patient was stable, vitals WDL and had no complaints upon departure, he was taken to the lobby in a wheelchair.

## 2020-03-27 NOTE — Patient Instructions (Signed)
[facility] Discharge Instructions for Patients Receiving Chemotherapy  Today you received the following chemotherapy agents: bortezomib  To help prevent nausea and vomiting after your treatment, we encourage you to take your nausea medication as prescribed by your physician.    If you develop nausea and vomiting that is not controlled by your nausea medication, call the clinic.   BELOW ARE SYMPTOMS THAT SHOULD BE REPORTED IMMEDIATELY:  *FEVER GREATER THAN 100.5 F  *CHILLS WITH OR WITHOUT FEVER  NAUSEA AND VOMITING THAT IS NOT CONTROLLED WITH YOUR NAUSEA MEDICATION  *UNUSUAL SHORTNESS OF BREATH  *UNUSUAL BRUISING OR BLEEDING  TENDERNESS IN MOUTH AND THROAT WITH OR WITHOUT PRESENCE OF ULCERS  *URINARY PROBLEMS  *BOWEL PROBLEMS  UNUSUAL RASH Items with * indicate a potential emergency and should be followed up as soon as possible.  Feel free to call the clinic should you have any questions or concerns. The clinic phone number is [phone].  Please show the [facility] at check-in to the Emergency Department and triage nurse.  Bortezomib injection What is this medicine? BORTEZOMIB (bor TEZ oh mib) is a medicine that targets proteins in cancer cells and stops the cancer cells from growing. It is used to treat multiple myeloma and mantle-cell lymphoma. This medicine may be used for other purposes; ask your health care provider or pharmacist if you have questions. COMMON BRAND NAME(S): Velcade What should I tell my health care provider before I take this medicine? They need to know if you have any of these conditions: diabetes heart disease irregular heartbeat liver disease on hemodialysis low blood counts, like low white blood cells, platelets, or hemoglobin peripheral neuropathy taking medicine for blood pressure an unusual or allergic reaction to bortezomib, mannitol, boron, other medicines, foods, dyes, or preservatives pregnant or trying to get  pregnant breast-feeding How should I use this medicine? This medicine is for injection into a vein or for injection under the skin. It is given by a health care professional in a hospital or clinic setting. Talk to your pediatrician regarding the use of this medicine in children. Special care may be needed. Overdosage: If you think you have taken too much of this medicine contact a poison control center or emergency room at once. NOTE: This medicine is only for you. Do not share this medicine with others. What if I miss a dose? It is important not to miss your dose. Call your doctor or health care professional if you are unable to keep an appointment. What may interact with this medicine? This medicine may interact with the following medications: ketoconazole rifampin ritonavir [facility]. John's Wort This list may not describe all possible interactions. Give your health care provider a list of all the medicines, herbs, non-prescription drugs, or dietary supplements you use. Also tell them if you smoke, drink alcohol, or use illegal drugs. Some items may interact with your medicine. What should I watch for while using this medicine? You may get drowsy or dizzy. Do not drive, use machinery, or do anything that needs mental alertness until you know how this medicine affects you. Do not stand or sit up quickly, especially if you are an older patient. This reduces the risk of dizzy or fainting spells. In some cases, you may be given additional medicines to help with side effects. Follow all directions for their use. Call your doctor or health care professional for advice if you get a fever, chills or sore throat, or other symptoms of a cold or  flu. Do not treat yourself. This drug decreases your body's ability to fight infections. Try to avoid being around people who are sick. This medicine may increase your risk to bruise or bleed. Call your doctor or health care professional if you notice any unusual  bleeding. You may need blood work done while you are taking this medicine. In some patients, this medicine may cause a serious brain infection that may cause death. If you have any problems seeing, thinking, speaking, walking, or standing, tell your doctor right away. If you cannot reach your doctor, urgently seek other source of medical care. Check with your doctor or health care professional if you get an attack of severe diarrhea, nausea and vomiting, or if you sweat a lot. The loss of too much body fluid can make it dangerous for you to take this medicine. Do not become pregnant while taking this medicine or for at least 7 months after stopping it. Women should inform their doctor if they wish to become pregnant or think they might be pregnant. Men should not father a child while taking this medicine and for at least 4 months after stopping it. There is a potential for serious side effects to an unborn child. Talk to your health care professional or pharmacist for more information. Do not breast-feed an infant while taking this medicine or for 2 months after stopping it. This medicine may interfere with the ability to have a child. You should talk with your doctor or health care professional if you are concerned about your fertility. What side effects may I notice from receiving this medicine? Side effects that you should report to your doctor or health care professional as soon as possible: allergic reactions like skin rash, itching or hives, swelling of the face, lips, or tongue breathing problems changes in hearing changes in vision fast, irregular heartbeat feeling faint or lightheaded, falls pain, tingling, numbness in the hands or feet right upper belly pain seizures swelling of the ankles, feet, hands unusual bleeding or bruising unusually weak or tired vomiting yellowing of the eyes or skin Side effects that usually do not require medical attention (report to your doctor or health  care professional if they continue or are bothersome): changes in emotions or moods constipation diarrhea loss of appetite headache irritation at site where injected nausea This list may not describe all possible side effects. Call your doctor for medical advice about side effects. You may report side effects to FDA at 1-800-FDA-1088. Where should I keep my medicine? This drug is given in a hospital or clinic and will not be stored at home. NOTE: This sheet is a summary. It may not cover all possible information. If you have questions about this medicine, talk to your doctor, pharmacist, or health care provider.  2020 [employer]/Gold Standard (2017-07-17 16:29:31)

## 2020-03-27 NOTE — Telephone Encounter (Signed)
Received call from pt's wife asking if OK to give aspirin 81 mg & tylenol.  Informed that these were ok but not to take extra aspirin. She expressed understanding.

## 2020-03-30 ENCOUNTER — Telehealth: Payer: Self-pay | Admitting: *Deleted

## 2020-03-30 ENCOUNTER — Other Ambulatory Visit: Payer: Self-pay

## 2020-03-30 ENCOUNTER — Emergency Department (HOSPITAL_COMMUNITY): Payer: Medicare HMO

## 2020-03-30 ENCOUNTER — Encounter (HOSPITAL_COMMUNITY): Payer: Self-pay | Admitting: *Deleted

## 2020-03-30 ENCOUNTER — Telehealth: Payer: Self-pay

## 2020-03-30 ENCOUNTER — Inpatient Hospital Stay (HOSPITAL_COMMUNITY)
Admission: EM | Admit: 2020-03-30 | Discharge: 2020-04-03 | DRG: 543 | Disposition: A | Discharge status: Home Health | Payer: Medicare HMO | Attending: Internal Medicine | Admitting: Internal Medicine

## 2020-03-30 DIAGNOSIS — C9000 Multiple myeloma not having achieved remission: Secondary | ICD-10-CM | POA: Diagnosis present

## 2020-03-30 DIAGNOSIS — Z905 Acquired absence of kidney: Secondary | ICD-10-CM

## 2020-03-30 DIAGNOSIS — Z7952 Long term (current) use of systemic steroids: Secondary | ICD-10-CM | POA: Diagnosis not present

## 2020-03-30 DIAGNOSIS — Z888 Allergy status to other drugs, medicaments and biological substances status: Secondary | ICD-10-CM

## 2020-03-30 DIAGNOSIS — M84454A Pathological fracture, pelvis, initial encounter for fracture: Secondary | ICD-10-CM | POA: Diagnosis not present

## 2020-03-30 DIAGNOSIS — Z86711 Personal history of pulmonary embolism: Secondary | ICD-10-CM

## 2020-03-30 DIAGNOSIS — Z79899 Other long term (current) drug therapy: Secondary | ICD-10-CM | POA: Diagnosis not present

## 2020-03-30 DIAGNOSIS — I10 Essential (primary) hypertension: Secondary | ICD-10-CM | POA: Diagnosis not present

## 2020-03-30 DIAGNOSIS — Z9049 Acquired absence of other specified parts of digestive tract: Secondary | ICD-10-CM | POA: Diagnosis not present

## 2020-03-30 DIAGNOSIS — Z20822 Contact with and (suspected) exposure to covid-19: Secondary | ICD-10-CM | POA: Diagnosis not present

## 2020-03-30 DIAGNOSIS — W19XXXA Unspecified fall, initial encounter: Secondary | ICD-10-CM | POA: Diagnosis present

## 2020-03-30 DIAGNOSIS — E871 Hypo-osmolality and hyponatremia: Secondary | ICD-10-CM | POA: Diagnosis not present

## 2020-03-30 DIAGNOSIS — S32058A Other fracture of fifth lumbar vertebra, initial encounter for closed fracture: Secondary | ICD-10-CM

## 2020-03-30 DIAGNOSIS — S32059A Unspecified fracture of fifth lumbar vertebra, initial encounter for closed fracture: Secondary | ICD-10-CM | POA: Diagnosis not present

## 2020-03-30 DIAGNOSIS — N183 Chronic kidney disease, stage 3 unspecified: Secondary | ICD-10-CM | POA: Diagnosis not present

## 2020-03-30 DIAGNOSIS — N400 Enlarged prostate without lower urinary tract symptoms: Secondary | ICD-10-CM | POA: Diagnosis not present

## 2020-03-30 DIAGNOSIS — S3993XA Unspecified injury of pelvis, initial encounter: Secondary | ICD-10-CM | POA: Diagnosis not present

## 2020-03-30 DIAGNOSIS — S3991XA Unspecified injury of abdomen, initial encounter: Secondary | ICD-10-CM | POA: Diagnosis not present

## 2020-03-30 DIAGNOSIS — R262 Difficulty in walking, not elsewhere classified: Secondary | ICD-10-CM

## 2020-03-30 DIAGNOSIS — Z9221 Personal history of antineoplastic chemotherapy: Secondary | ICD-10-CM | POA: Diagnosis not present

## 2020-03-30 DIAGNOSIS — R52 Pain, unspecified: Secondary | ICD-10-CM | POA: Diagnosis not present

## 2020-03-30 DIAGNOSIS — Z923 Personal history of irradiation: Secondary | ICD-10-CM | POA: Diagnosis not present

## 2020-03-30 DIAGNOSIS — Z96651 Presence of right artificial knee joint: Secondary | ICD-10-CM | POA: Diagnosis present

## 2020-03-30 DIAGNOSIS — Z86010 Personal history of colonic polyps: Secondary | ICD-10-CM

## 2020-03-30 DIAGNOSIS — M8448XA Pathological fracture, other site, initial encounter for fracture: Secondary | ICD-10-CM | POA: Diagnosis not present

## 2020-03-30 DIAGNOSIS — Z85528 Personal history of other malignant neoplasm of kidney: Secondary | ICD-10-CM | POA: Diagnosis not present

## 2020-03-30 DIAGNOSIS — Z87891 Personal history of nicotine dependence: Secondary | ICD-10-CM

## 2020-03-30 DIAGNOSIS — Z885 Allergy status to narcotic agent status: Secondary | ICD-10-CM

## 2020-03-30 DIAGNOSIS — M069 Rheumatoid arthritis, unspecified: Secondary | ICD-10-CM | POA: Diagnosis present

## 2020-03-30 DIAGNOSIS — C649 Malignant neoplasm of unspecified kidney, except renal pelvis: Secondary | ICD-10-CM | POA: Diagnosis not present

## 2020-03-30 DIAGNOSIS — E1122 Type 2 diabetes mellitus with diabetic chronic kidney disease: Secondary | ICD-10-CM | POA: Diagnosis not present

## 2020-03-30 DIAGNOSIS — S299XXA Unspecified injury of thorax, initial encounter: Secondary | ICD-10-CM | POA: Diagnosis not present

## 2020-03-30 DIAGNOSIS — M546 Pain in thoracic spine: Secondary | ICD-10-CM | POA: Diagnosis not present

## 2020-03-30 DIAGNOSIS — M545 Low back pain, unspecified: Secondary | ICD-10-CM | POA: Diagnosis not present

## 2020-03-30 DIAGNOSIS — C801 Malignant (primary) neoplasm, unspecified: Secondary | ICD-10-CM | POA: Diagnosis not present

## 2020-03-30 LAB — BASIC METABOLIC PANEL
Anion gap: 9 (ref 5–15)
BUN: 23 mg/dL (ref 8–23)
CO2: 26 mmol/L (ref 22–32)
Calcium: 8.4 mg/dL — ABNORMAL LOW (ref 8.9–10.3)
Chloride: 95 mmol/L — ABNORMAL LOW (ref 98–111)
Creatinine, Ser: 1.08 mg/dL (ref 0.61–1.24)
GFR, Estimated: >60 mL/min (ref >60)
Glucose, Bld: 147 mg/dL — ABNORMAL HIGH (ref 70–99)
Potassium: 4.1 mmol/L (ref 3.5–5.1)
Sodium: 130 mmol/L — ABNORMAL LOW (ref 135–145)

## 2020-03-30 LAB — CBC WITH DIFFERENTIAL/PLATELET
Abs Immature Granulocytes: 0.03 10*3/uL (ref 0.00–0.07)
Basophils Absolute: 0 10*3/uL (ref 0.0–0.1)
Basophils Relative: 0 %
Eosinophils Absolute: 0.1 10*3/uL (ref 0.0–0.5)
Eosinophils Relative: 2 %
HCT: 37.3 % — ABNORMAL LOW (ref 39.0–52.0)
Hemoglobin: 13 g/dL (ref 13.0–17.0)
Immature Granulocytes: 1 %
Lymphocytes Relative: 8 %
Lymphs Abs: 0.5 10*3/uL — ABNORMAL LOW (ref 0.7–4.0)
MCH: 35.1 pg — ABNORMAL HIGH (ref 26.0–34.0)
MCHC: 34.9 g/dL (ref 30.0–36.0)
MCV: 100.8 fL — ABNORMAL HIGH (ref 80.0–100.0)
Monocytes Absolute: 0.5 10*3/uL (ref 0.1–1.0)
Monocytes Relative: 8 %
Neutro Abs: 4.7 10*3/uL (ref 1.7–7.7)
Neutrophils Relative %: 81 %
Platelets: 85 10*3/uL — ABNORMAL LOW (ref 150–400)
RBC: 3.7 MIL/uL — ABNORMAL LOW (ref 4.22–5.81)
RDW: 13.7 % (ref 11.5–15.5)
WBC: 5.8 10*3/uL (ref 4.0–10.5)
nRBC: 0 % (ref 0.0–0.2)

## 2020-03-30 LAB — MULTIPLE MYELOMA PANEL, SERUM
Albumin SerPl Elph-Mcnc: 3.4 g/dL (ref 2.9–4.4)
Albumin/Glob SerPl: 1.2 (ref 0.7–1.7)
Alpha 1: 0.2 g/dL (ref 0.0–0.4)
Alpha2 Glob SerPl Elph-Mcnc: 0.7 g/dL (ref 0.4–1.0)
B-Globulin SerPl Elph-Mcnc: 0.7 g/dL (ref 0.7–1.3)
Gamma Glob SerPl Elph-Mcnc: 1.3 g/dL (ref 0.4–1.8)
Globulin, Total: 2.9 g/dL (ref 2.2–3.9)
IgA: 1114 mg/dL — ABNORMAL HIGH (ref 61–437)
IgG (Immunoglobin G), Serum: 383 mg/dL — ABNORMAL LOW (ref 603–1613)
IgM (Immunoglobulin M), Srm: 9 mg/dL — ABNORMAL LOW (ref 15–143)
M Protein SerPl Elph-Mcnc: 0.7 g/dL — ABNORMAL HIGH
Total Protein ELP: 6.3 g/dL (ref 6.0–8.5)

## 2020-03-30 LAB — KAPPA/LAMBDA LIGHT CHAINS
Kappa free light chain: 7.8 mg/L (ref 3.3–19.4)
Kappa, lambda light chain ratio: 0.03 — ABNORMAL LOW (ref 0.26–1.65)
Lambda free light chains: 237.4 mg/L — ABNORMAL HIGH (ref 5.7–26.3)

## 2020-03-30 MED ORDER — HYDROMORPHONE HCL 1 MG/ML IJ SOLN
1.0000 mg | Freq: Once | INTRAMUSCULAR | Status: AC
Start: 2020-03-30 — End: 2020-03-30
  Administered 2020-03-30: 1 mg via INTRAVENOUS
  Filled 2020-03-30: qty 1

## 2020-03-30 MED ORDER — ONDANSETRON HCL 4 MG/2ML IJ SOLN
4.0000 mg | Freq: Once | INTRAMUSCULAR | Status: AC
  Administered 2020-03-30: 4 mg via INTRAVENOUS
  Filled 2020-03-30: qty 2

## 2020-03-30 NOTE — Telephone Encounter (Signed)
She can monitor for the day, but if he has no improvement she can call 911 to get him up.

## 2020-03-30 NOTE — ED Notes (Signed)
Patient back from x-ray 

## 2020-03-30 NOTE — ED Provider Notes (Signed)
[facility] DEPT Provider Note   CSN: [id number] Arrival date & time: 03/30/20  1714     History Chief Complaint  Patient presents with  . Fall  . Back Pain    Devin [name] is a 80 y.o. male.  Patient with history of multiple myeloma presents the emergency department for evaluation of pain.  Patient reports pain in his ankles and legs that reminds him of gout that he had in the past.  He states that this has caused him difficulty with walking and caused him to fall.  Patient fell and sustained a bruise to his right chest while at the infusion center three days ago. Epic notes and triage note states that patient has been having back pain that he been preventing him from getting out of bed. EMS was called for transport to the hospital. Pt states his wife has been giving him his medications, including medication for pain.  Patient's wife arrived to the hospital and states that he has been having low difficulty sitting up in bed.  Typically he will walk with a cane or walker, and has not been able to get out of bed and ambulate at all.  Recent infusion was the first that he has gotten.  He seems to have tolerated this well without nausea, vomiting, diarrhea.  He has been drinking Ensure at home.        Past Medical History:  Diagnosis Date  . Arthritis   . History of pulmonary embolism 04/2016  . Hypertension   . Renal cell carcinoma [facility])     Patient Active Problem List   Diagnosis Date Noted  . Chronic kidney disease, stage 3 unspecified ([place]) 03/03/2020  . 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/2020  . Essential hypertension 03/03/2020  . Hardening of the aorta (main artery of the heart) ([place]) 03/03/2020  . Iron deficiency anemia 03/03/2020  . Multiple myeloma ([place]) 03/03/2020  . Obesity 03/03/2020  . Personal history of colonic polyps 03/03/2020  . Personal history of pulmonary embolism 03/03/2020  . Prediabetes 03/03/2020  .  Primary gout 03/03/2020  . Pure hypercholesterolemia 03/03/2020  . 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[place]) 03/03/2020  . Thoracic aortic aneurysm without rupture ([place]) 03/03/2020  . Type 2 diabetes mellitus ([place]) 03/03/2020  . Vitamin D deficiency 03/03/2020  . Malignant tumor of kidney ([place]) 03/03/2020  . 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 Renal lesion 05/06/2016  . Delirium 05/05/2016  . Pulmonary embolus ([place]) 05/05/2016  . 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 Osteoarthritis of right knee 04/20/2016  . Rheumatoid arthritis involving right knee ([place]) 04/20/2016  . Inguinal hernia without mention of obstruction or gangrene, unilateral or unspecified, (not specified as recurrent)-left 08/10/2011    Past Surgical History:  Procedure Laterality Date  . APPENDECTOMY    . DENTAL SURGERY  03/2016   all upper teeth pulled and dentures.   Marland Kitchen HERNIA REPAIR  1985&2001  . IR FLUORO GUIDED NEEDLE PLC ASPIRATION/INJECTION LOC  02/05/2020  . right knee replacement   04/2016  . right wrist plate due to fracture     . ROBOT ASSISTED LAPAROSCOPIC NEPHRECTOMY Right 10/03/2016   Procedure: XI ROBOTIC ASSISTED LAPAROSCOPIC  PARTIAL NEPHRECTOMY;  Surgeon: Raynelle Bring, MD;  Location: [facility] ORS;  Service: Urology;  Laterality: Right;       Family History  Problem Relation Age of Onset  . Cancer Mother        bone  . Breast cancer Neg Hx   .  Colon cancer Neg Hx   . Pancreatic cancer Neg Hx   . Prostate cancer Neg Hx     Social History   Tobacco Use  . Smoking status: Former Research scientist (life sciences)  . Smokeless tobacco: Former Systems developer    Quit date: 08/09/1985  Vaping Use  . Vaping Use: Never used  Substance Use Topics  . Alcohol use: Yes    Comment: occ beer   . Drug use: No    Home Medications Prior to Admission medications   Medication Sig Start Date End Date Taking? Authorizing Provider  acetaminophen (TYLENOL) 500 MG tablet Take 1,000 mg by mouth daily as needed for mild pain or moderate  pain.    [provider]  acyclovir (ZOVIRAX) 400 MG tablet Take 1 tablet (400 mg total) by mouth daily. 03/17/20   Wyatt Portela, MD  allopurinol (ZYLOPRIM) 300 MG tablet Take 300 mg by mouth daily.  07/29/11   [provider]  amLODipine (NORVASC) 5 MG tablet Take 5 mg by mouth daily.     [provider]  dexamethasone (DECADRON) 4 MG tablet Take 5 tablets weekly on the day of cancer treatment. 03/17/20   Wyatt Portela, MD  folic acid (FOLVITE) 1 MG tablet Take 1 mg by mouth daily.    [provider]  furosemide (LASIX) 20 MG tablet Take 20 mg by mouth 2 (two) times daily.  07/25/11   [provider]  labetalol (NORMODYNE) 300 MG tablet Take 300 mg by mouth 2 (two) times daily.  07/29/11   [provider]  losartan (COZAAR) 100 MG tablet Take 100 mg by mouth daily.    [provider]  methotrexate (RHEUMATREX) 2.5 MG tablet Take 25 mg by mouth every Wednesday.     [provider]  morphine (MS CONTIN) 15 MG 12 hr tablet Take 1 tablet (15 mg total) by mouth every 12 (twelve) hours. 03/12/20   Kyung Rudd, MD  morphine (MSIR) 15 MG tablet Take 1 tablet (15 mg total) by mouth every 4 (four) hours as needed for severe pain. 03/12/20   Kyung Rudd, MD  ondansetron (ZOFRAN-ODT) 4 MG disintegrating tablet  02/14/20   [provider]  predniSONE (STERAPRED UNI-PAK 48 TAB) 5 MG (48) TBPK tablet  02/18/20   [provider]  rosuvastatin (CRESTOR) 5 MG tablet 1 tablet    [provider]  Vitamin D, Ergocalciferol, (DRISDOL) 1.25 MG (50000 UNIT) CAPS capsule Take 50,000 Units by mouth every 14 (fourteen) days. 12/24/19   [provider]    Allergies    Pravastatin, Zestril [lisinopril], Amlodipine, and Oxycodone  Review of Systems   Review of Systems  Constitutional: Negative for fever.  HENT: Negative for rhinorrhea and sore throat.   Eyes: Negative for redness.  Respiratory: Negative for  cough.   Cardiovascular: Negative for chest pain.  Gastrointestinal: Negative for abdominal pain, diarrhea, nausea and vomiting.  Genitourinary: Negative for dysuria and hematuria.  Musculoskeletal: Positive for arthralgias, back pain and gait problem. Negative for myalgias.  Skin: Negative for rash.  Neurological: Negative for headaches.    Physical Exam Updated Vital Signs BP 117/85 (BP Location: Right Arm)   Pulse 89   Temp 98.3 F (36.8 C) (Oral)   Resp 18   Wt 79.2 kg   SpO2 97%   BMI 25.04 kg/m   Physical Exam Vitals and nursing note reviewed.  Constitutional:      Appearance: He is well-developed and well-nourished.  HENT:  Head: Normocephalic and atraumatic.  Eyes:     General:        Right eye: No discharge.        Left eye: No discharge.     Conjunctiva/sclera: Conjunctivae normal.  Cardiovascular:     Rate and Rhythm: Normal rate and regular rhythm.     Heart sounds: Normal heart sounds.  Pulmonary:     Effort: Pulmonary effort is normal.     Breath sounds: Normal breath sounds.  Abdominal:     Palpations: Abdomen is soft.     Tenderness: There is no abdominal tenderness.  Musculoskeletal:     Cervical back: Normal range of motion and neck supple.     Thoracic back: Tenderness and bony tenderness present.     Lumbar back: Tenderness and bony tenderness present.     Right hip: Normal range of motion.     Left hip: Normal range of motion.     Right upper leg: No tenderness or bony tenderness.     Left upper leg: No tenderness or bony tenderness.     Right knee: Normal range of motion. No tenderness.     Left knee: Normal range of motion. No tenderness.     Right lower leg: No tenderness.     Left lower leg: No tenderness.     Right ankle: Normal range of motion.     Left ankle: Normal range of motion.     Right foot: Normal range of motion. No tenderness.     Left foot: Normal range of motion. No tenderness.  Skin:    General: Skin is warm and  dry.  Neurological:     Mental Status: He is alert.  Psychiatric:        Mood and Affect: Mood and affect normal.     ED Results / Procedures / Treatments   Labs (all labs ordered are listed, but only abnormal results are displayed) Labs Reviewed  CBC WITH DIFFERENTIAL/PLATELET - Abnormal; Notable for the following components:      Result Value   RBC 3.70 (*)    HCT 37.3 (*)    MCV 100.8 (*)    MCH 35.1 (*)    Platelets 85 (*)    Lymphs Abs 0.5 (*)    All other components within normal limits  BASIC METABOLIC PANEL - Abnormal; Notable for the following components:   Sodium 130 (*)    Chloride 95 (*)    Glucose, Bld 147 (*)    Calcium 8.4 (*)    All other components within normal limits  SARS CORONAVIRUS 2 (TAT 6-24 HRS)    EKG None  Radiology DG Thoracic Spine 2 View  Result Date: 03/30/2020 CLINICAL DATA:  Fall at chemo on Friday with thoracic back pain. EXAM: THORACIC SPINE 2 VIEWS COMPARISON:  Chest CT 05/28/2018. FINDINGS: Chronic T4 compression fracture is unchanged from prior chest CT. No evidence of new or compression deformity or acute fracture. Flowing anterior osteophytes throughout the thoracic spine. Mild multilevel disc space narrowing. No paravertebral soft tissue abnormality to suggest fracture. No radiographic evidence of focal lesion. IMPRESSION: 1. No acute fracture or subluxation of the thoracic spine. 2. Chronic T4 compression fracture. Multilevel degenerative disc disease and DISH. Electronically Signed   By: Keith Rake M.D.   On: 03/30/2020 18:55   DG Lumbar Spine Complete  Result Date: 03/30/2020 CLINICAL DATA:  Fall a chemotherapy on Friday with continued back pain. EXAM: LUMBAR SPINE - COMPLETE 4+ VIEW COMPARISON:  PET CT 02/03/2020.  Lumbar MRI 01/08/2020 FINDINGS: Lytic lesion within L5 vertebral body, chronic mild superior endplate flattening appears similar to prior imaging. No evidence of new or acute fracture. The remaining vertebral body  heights are preserved. No obvious new focal lesion by radiograph. Known left iliac lesion is not well seen. IMPRESSION: 1. No evidence of acute fracture of the lumbar spine. 2. Lytic lesion in the L5 vertebral body with slight loss of height, grossly unchanged from recent PET/CT. No visualized new lumbar lesions. Electronically Signed   By: Keith Rake M.D.   On: 03/30/2020 18:53   CT Thoracic Spine Wo Contrast  Result Date: 03/30/2020 CLINICAL DATA:  Fall. Currently on chemotherapy. Renal cell carcinoma. EXAM: CT THORACIC AND LUMBAR SPINE WITHOUT CONTRAST TECHNIQUE: Multidetector CT imaging of the thoracic and lumbar spine was performed without contrast. Multiplanar CT image reconstructions were also generated. COMPARISON:  None. FINDINGS: CT THORACIC SPINE FINDINGS Alignment: Normal Vertebrae: There is ankylosis from T3-T11. No lytic or blastic lesion. No acute fracture. Moderate height loss at T4 appears chronic. Paraspinal and other soft tissues: Calcific aortic atherosclerosis. Disc levels: Spinal canal patency is maintained. CT LUMBAR SPINE FINDINGS Segmentation: Standard Alignment: Normal Vertebrae: Mottled lytic lesions of L5 with vertically oriented fracture through the midportion of the vertebral body. Fracture also involves anterior wall. No retropulsion. Mild height loss. Otherwise, the bones are osteopenic without other focal lesion. Paraspinal and other soft tissues: Calcific atherosclerosis. Disc levels: No spinal canal stenosis. IMPRESSION: 1. Mottled lytic lesions of the L5 vertebral body with vertically oriented pathologic fracture through the midportion of the vertebral body and anterior wall, with mild height loss. No retropulsion or spinal canal stenosis. 2. No acute fracture or static subluxation of the thoracic or lumbar spine. 3. Moderate height loss at T4 appears chronic. 4. T3-T11 ankylosis. Aortic Atherosclerosis (ICD10-I70.0). Electronically Signed   By: Ulyses Jarred M.D.   On:  03/30/2020 23:02   CT Lumbar Spine Wo Contrast  Result Date: 03/30/2020 CLINICAL DATA:  Fall. Currently on chemotherapy. Renal cell carcinoma. EXAM: CT THORACIC AND LUMBAR SPINE WITHOUT CONTRAST TECHNIQUE: Multidetector CT imaging of the thoracic and lumbar spine was performed without contrast. Multiplanar CT image reconstructions were also generated. COMPARISON:  None. FINDINGS: CT THORACIC SPINE FINDINGS Alignment: Normal Vertebrae: There is ankylosis from T3-T11. No lytic or blastic lesion. No acute fracture. Moderate height loss at T4 appears chronic. Paraspinal and other soft tissues: Calcific aortic atherosclerosis. Disc levels: Spinal canal patency is maintained. CT LUMBAR SPINE FINDINGS Segmentation: Standard Alignment: Normal Vertebrae: Mottled lytic lesions of L5 with vertically oriented fracture through the midportion of the vertebral body. Fracture also involves anterior wall. No retropulsion. Mild height loss. Otherwise, the bones are osteopenic without other focal lesion. Paraspinal and other soft tissues: Calcific atherosclerosis. Disc levels: No spinal canal stenosis. IMPRESSION: 1. Mottled lytic lesions of the L5 vertebral body with vertically oriented pathologic fracture through the midportion of the vertebral body and anterior wall, with mild height loss. No retropulsion or spinal canal stenosis. 2. No acute fracture or static subluxation of the thoracic or lumbar spine. 3. Moderate height loss at T4 appears chronic. 4. T3-T11 ankylosis. Aortic Atherosclerosis (ICD10-I70.0). Electronically Signed   By: Ulyses Jarred M.D.   On: 03/30/2020 23:02   CT PELVIS WO CONTRAST  Result Date: 03/30/2020 CLINICAL DATA:  Pelvic trauma. Fall it chemo on Friday with continued pain. Known osseous metastatic disease. EXAM: CT PELVIS WITHOUT CONTRAST TECHNIQUE: Multidetector CT imaging of the pelvis  was performed following the standard protocol without intravenous contrast. COMPARISON:  PET CT 02/03/2020,  lumbar MRI 01/08/2020 FINDINGS: Urinary Tract: Decompressed distal ureters. Mass effect on the urinary bladder from enlarged prostate gland, no definite bladder wall thickening. Bowel: No acute bowel inflammation. Moderate stool in the included colon. There is stool distending the rectum. Vascular/Lymphatic: Dense aorto bi-iliac atherosclerosis. Iliac vessels are tortuous. No enlarged pelvic lymph nodes. 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 Other: Prior right inguinal hernia repair with tacks. There is minimal fat in the left inguinal canal. No pelvic free fluid. Musculoskeletal: Known L5 lytic with pathologic fracture. The small sacral metastatic lesions are not well seen by CT. No evidence of pelvic fracture, detailed assessment limited by osseous demineralization. Left iliac low-density lesion measures fat and is consistent with a benign intraosseous lipoma. Scattered small scerlotic foci were not hypermetabolic on prior PET. 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 There is no confluent soft tissue hematoma. IMPRESSION: 1. No evidence of acute pelvic fracture. 2. Known L5 lytic with pathologic fracture, assessed on concurrent lumbar spine CT. The small sacral metastatic lesions on prior PET are not well seen by CT. 3. 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 Aortic Atherosclerosis (ICD10-I70.0). Electronically Signed   By: Keith Rake M.D.   On: 03/30/2020 22:56    Procedures Procedures (including critical care time)  Medications Ordered in ED Medications  HYDROmorphone (DILAUDID) injection 1 mg (1 mg Intravenous Given 03/30/20 2052)  ondansetron (ZOFRAN) injection 4 mg (4 mg Intravenous Given 03/30/20 2053)    ED Course  I have reviewed the triage vital signs and the nursing notes.  Pertinent labs & imaging results that were available during my care of the  patient were reviewed by me and considered in my medical decision making (see chart for details).  Patient seen and examined.  Initially seen in a hallway bed, by himself.  X-rays of the thoracic and lumbar spine ordered.  Vital signs reviewed and are as follows: BP 130/86 (BP Location: Left Arm)   Pulse 70   Temp 98.3 F (36.8 C) (Oral)   Resp 17   Wt 79.2 kg   SpO2 98%   BMI 25.04 kg/m   Patient's wife later arrived and additional history was obtained.  We discussed the results today.  No fractures involving new vertebrae noted.  Patient discussed with and seen by Dr. Maryan Rued.  CT of the thoracic, lumbar spine and CT of the pelvis ordered.  CTs appear to demonstrate more substantial L5 vertebral fracture.  Will discuss with neurosurgery.  I spoke with Meyran PA-C and reviewed images.  Recommend corset brace.  I have ordered this.  Neurosurgery will be available if needed by admitting team.  I discussed case with Dr. Tonie Griffith. He will see patient and evaluate for admission to hospital.     MDM Rules/Calculators/A&P                          Patient with uncontrolled pain from pathologic lumbar fracture. He is unable to sit up in bed, stand or ambulate due to symptoms.  Pain not controlled in ED despite IV medications.  Plan for admission.    Final Clinical Impression(s) / ED Diagnoses Final diagnoses:  Other closed fracture of fifth lumbar vertebra, initial encounter [facility])    Rx / DC Orders ED Discharge Orders  None       Carlisle Cater, PA-C 03/31/20 Evelina Dun, MD 03/31/20 1106

## 2020-03-30 NOTE — Telephone Encounter (Signed)
-----   Message from Teodoro Spray, RN sent at 03/27/2020  3:58 PM EST ----- Regarding: [facility] first time chemo Dr. Alen Blew patient first time IV velcade on 03/27/20. Patient tolerated treatment well. Thank you

## 2020-03-30 NOTE — Telephone Encounter (Signed)
Called & spoke to pt's wife to see how he did post treatment.  She reports that he has not been able to get out of bed this am.  He was also incontinent of urine this am.  He states that he was just sleeping & didn't know that he wet the bed.  This is new for him.  He is moving his arms & legs although gingerly with legs due to aches in legs & back pain. He is eating & drinking.  She reports bowels have been fine but no BM yest or today.  She has been concerned about his BP being up.  She reports yest. 198/100 & 3 hours later after BP meds 135/90.  BP this am is 151/92.  She mentioned fall in infusion & wondering if he might have another vertebral break.  She asked if she should call 911 to get him up. Encouraged to let pt rest for now & will discuss with Dr Alen Blew.  Message routed.

## 2020-03-30 NOTE — Telephone Encounter (Signed)
Devin Fall, RN had called the patients wife earlier this morning to check on how the patient was doing. At that time, the wife reported to [facility] that the patient had not been able to get out of bed and was concerned about a [name] that the patient had in infusion. The wife is wondering if the patient's [name] in infusion could have possibly resulted in another vertebral break. Myrtle routed the conversation to Dr. Alen Blew.  I advised the patient's wife that Dr. Alen Blew stated to monitor him for the day, but if he had no improvement then she needed to call 911 in order to get him up. The wife was not satisfied with the response that Dr. Alen Blew gave so I asked Dr. Julien Nordmann due to Dr. Alen Blew being out of the office. Dr. Julien Nordmann stated that if the patient was unable to get out of the bed then the wife needed to call 911 in order to get him to the ER to check for a possible break. I notified the patient's wife of [name] recommendation and she verbalized understanding. [name] stated that she would call 911 once we got off of the phone.

## 2020-03-30 NOTE — ED Notes (Signed)
Patient transported to X-ray 

## 2020-03-30 NOTE — ED Triage Notes (Signed)
BIB EMS due to fall at Chemo on Friday with continued back pain. Pt is unable to sit in chair due to the pain. 135/77-87-20-97% RA

## 2020-03-31 ENCOUNTER — Encounter (HOSPITAL_COMMUNITY): Payer: Self-pay | Admitting: Family Medicine

## 2020-03-31 ENCOUNTER — Other Ambulatory Visit: Payer: Self-pay

## 2020-03-31 DIAGNOSIS — R52 Pain, unspecified: Secondary | ICD-10-CM | POA: Diagnosis not present

## 2020-03-31 DIAGNOSIS — C9000 Multiple myeloma not having achieved remission: Secondary | ICD-10-CM | POA: Diagnosis present

## 2020-03-31 DIAGNOSIS — S32059A Unspecified fracture of fifth lumbar vertebra, initial encounter for closed fracture: Secondary | ICD-10-CM | POA: Diagnosis present

## 2020-03-31 DIAGNOSIS — N183 Chronic kidney disease, stage 3 unspecified: Secondary | ICD-10-CM | POA: Diagnosis present

## 2020-03-31 DIAGNOSIS — Z888 Allergy status to other drugs, medicaments and biological substances status: Secondary | ICD-10-CM | POA: Diagnosis not present

## 2020-03-31 DIAGNOSIS — Z923 Personal history of irradiation: Secondary | ICD-10-CM | POA: Diagnosis not present

## 2020-03-31 DIAGNOSIS — Z86010 Personal history of colonic polyps: Secondary | ICD-10-CM | POA: Diagnosis not present

## 2020-03-31 DIAGNOSIS — Z905 Acquired absence of kidney: Secondary | ICD-10-CM | POA: Diagnosis not present

## 2020-03-31 DIAGNOSIS — Z7952 Long term (current) use of systemic steroids: Secondary | ICD-10-CM | POA: Diagnosis not present

## 2020-03-31 DIAGNOSIS — M069 Rheumatoid arthritis, unspecified: Secondary | ICD-10-CM | POA: Diagnosis present

## 2020-03-31 DIAGNOSIS — E1122 Type 2 diabetes mellitus with diabetic chronic kidney disease: Secondary | ICD-10-CM | POA: Diagnosis present

## 2020-03-31 DIAGNOSIS — Z20822 Contact with and (suspected) exposure to covid-19: Secondary | ICD-10-CM | POA: Diagnosis present

## 2020-03-31 DIAGNOSIS — I10 Essential (primary) hypertension: Secondary | ICD-10-CM

## 2020-03-31 DIAGNOSIS — Z86711 Personal history of pulmonary embolism: Secondary | ICD-10-CM | POA: Diagnosis not present

## 2020-03-31 DIAGNOSIS — Z885 Allergy status to narcotic agent status: Secondary | ICD-10-CM | POA: Diagnosis not present

## 2020-03-31 DIAGNOSIS — C649 Malignant neoplasm of unspecified kidney, except renal pelvis: Secondary | ICD-10-CM | POA: Diagnosis present

## 2020-03-31 DIAGNOSIS — E871 Hypo-osmolality and hyponatremia: Secondary | ICD-10-CM | POA: Diagnosis not present

## 2020-03-31 DIAGNOSIS — W19XXXA Unspecified fall, initial encounter: Secondary | ICD-10-CM | POA: Diagnosis present

## 2020-03-31 DIAGNOSIS — Z9049 Acquired absence of other specified parts of digestive tract: Secondary | ICD-10-CM | POA: Diagnosis not present

## 2020-03-31 DIAGNOSIS — Z87891 Personal history of nicotine dependence: Secondary | ICD-10-CM | POA: Diagnosis not present

## 2020-03-31 DIAGNOSIS — M8448XA Pathological fracture, other site, initial encounter for fracture: Secondary | ICD-10-CM | POA: Diagnosis present

## 2020-03-31 DIAGNOSIS — Z85528 Personal history of other malignant neoplasm of kidney: Secondary | ICD-10-CM | POA: Diagnosis not present

## 2020-03-31 DIAGNOSIS — Z96651 Presence of right artificial knee joint: Secondary | ICD-10-CM | POA: Diagnosis present

## 2020-03-31 DIAGNOSIS — R262 Difficulty in walking, not elsewhere classified: Secondary | ICD-10-CM

## 2020-03-31 DIAGNOSIS — Z79899 Other long term (current) drug therapy: Secondary | ICD-10-CM | POA: Diagnosis not present

## 2020-03-31 DIAGNOSIS — Z9221 Personal history of antineoplastic chemotherapy: Secondary | ICD-10-CM | POA: Diagnosis not present

## 2020-03-31 LAB — BASIC METABOLIC PANEL
Anion gap: 11 (ref 5–15)
BUN: 22 mg/dL (ref 8–23)
CO2: 24 mmol/L (ref 22–32)
Calcium: 8.4 mg/dL — ABNORMAL LOW (ref 8.9–10.3)
Chloride: 94 mmol/L — ABNORMAL LOW (ref 98–111)
Creatinine, Ser: 1.2 mg/dL (ref 0.61–1.24)
GFR, Estimated: >60 mL/min (ref >60)
Glucose, Bld: 133 mg/dL — ABNORMAL HIGH (ref 70–99)
Potassium: 4.1 mmol/L (ref 3.5–5.1)
Sodium: 129 mmol/L — ABNORMAL LOW (ref 135–145)

## 2020-03-31 LAB — CBC
HCT: 36.9 % — ABNORMAL LOW (ref 39.0–52.0)
Hemoglobin: 12.6 g/dL — ABNORMAL LOW (ref 13.0–17.0)
MCH: 34.7 pg — ABNORMAL HIGH (ref 26.0–34.0)
MCHC: 34.1 g/dL (ref 30.0–36.0)
MCV: 101.7 fL — ABNORMAL HIGH (ref 80.0–100.0)
Platelets: 82 10*3/uL — ABNORMAL LOW (ref 150–400)
RBC: 3.63 MIL/uL — ABNORMAL LOW (ref 4.22–5.81)
RDW: 13.9 % (ref 11.5–15.5)
WBC: 5.3 10*3/uL (ref 4.0–10.5)
nRBC: 0 % (ref 0.0–0.2)

## 2020-03-31 LAB — SARS CORONAVIRUS 2 (TAT 6-24 HRS): SARS Coronavirus 2: NEGATIVE

## 2020-03-31 MED ORDER — ENOXAPARIN SODIUM 40 MG/0.4ML ~~LOC~~ SOLN
40.0000 mg | SUBCUTANEOUS | Status: DC
  Administered 2020-03-31 – 2020-04-03 (×4): 40 mg via SUBCUTANEOUS
  Filled 2020-03-31 (×4): qty 0.4

## 2020-03-31 MED ORDER — ROSUVASTATIN CALCIUM 10 MG PO TABS
10.0000 mg | ORAL_TABLET | Freq: Every day | ORAL | Status: DC
  Administered 2020-03-31 – 2020-04-03 (×4): 10 mg via ORAL
  Filled 2020-03-31 (×4): qty 1

## 2020-03-31 MED ORDER — HYDROMORPHONE HCL 1 MG/ML IJ SOLN
1.0000 mg | INTRAMUSCULAR | Status: DC | PRN
  Administered 2020-03-31 – 2020-04-02 (×8): 1 mg via INTRAVENOUS
  Filled 2020-03-31 (×8): qty 1

## 2020-03-31 MED ORDER — SENNOSIDES-DOCUSATE SODIUM 8.6-50 MG PO TABS: 1.0000 | ORAL_TABLET | Freq: Every evening | ORAL | Status: DC | PRN

## 2020-03-31 MED ORDER — POLYETHYLENE GLYCOL 3350 17 G PO PACK
17.0000 g | PACK | Freq: Every day | ORAL | Status: DC
  Administered 2020-03-31 – 2020-04-03 (×3): 17 g via ORAL
  Filled 2020-03-31 (×3): qty 1

## 2020-03-31 MED ORDER — ACETAMINOPHEN 650 MG RE SUPP: 650.0000 mg | Freq: Four times a day (QID) | RECTAL | Status: DC | PRN

## 2020-03-31 MED ORDER — MORPHINE SULFATE ER 15 MG PO TBCR
15.0000 mg | EXTENDED_RELEASE_TABLET | Freq: Two times a day (BID) | ORAL | Status: DC
  Administered 2020-03-31 – 2020-04-03 (×7): 15 mg via ORAL
  Filled 2020-03-31 (×7): qty 1

## 2020-03-31 MED ORDER — LACTATED RINGERS IV SOLN: INTRAVENOUS | Status: DC

## 2020-03-31 MED ORDER — LOSARTAN POTASSIUM 50 MG PO TABS
100.0000 mg | ORAL_TABLET | Freq: Every day | ORAL | Status: DC
  Administered 2020-03-31 – 2020-04-03 (×4): 100 mg via ORAL
  Filled 2020-03-31: qty 2
  Filled 2020-03-31: qty 4
  Filled 2020-03-31 (×2): qty 2

## 2020-03-31 MED ORDER — LABETALOL HCL 100 MG PO TABS
300.0000 mg | ORAL_TABLET | Freq: Two times a day (BID) | ORAL | Status: DC
  Administered 2020-03-31 – 2020-04-03 (×7): 300 mg via ORAL
  Filled 2020-03-31 (×2): qty 3
  Filled 2020-03-31: qty 1
  Filled 2020-03-31 (×4): qty 3

## 2020-03-31 MED ORDER — ACETAMINOPHEN 325 MG PO TABS: 650.0000 mg | ORAL_TABLET | Freq: Four times a day (QID) | ORAL | Status: DC | PRN

## 2020-03-31 MED ORDER — AMLODIPINE BESYLATE 5 MG PO TABS
5.0000 mg | ORAL_TABLET | Freq: Every day | ORAL | Status: DC
  Administered 2020-03-31 – 2020-04-01 (×2): 5 mg via ORAL
  Filled 2020-03-31 (×2): qty 1

## 2020-03-31 NOTE — Progress Notes (Signed)
Called in regards to this patient's CT lumbar. Has a history of multiple myeloma and was receiving chemotherapy when he fell. Since then has had severe back pain. CT scan showed known lesions at L5 with a vertical fracture through the anterior body, no retropulsion or kyphosis. Would recommend medicine admit for pain if he cannot get it under control. LSO brace when out of bed

## 2020-03-31 NOTE — Progress Notes (Addendum)
Same day note  Patient seen and examined at bedside.  Patient was admitted to the hospital for pain in the back, feels constipated.  At the time of my evaluation, patient complains of pain in the back  Physical examination reveals elderly male with lumbar brace lying flat in bed  Laboratory data and imaging was reviewed  Assessment and Plan.  Fracture of the L5 vertebra with intractable pain.Marland Kitchen LSO brace as per neurosurgery recommendation.   Continue adequate pain control ,PT OT.  Might need rehabilitation.  Continue Dilaudid, morphine.  On Senokot at bedtime.  We will add MiraLAX as well for the patient.    Essential hypertension On losartan, Norvasc and labetalol.  Monitor blood pressure.    Multiple myeloma  Followed by oncology as outpatient.  Has been receiving chemotherapy as outpatient and has received radiation as well as the  Ambulatory dysfunction Check PT OT  Spoke with the patient's spouse at bedside.  DVT prophylaxis.  Lovenox   No Charge  Signed,  Delila Pereyra, MD [facility]

## 2020-03-31 NOTE — ED Notes (Signed)
Contacted Ortho, they stated someone will come in the morning to apply the lumbar corsett since the patient is to be admitted.

## 2020-03-31 NOTE — H&P (Signed)
History and Physical    Devin [name]:[id number] DOB: 1941/02/03 DOA: 03/30/2020  PCP: [name], MD   Patient coming from:    home  Chief Complaint:   I cannot walk due to pain in legs and back  HPI: Devin [name] is a 80 y.o. male with medical history significant for multiple myeloma, HTN, HLD, hx of PE, RA who presents by EMS for evaluation of pain and unable to walk.  He is being treated by oncology for multiple myeloma. Patient reports pain in his ankles and legs that reminds him of gout that he had in the past. He states it is worse in his right leg than left leg. He has not had redness or warmth of legs or joints.  He states that this has caused him difficulty with walking and caused him to fall. He states pain is masturbated by trying to stand or walk. Patient fell and sustained a bruise to his right chest while at the infusion center three days ago. He reports he has been having back pain that he been preventing him from getting out of bed and even has a hard time sitting up due to pain. EMS was called for transport to the hospital. Pt states his wife has been giving him his medications, including medication for pain. Typically he will walk with a cane or walker, and has not been able to get out of bed and ambulate at all.  Recent infusion was the first that he has gotten. He seems to have tolerated this well without nausea, vomiting, diarrhea.  He has been drinking Ensure at home.  ED Course:  He has a closed L5 vertebral fracture. Neurosurgery called by ER and recommended a LSO brace to be placed on patient when he gets out of bed. He is not able to stand or walk in the ER.  Hospitalist service been asked to admit for further management  Review of Systems:  General: Denies fever, chills, weight loss, night sweats.  Denies dizziness.  Denies change in appetite HENT: Denies head trauma, headache, denies change in hearing, tinnitus.  Denies nasal congestion or bleeding.  Denies  sore throat, sores in mouth.  Denies difficulty swallowing Eyes: Denies blurry vision, pain in eye, drainage.  Denies discoloration of eyes. Neck: Denies pain.  Denies swelling.  Denies pain with movement. Cardiovascular: Denies chest pain, palpitations.  Denies edema.  Denies orthopnea Respiratory: Denies shortness of breath, cough.  Denies wheezing.  Denies sputum production Gastrointestinal: Denies abdominal pain, swelling.  Denies nausea, vomiting, diarrhea.  Denies melena.  Denies hematemesis. Musculoskeletal: Has back and leg pain. Has difficulty standing or walking.Denies deformity or swelling.  Denies arthralgias or myalgias. Genitourinary: Denies pelvic pain.  Denies urinary frequency or hesitancy.  Denies dysuria.  Skin: Denies rash.  Denies petechiae, purpura, ecchymosis. Neurological: Denies syncope.  Denies seizure activity.  Denies paresthesia.  Denies slurred speech, drooping face.  Denies visual change. Psychiatric: Denies depression, anxiety. Denies hallucinations.  Past Medical History:  Diagnosis Date  . Arthritis   . History of pulmonary embolism 04/2016  . Hypertension   . Renal cell carcinoma [facility])     Past Surgical History:  Procedure Laterality Date  . APPENDECTOMY    . DENTAL SURGERY  03/2016   all upper teeth pulled and dentures.   Marland Kitchen HERNIA REPAIR  1985&2001  . IR FLUORO GUIDED NEEDLE PLC ASPIRATION/INJECTION LOC  02/05/2020  . right knee replacement   04/2016  . right wrist plate  due to fracture     . ROBOT ASSISTED LAPAROSCOPIC NEPHRECTOMY Right 10/03/2016   Procedure: XI ROBOTIC ASSISTED LAPAROSCOPIC  PARTIAL NEPHRECTOMY;  Surgeon: Raynelle Bring, MD;  Location: [facility] ORS;  Service: Urology;  Laterality: Right;    Social History  reports that he has quit smoking. He quit smokeless tobacco use about 34 years ago. He reports current alcohol use. He reports that he does not use drugs.  Allergies  Allergen Reactions  . Pravastatin Other (See Comments)     Joint pain  . Zestril [Lisinopril] Other (See Comments)    Gout   ( Zestril) gout  . Amlodipine Other (See Comments)  . Oxycodone Other (See Comments)    Causes agitation     Family History  Problem Relation Age of Onset  . Cancer Mother        bone  . Breast cancer Neg Hx   . Colon cancer Neg Hx   . Pancreatic cancer Neg Hx   . Prostate cancer Neg Hx      Prior to Admission medications   Medication Sig Start Date End Date Taking? Authorizing Provider  acetaminophen (TYLENOL) 500 MG tablet Take 1,000 mg by mouth daily as needed for mild pain or moderate pain.    [provider]  acyclovir (ZOVIRAX) 400 MG tablet Take 1 tablet (400 mg total) by mouth daily. 03/17/20   Wyatt Portela, MD  allopurinol (ZYLOPRIM) 300 MG tablet Take 300 mg by mouth daily.  07/29/11   [provider]  amLODipine (NORVASC) 5 MG tablet Take 5 mg by mouth daily.     [provider]  dexamethasone (DECADRON) 4 MG tablet Take 5 tablets weekly on the day of cancer treatment. 03/17/20   Wyatt Portela, MD  folic acid (FOLVITE) 1 MG tablet Take 1 mg by mouth daily.    [provider]  furosemide (LASIX) 20 MG tablet Take 20 mg by mouth 2 (two) times daily.  07/25/11   [provider]  labetalol (NORMODYNE) 300 MG tablet Take 300 mg by mouth 2 (two) times daily.  07/29/11   [provider]  losartan (COZAAR) 100 MG tablet Take 100 mg by mouth daily.    [provider]  methotrexate (RHEUMATREX) 2.5 MG tablet Take 25 mg by mouth every Wednesday.     [provider]  morphine (MS CONTIN) 15 MG 12 hr tablet Take 1 tablet (15 mg total) by mouth every 12 (twelve) hours. 03/12/20   Kyung Rudd, MD  morphine (MSIR) 15 MG tablet Take 1 tablet (15 mg total) by mouth every 4 (four) hours as needed for severe pain. 03/12/20   Kyung Rudd, MD  ondansetron (ZOFRAN-ODT) 4 MG disintegrating tablet  02/14/20   [provider]  predniSONE (STERAPRED  UNI-PAK 48 TAB) 5 MG (48) TBPK tablet  02/18/20   [provider]  rosuvastatin (CRESTOR) 5 MG tablet 1 tablet    [provider]  Vitamin D, Ergocalciferol, (DRISDOL) 1.25 MG (50000 UNIT) CAPS capsule Take 50,000 Units by mouth every 14 (fourteen) days. 12/24/19   [provider]    Physical Exam: Vitals:   03/30/20 1733 03/30/20 1742 03/30/20 1959 03/30/20 2242  BP:  117/85 (!) 89/60 130/86  Pulse:  89 65 70  Resp:  $Remo'18 14 17  'ZYsOY$ Temp:  98.3 F (36.8 C)    TempSrc:  Oral    SpO2:  97% 96% 98%  Weight: 79.2 kg       Constitutional:  NAD, calm, comfortable Vitals:   03/30/20 1733 03/30/20 1742 03/30/20 1959 03/30/20 2242  BP:  117/85 (!) 89/60 130/86  Pulse:  89 65 70  Resp:  $Remo'18 14 17  'wtqDw$ Temp:  98.3 F (36.8 C)    TempSrc:  Oral    SpO2:  97% 96% 98%  Weight: 79.2 kg      General: WDWN, Alert and oriented x3.  Eyes: EOMI, PERRL, conjunctivae normal.  Sclera nonicteric HENT:  [place]/AT, external ears normal.  Nares patent without epistasis.  Mucous membranes are moist. Neck: Soft, normal range of motion, supple, no masses, Trachea midline Respiratory: clear to auscultation bilaterally, no wheezing, no crackles. Normal respiratory effort. No accessory muscle use.  Cardiovascular: Regular rate and rhythm, no murmurs / rubs / gallops. No extremity edema. 1+ pedal pulses bilaterally  Abdomen: Soft, no tenderness, nondistended, no rebound or guarding.  No masses palpated. Bowel sounds normoactive Musculoskeletal:  Limited range of motion of legs due to pain. FROM of upper extremities.  no cyanosis. No joint deformity upper and lower extremities.  Normal muscle tone.  Skin: Warm, dry, intact no rashes, lesions, ulcers. No induration Neurologic: Normal speech. Sensation intact, patella DTR +1 bilaterally. Strength 5/5 in all extremities.   Psychiatric: Normal judgment and insight.  Normal mood.    Labs on Admission: I have personally reviewed following labs and  imaging studies  CBC: Recent Labs  Lab 03/27/20 1358 03/30/20 1959  WBC 11.8* 5.8  NEUTROABS 11.0* 4.7  HGB 12.3* 13.0  HCT 35.4* 37.3*  MCV 99.7 100.8*  PLT 119* 85*    Basic Metabolic Panel: Recent Labs  Lab 03/27/20 1358 03/30/20 1959  NA 129* 130*  K 4.4 4.1  CL 97* 95*  CO2 22 26  GLUCOSE 178* 147*  BUN 30* 23  CREATININE 1.22 1.08  CALCIUM 8.7* 8.4*    GFR: Estimated Creatinine Clearance: 58.2 mL/min (by C-G formula based on SCr of 1.08 mg/dL).  Liver Function Tests: Recent Labs  Lab 03/27/20 1358  AST 15  ALT 23  ALKPHOS 75  BILITOT 0.9  PROT 6.8  ALBUMIN 3.4*    Urine analysis: No results found for: COLORURINE, APPEARANCEUR, LABSPEC, PHURINE, GLUCOSEU, HGBUR, BILIRUBINUR, KETONESUR, PROTEINUR, UROBILINOGEN, NITRITE, LEUKOCYTESUR  Radiological Exams on Admission: DG Thoracic Spine 2 View  Result Date: 03/30/2020 CLINICAL DATA:  Fall at chemo on Friday with thoracic back pain. EXAM: THORACIC SPINE 2 VIEWS COMPARISON:  Chest CT 05/28/2018. FINDINGS: Chronic T4 compression fracture is unchanged from prior chest CT. No evidence of new or compression deformity or acute fracture. Flowing anterior osteophytes throughout the thoracic spine. Mild multilevel disc space narrowing. No paravertebral soft tissue abnormality to suggest fracture. No radiographic evidence of focal lesion. IMPRESSION: 1. No acute fracture or subluxation of the thoracic spine. 2. Chronic T4 compression fracture. Multilevel degenerative disc disease and DISH. Electronically Signed   By: Keith Rake M.D.   On: 03/30/2020 18:55   DG Lumbar Spine Complete  Result Date: 03/30/2020 CLINICAL DATA:  Fall a chemotherapy on Friday with continued back pain. EXAM: LUMBAR SPINE - COMPLETE 4+ VIEW COMPARISON:  PET CT 02/03/2020.  Lumbar MRI 01/08/2020 FINDINGS: Lytic lesion within L5 vertebral body, chronic mild superior endplate flattening appears similar to prior imaging. No evidence of new or  acute fracture. The remaining vertebral body heights are preserved. No obvious new focal lesion by radiograph. Known left iliac lesion is not well seen. IMPRESSION: 1. No evidence of acute fracture of the lumbar spine. 2.  Lytic lesion in the L5 vertebral body with slight loss of height, grossly unchanged from recent PET/CT. No visualized new lumbar lesions. Electronically Signed   By: Keith Rake M.D.   On: 03/30/2020 18:53   CT Thoracic Spine Wo Contrast  Result Date: 03/30/2020 CLINICAL DATA:  Fall. Currently on chemotherapy. Renal cell carcinoma. EXAM: CT THORACIC AND LUMBAR SPINE WITHOUT CONTRAST TECHNIQUE: Multidetector CT imaging of the thoracic and lumbar spine was performed without contrast. Multiplanar CT image reconstructions were also generated. COMPARISON:  None. FINDINGS: CT THORACIC SPINE FINDINGS Alignment: Normal Vertebrae: There is ankylosis from T3-T11. No lytic or blastic lesion. No acute fracture. Moderate height loss at T4 appears chronic. Paraspinal and other soft tissues: Calcific aortic atherosclerosis. Disc levels: Spinal canal patency is maintained. CT LUMBAR SPINE FINDINGS Segmentation: Standard Alignment: Normal Vertebrae: Mottled lytic lesions of L5 with vertically oriented fracture through the midportion of the vertebral body. Fracture also involves anterior wall. No retropulsion. Mild height loss. Otherwise, the bones are osteopenic without other focal lesion. Paraspinal and other soft tissues: Calcific atherosclerosis. Disc levels: No spinal canal stenosis. IMPRESSION: 1. Mottled lytic lesions of the L5 vertebral body with vertically oriented pathologic fracture through the midportion of the vertebral body and anterior wall, with mild height loss. No retropulsion or spinal canal stenosis. 2. No acute fracture or static subluxation of the thoracic or lumbar spine. 3. Moderate height loss at T4 appears chronic. 4. T3-T11 ankylosis. Aortic Atherosclerosis (ICD10-I70.0).  Electronically Signed   By: Ulyses Jarred M.D.   On: 03/30/2020 23:02   CT Lumbar Spine Wo Contrast  Result Date: 03/30/2020 CLINICAL DATA:  Fall. Currently on chemotherapy. Renal cell carcinoma. EXAM: CT THORACIC AND LUMBAR SPINE WITHOUT CONTRAST TECHNIQUE: Multidetector CT imaging of the thoracic and lumbar spine was performed without contrast. Multiplanar CT image reconstructions were also generated. COMPARISON:  None. FINDINGS: CT THORACIC SPINE FINDINGS Alignment: Normal Vertebrae: There is ankylosis from T3-T11. No lytic or blastic lesion. No acute fracture. Moderate height loss at T4 appears chronic. Paraspinal and other soft tissues: Calcific aortic atherosclerosis. Disc levels: Spinal canal patency is maintained. CT LUMBAR SPINE FINDINGS Segmentation: Standard Alignment: Normal Vertebrae: Mottled lytic lesions of L5 with vertically oriented fracture through the midportion of the vertebral body. Fracture also involves anterior wall. No retropulsion. Mild height loss. Otherwise, the bones are osteopenic without other focal lesion. Paraspinal and other soft tissues: Calcific atherosclerosis. Disc levels: No spinal canal stenosis. IMPRESSION: 1. Mottled lytic lesions of the L5 vertebral body with vertically oriented pathologic fracture through the midportion of the vertebral body and anterior wall, with mild height loss. No retropulsion or spinal canal stenosis. 2. No acute fracture or static subluxation of the thoracic or lumbar spine. 3. Moderate height loss at T4 appears chronic. 4. T3-T11 ankylosis. Aortic Atherosclerosis (ICD10-I70.0). Electronically Signed   By: Ulyses Jarred M.D.   On: 03/30/2020 23:02   CT PELVIS WO CONTRAST  Result Date: 03/30/2020 CLINICAL DATA:  Pelvic trauma. Fall it chemo on Friday with continued pain. Known osseous metastatic disease. EXAM: CT PELVIS WITHOUT CONTRAST TECHNIQUE: Multidetector CT imaging of the pelvis was performed following the standard protocol without  intravenous contrast. COMPARISON:  PET CT 02/03/2020, lumbar MRI 01/08/2020 FINDINGS: Urinary Tract: Decompressed distal ureters. Mass effect on the urinary bladder from enlarged prostate gland, no definite bladder wall thickening. Bowel: No acute bowel inflammation. Moderate stool in the included colon. There is stool distending the rectum. Vascular/Lymphatic: Dense aorto bi-iliac atherosclerosis. Iliac vessels are tortuous. No  enlarged pelvic lymph nodes. 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 Other: Prior right inguinal hernia repair with tacks. There is minimal fat in the left inguinal canal. No pelvic free fluid. Musculoskeletal: Known L5 lytic with pathologic fracture. The small sacral metastatic lesions are not well seen by CT. No evidence of pelvic fracture, detailed assessment limited by osseous demineralization. Left iliac low-density lesion measures fat and is consistent with a benign intraosseous lipoma. Scattered small scerlotic foci were not hypermetabolic on prior PET. 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 There is no confluent soft tissue hematoma. IMPRESSION: 1. No evidence of acute pelvic fracture. 2. Known L5 lytic with pathologic fracture, assessed on concurrent lumbar spine CT. The small sacral metastatic lesions on prior PET are not well seen by CT. 3. 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 Aortic Atherosclerosis (ICD10-I70.0). Electronically Signed   By: Keith Rake M.D.   On: 03/30/2020 22:56    Assessment/Plan Principal Problem:   Closed Fracture of L5 vertebra  Mr. Cullers is admitted to med/surg floor. LSO brace placed to be placed in ER per neurosurgery recommendation.  Pain control provided.  Patient continued on his home dose of morphine MS twice a day and will be given Dilaudid as needed for severe breakthrough pain Consult PT and OT.  If  home health or rehab services are identified will have discharge planning work with patient  Active Problems:   Intractable pain Pain control provided as above    Essential hypertension Continue home medications of losartan, Norvasc and labetalol.  Monitor blood pressure    Multiple myeloma  Followed by oncology    Impaired ambulation Secondary to pain from vertebral fracture.  PT and OT consulted for evaluation in am.      DVT prophylaxis: Lovenox for DVT prophylaxis Code Status:   Full code Family Communication:  Diagnosis and plan discussed with patient.  Patient verbalized understanding agrees with plan.  Further recommendations to follow as clinically indicated Disposition Plan:   Patient is from:  Home  Anticipated DC to:  To be determined if patient will need inpatient rehab versus we will go      home with home health  Anticipated DC date:  Anticipate more than 2 midnight stay in the hospital    Consults:    Neurosurgery consulted by ER provider Admission status:  Inpatient   Yevonne Aline King Pinzon MD [facility]  How to contact the [facility] Attending or Consulting provider [place] or covering provider during after hours 7P -7A, for this patient?   1. Check the care team in [facility] and look for a) attending/consulting [facility] provider listed and b) the [facility] team listed 2. Log into [email or web address] and use [place]'s universal password to access. If you do not have the password, please contact the hospital operator. 3. Locate the [facility] provider you are looking for under [facility] and page to a number that you can be directly reached. 4. If you still have difficulty reaching the provider, please page the [facility] (Director on Call) for the Hospitalists listed on amion for assistance.  03/31/2020, 12:49 AM

## 2020-03-31 NOTE — Evaluation (Addendum)
Physical Therapy Evaluation Patient Details Name: Devin [name] MRN: [id number] DOB: 1941/03/16 Today's Date: 03/31/2020   History of Present Illness  Devin [name] is Devin 80 y.o. male with medical history significant for multiple myeloma, HTN, HLD, hx of PE, RA who presents by EMS for evaluation of pain and unable to walk.  He is being treated by oncology for multiple myeloma.CT scan showed known lesions at L5 with Devin vertical fracture through the anterior body, no retropulsion    Neurosurgery called by ER and recommended Devin LSO brace to be placed on patient when he gets out of bed  Clinical Impression  Patient   Was premedicated for pain prior to mobility. Patient required extensive assistance for rolling to don corset while supine, extra time and effort to get to Devin side sitting position , then to full upright. Patient reporting severe pain in lower back, radiating down into hips and back of thighs. Patient tolerated sitting upright for < 1 minute. Maximum assistance to return Patient to  Supine. Patient moves right leg freely in bed, less so with LLE.  Pt admitted with above diagnosis Pt currently with functional limitations due to the deficits listed below (see PT Problem List). Pt will benefit from skilled PT to increase their independence and safety with mobility to allow discharge to the venue listed below.       Follow Up Recommendations Home health PT;SNF    Equipment Recommendations  None recommended by PT    Recommendations for Other Services       Precautions / Restrictions Precautions Precautions: Fall Required Braces or Orthoses: Spinal Brace, didinstruct in log rolling and move from sidelying. Spinal Brace: Lumbar corset (when OOB) Restrictions Weight Bearing Restrictions: No      Mobility  Bed Mobility Overal bed mobility: Needs Assistance Bed Mobility: Rolling;Sidelying to Sit;Sit to Sidelying Rolling: Mod assist Sidelying to sit: Max assist     Sit to sidelying: Max  assist General bed mobility comments: multimodal cues for back precautions, log rolling. Patient able to flex his legs  to prepare to roll to each side for corset to be placed in bed.Max assist to move to partial prop side sitting. Took several minutes to tolerate moving to sitting upright with max UE support by therapist and UE's. Patient only tolerated for ~ 26mn of sitting, then max assistance for legs back onto bed.    Transfers                 General transfer comment: unable to tolerate.  Ambulation/Gait                Stairs            Wheelchair Mobility    Modified Rankin (Stroke Patients Only)       Balance Overall balance assessment: History of Falls;Needs assistance Sitting-balance support: Bilateral upper extremity supported;Feet supported Sitting balance-Leahy Scale: Poor Sitting balance - Comments: required external support, pain limiting                                     Pertinent Vitals/Pain Pain Assessment: Faces Faces Pain Scale: Hurts whole lot Pain Location: lower back, both hips , down to thighs Pain Descriptors / Indicators: Aching;Grimacing;Moaning;Discomfort;Radiating Pain Intervention(s): Premedicated before session;Monitored during session;Limited activity within patient's tolerance;Repositioned    Home Living Family/patient expects to be discharged to:: Private residence Living Arrangements: Spouse/significant other Available Help at  Discharge: Family Type of Home: House Home Access: Stairs to enter   Technical brewer of Steps: 1 Home Layout: One level Home Equipment: Shower seat - built in;Walker - 2 wheels      Prior Function Level of Independence: Independent               Hand Dominance   Dominant Hand: Right    Extremity/Trunk Assessment   Upper Extremity Assessment Upper Extremity Assessment: Defer to OT evaluation    Lower Extremity Assessment Lower Extremity Assessment:  Generalized weakness    Cervical / Trunk Assessment Cervical / Trunk Assessment: Other exceptions Cervical / Trunk Exceptions: difficult to  assess due to pain  Communication   Communication: No difficulties  Cognition Arousal/Alertness: Awake/alert Behavior During Therapy: WFL for tasks assessed/performed Overall Cognitive Status: Within Functional Limits for tasks assessed                                        General Comments      Exercises     Assessment/Plan    PT Assessment Patient needs continued PT services  PT Problem List Decreased strength;Decreased mobility;Decreased knowledge of precautions;Decreased activity tolerance;Decreased balance;Decreased knowledge of use of DME;Pain       PT Treatment Interventions DME instruction;Therapeutic activities;Gait training;Therapeutic exercise;Patient/family education;Functional mobility training    PT Goals (Current goals can be found in the Care Plan section)  Acute Rehab PT Goals Patient Stated Goal: wants to walk PT Goal Formulation: With patient/family Time For Goal Achievement: 04/14/20 Potential to Achieve Goals: Fair    Frequency Min 3X/week   Barriers to discharge        Co-evaluation               AM-PAC PT "6 Clicks" Mobility  Outcome Measure Help needed turning from your back to your side while in Devin flat bed without using bedrails?: Devin Lot Help needed moving from lying on your back to sitting on the side of Devin flat bed without using bedrails?: Devin Lot Help needed moving to and from Devin bed to Devin chair (including Devin wheelchair)?: Total Help needed standing up from Devin chair using your arms (e.g., wheelchair or bedside chair)?: Total Help needed to walk in hospital room?: Total Help needed climbing 3-5 steps with Devin railing? : Total 6 Click Score: 8    End of Session   Activity Tolerance: Patient limited by pain Patient left: in bed;with call bell/phone within reach;with family/visitor  present Nurse Communication: Mobility status PT Visit Diagnosis: Unsteadiness on feet (R26.81);Muscle weakness (generalized) (M62.81)    Time: 1230-1301 PT Time Calculation (min) (ACUTE ONLY): 31 min   Charges:   PT Evaluation $PT Eval Moderate Complexity: 1 Mod PT Treatments $Therapeutic Activity: 8-22 mins         Devin [name] PT Acute Rehabilitation Services Pager [phone] Office [phone]   Claretha Cooper 03/31/2020, 2:42 PM

## 2020-03-31 NOTE — Progress Notes (Signed)
Orthopedic Tech Progress Note Patient Details:  [name] 09-Oct-1940 [id number]  Patient ID: Devin [name], male   DOB: [birth year], 80 y.o.   MRN: [id number]   [place] Pink 03/31/2020, 6:35 AMCalled [facility] for Lumbar corset.

## 2020-03-31 NOTE — ED Notes (Signed)
Devin [name], wife, [phone], would like an update.

## 2020-04-01 ENCOUNTER — Encounter: Payer: Self-pay | Admitting: Oncology

## 2020-04-01 MED ORDER — ALLOPURINOL 300 MG PO TABS
300.0000 mg | ORAL_TABLET | Freq: Every day | ORAL | Status: DC
Start: 2020-04-01 — End: 2020-04-03
  Administered 2020-04-01 – 2020-04-03 (×3): 300 mg via ORAL
  Filled 2020-04-01 (×3): qty 1

## 2020-04-01 MED ORDER — MORPHINE SULFATE 15 MG PO TABS
15.0000 mg | ORAL_TABLET | ORAL | Status: DC | PRN
  Administered 2020-04-02 – 2020-04-03 (×3): 15 mg via ORAL
  Filled 2020-04-01 (×3): qty 1

## 2020-04-01 MED ORDER — LIDOCAINE 5 % EX PTCH
1.0000 | MEDICATED_PATCH | CUTANEOUS | Status: DC
  Administered 2020-04-01 – 2020-04-02 (×2): 1 via TRANSDERMAL
  Filled 2020-04-01 (×3): qty 1

## 2020-04-01 MED ORDER — AMLODIPINE BESYLATE 10 MG PO TABS: 10.0000 mg | ORAL_TABLET | Freq: Every day | ORAL | Status: DC

## 2020-04-01 MED ORDER — ASPIRIN EC 81 MG PO TBEC
81.0000 mg | DELAYED_RELEASE_TABLET | Freq: Every day | ORAL | Status: DC
  Administered 2020-04-01 – 2020-04-03 (×3): 81 mg via ORAL
  Filled 2020-04-01 (×3): qty 1

## 2020-04-01 MED ORDER — AMLODIPINE BESYLATE 10 MG PO TABS
10.0000 mg | ORAL_TABLET | Freq: Every day | ORAL | Status: DC
  Administered 2020-04-02 – 2020-04-03 (×2): 10 mg via ORAL
  Filled 2020-04-01 (×2): qty 1

## 2020-04-01 MED ORDER — ACYCLOVIR 400 MG PO TABS
400.0000 mg | ORAL_TABLET | Freq: Every day | ORAL | Status: DC
  Administered 2020-04-01 – 2020-04-03 (×3): 400 mg via ORAL
  Filled 2020-04-01 (×3): qty 1

## 2020-04-01 MED ORDER — OXYCODONE-ACETAMINOPHEN 5-325 MG PO TABS: 1.0000 | ORAL_TABLET | Freq: Four times a day (QID) | ORAL | Status: DC | PRN

## 2020-04-01 MED ORDER — ACETAMINOPHEN 500 MG PO TABS: 1000.0000 mg | ORAL_TABLET | Freq: Every day | ORAL | Status: DC | PRN

## 2020-04-01 MED ORDER — FOLIC ACID 1 MG PO TABS
1.0000 mg | ORAL_TABLET | Freq: Every day | ORAL | Status: DC
  Administered 2020-04-01 – 2020-04-03 (×3): 1 mg via ORAL
  Filled 2020-04-01 (×3): qty 1

## 2020-04-01 MED ORDER — VITAMIN D (ERGOCALCIFEROL) 1.25 MG (50000 UNIT) PO CAPS: 50000.0000 [IU] | ORAL_CAPSULE | ORAL | Status: DC

## 2020-04-01 MED FILL — Bortezomib For IV Inj 3.5 MG: INTRAVENOUS | Qty: 1 | Status: AC

## 2020-04-01 MED FILL — Bortezomib For IV Inj 3.5 MG: INTRAVENOUS | Qty: 1 | Status: CN

## 2020-04-01 NOTE — Progress Notes (Signed)
The patient was seen in infusion. He was walking from the bathroom with his IV pole when he caught his feet in the base of the IV pole and fell. The IV pole hit Mr. Primeau in his chest. He was not injuried. His vital signs returned with a BP of 185/92, P 88 and SP02 of 99% on room air. He reports that he did not take his BP medications this morning.  Sandi Mealy, MHS, PA-C Physician Assistant

## 2020-04-01 NOTE — Progress Notes (Signed)
Called pt to introduce myself as his Arboriculturist, discuss copay assistance and the [employer].  I spoke to his wife regarding copay assistance w/ LLS.  It wasn't a good time to talk so she requested I call their home phone and leave my name and number on their answering machine and she'll call me back at a more convenient time.

## 2020-04-01 NOTE — Evaluation (Signed)
Occupational Therapy Evaluation Patient Details Name: Devin [name] MRN: [id number] DOB: [birth year] Today's Date: 04/01/2020    History of Present Illness Devin [name] is Devin 80 y.o. male with medical history significant for multiple myeloma, HTN, HLD, hx of PE, RA who presents by EMS for evaluation of pain and unable to walk.  He is being treated by oncology for multiple myeloma.CT scan showed known lesions at L5 with Devin vertical fracture through the anterior body, no retropulsion    Neurosurgery called by ER and recommended Devin LSO brace to be placed on patient when he gets out of bed   Clinical Impression   Pt admitted with the above diagnosis and has the deficits listed below. Pt would benefit from cont OT to address adls in standing and functional transfers so he can eventually return home with his wife who can assist. Pt required Devin great amount of assist yesterday in PT(per chart) and less today in OT.  Feel this pt may have the opportunity to d/c home with [facility] services instead of snf if pain can be better controlled.  When pt is in less pain, he is much more independent.  Will update to home with [facility] if consistency remains that pt can do more for himself when pain is under control.  OT to focus on tasks in standing and how to modify tasks at home to be more independent and save energy.     Follow Up Recommendations  SNF;Supervision/Assistance - 24 hour    Equipment Recommendations  None recommended by OT    Recommendations for Other Services       Precautions / Restrictions Precautions Precautions: Fall;Back Required Braces or Orthoses: Spinal Brace Spinal Brace: Lumbar corset Restrictions Weight Bearing Restrictions: No      Mobility Bed Mobility Overal bed mobility: Needs Assistance Bed Mobility: Rolling;Sidelying to Sit Rolling: Min assist Sidelying to sit: Min assist       General bed mobility comments: Cues to use bedrails and to roll like Devin log to get up.     Transfers Overall transfer level: Needs assistance Equipment used: Rolling walker (2 wheeled) Transfers: Sit to/from [facility] Sit to Stand: Min assist Stand pivot transfers: Mod assist       General transfer comment: Pain most limiting factor. Cues for hand placement on walker.  Difficult to get pt comfortable in sitting.    Balance Overall balance assessment: History of Falls;Needs assistance Sitting-balance support: Bilateral upper extremity supported;Feet supported Sitting balance-Leahy Scale: Fair Sitting balance - Comments: braces self to lower pain in back   Standing balance support: Bilateral upper extremity supported;During functional activity Standing balance-Leahy Scale: Poor Standing balance comment: Pt heavily reliant on walker                           ADL either performed or assessed with clinical judgement   ADL Overall ADL's : Needs assistance/impaired Eating/Feeding: Set up;Sitting   Grooming: Wash/dry hands;Wash/dry face;Oral care;Applying deodorant;Brushing hair;Set up;Sitting   Upper Body Bathing: Set up;Sitting   Lower Body Bathing: Moderate assistance;Sit to/from stand;Cueing for compensatory techniques Lower Body Bathing Details (indicate cue type and reason): Pt braces himself with his arms when up in unsupported sitting.  States sitting hurts his bottom/at home too.  May want to consider special cushion when sitting. Upper Body Dressing : Minimal assistance;Sitting Upper Body Dressing Details (indicate cue type and reason): assist with brace Lower Body Dressing: Maximal assistance;Sit to/from stand;Cueing for  compensatory techniques Lower Body Dressing Details (indicate cue type and reason): pain limits pt from dressing LE. Toilet Transfer: Moderate assistance;RW;Stand-pivot;BSC   Toileting- Clothing Manipulation and Hygiene: Moderate assistance;Sitting/lateral lean;Cueing for compensatory techniques Toileting -  Clothing Manipulation Details (indicate cue type and reason): painful to clean self. May benefit from adaptive equipment to clean self if pain persists.     Functional mobility during ADLs: Moderate assistance;Rolling walker General ADL Comments: Pt much improved over yesterday (per chart). Pt mod assist to min (at times) with mobility in bed and into the chair. Pt most limited by pain when it comes to completing LE adls.     Vision Baseline Vision/History: No visual deficits Patient Visual Report: No change from baseline Vision Assessment?: No apparent visual deficits     Perception Perception Perception Tested?: No   Praxis Praxis Praxis tested?: Not tested    Pertinent Vitals/Pain Pain Assessment: 0-10 Pain Score: 8  Pain Location: lower back, both hips , down to thighs Pain Descriptors / Indicators: Aching;Grimacing;Discomfort Pain Intervention(s): Limited activity within patient's tolerance;Monitored during session;Premedicated before session;Repositioned;RN gave pain meds during session     Hand Dominance Right   Extremity/Trunk Assessment Upper Extremity Assessment Upper Extremity Assessment: Overall WFL for tasks assessed   Lower Extremity Assessment Lower Extremity Assessment: Defer to PT evaluation   Cervical / Trunk Assessment Cervical / Trunk Assessment: Other exceptions Cervical / Trunk Exceptions: difficult to  assess due to pain   Communication Communication Communication: No difficulties   Cognition Arousal/Alertness: Awake/alert Behavior During Therapy: WFL for tasks assessed/performed Overall Cognitive Status: Within Functional Limits for tasks assessed                                     General Comments  Pt most limited by pain.  Moved much better today with overall minimal assist with occasional mod assist.  If pain is managed, pt seems to have signficantly more independence.    Exercises     Shoulder Instructions      Home  Living Family/patient expects to be discharged to:: Private residence Living Arrangements: Spouse/significant other Available Help at Discharge: Family Type of Home: House Home Access: Stairs to enter Technical brewer of Steps: 2   Home Layout: One level     Bathroom Shower/Tub: Occupational psychologist: Handicapped height     Home Equipment: Environmental consultant - 2 wheels;Shower seat - built in;Bedside commode          Prior Functioning/Environment Level of Independence: Independent                 OT Problem List: Decreased activity tolerance;Impaired balance (sitting and/or standing);Decreased knowledge of use of DME or AE;Decreased knowledge of precautions;Pain      OT Treatment/Interventions: Self-care/ADL training;Therapeutic activities;Balance training    OT Goals(Current goals can be found in the care plan section) Acute Rehab OT Goals Patient Stated Goal: wants to walk OT Goal Formulation: With patient Time For Goal Achievement: 04/15/20 Potential to Achieve Goals: Good ADL Goals Pt Will Perform Grooming: standing;with supervision Pt Will Perform Upper Body Bathing: with supervision;sitting Pt Will Perform Lower Body Dressing: with supervision;with adaptive equipment;sit to/from stand Pt Will Perform Tub/Shower Transfer: Shower transfer;with min assist;ambulating;shower seat;rolling walker Additional ADL Goal #1: Pt will walk to bathroom with walker and complete all toileting on 3:1 over commode with supervision.  OT Frequency: Min 2X/week   Barriers to D/C:  wife  home to assist.       Co-evaluation              AM-PAC OT "6 Clicks" Daily Activity     Outcome Measure Help from another person eating meals?: None Help from another person taking care of personal grooming?: None Help from another person toileting, which includes using toliet, bedpan, or urinal?: Devin Little Help from another person bathing (including washing, rinsing, drying)?: Devin  Lot Help from another person to put on and taking off regular upper body clothing?: Devin Little Help from another person to put on and taking off regular lower body clothing?: Devin Lot 6 Click Score: 18   End of Session Equipment Utilized During Treatment: Rolling walker;Back brace Nurse Communication: Mobility status;Other (comment) (need for cushion for seat)  Activity Tolerance: Patient limited by pain Patient left: in chair;with call bell/phone within reach;with chair alarm set  OT Visit Diagnosis: Unsteadiness on feet (R26.81);Other abnormalities of gait and mobility (R26.89);Pain Pain - part of body:  (back)                Time: 1035-1105 OT Time Calculation (min): 30 min Charges:  OT General Charges $OT Visit: 1 Visit OT Evaluation $OT Eval Moderate Complexity: 1 Mod OT Treatments $Self Care/Home Management : 8-22 mins  Glenford Peers 04/01/2020, 11:22 AM

## 2020-04-01 NOTE — Progress Notes (Addendum)
PROGRESS NOTE  Devin [name] PTW:[id number] DOB: July 03, 1940 DOA: 03/30/2020 PCP: [name], MD   LOS: 1 day   Brief narrative:   Devin [name] is a 80 y.o. male with medical history significant for multiple myeloma, HTN, HLD, hx of PE, RA who presented hospital as brought by EMS for evaluation of ambulatory dysfunction and severe back pain.  Patient has history of rheumatoid arthritis and multiple myeloma currently receiving chemotherapy.  He stated that he did have a fall during chemotherapy and after which he had been having worsening pain and difficulty ambulating.  Patient was noted to have closed L5 vertebral fracture. Neurosurgery was called by ER and recommended a LSO brace to be placed on patient when he gets out of bed. He was not able to stand or walk in the ER.    Patient was then admitted to hospital for further evaluation and treatment.  Assessment/Plan:  Principal Problem:   Fracture of L5 vertebra (HCC) Active Problems:   Essential hypertension   Multiple myeloma (HCC)   Intractable pain   Impaired ambulation   Closed L5 vertebral fracture (HCC)  Fracture of the L5 vertebra with intractable pain. LSO brace as per neurosurgery recommendation.    Still complains of severe back pain.  ContinuePT OT.    As per PT OT likely need skilled nursing facility placement continue Dilaudid, morphine long-acting..  On Senokot at bedtime.  Continue MiraLAX as well.  We will add Lidoderm patch.  Add p.o. oxycodone as well to minimize IV Dilaudid.  Essential hypertension On losartan, Norvasc and labetalol. Monitor blood pressure.  Multiple myeloma  Followed by oncology as outpatient.  Has been receiving chemotherapy as outpatient and has received radiation as well in the past.  Ambulatory dysfunction Continue PT OT.  Likely need rehabilitation at the skilled nursing facility.  DVT prophylaxis: enoxaparin (LOVENOX) injection 40 mg Start: 03/31/20 1000    Code  Status: Full code  Family Communication: None today.  Status is: Inpatient  Remains inpatient appropriate because:IV treatments appropriate due to intensity of illness or inability to take PO and Inpatient level of care appropriate due to severity of illness   Dispo: The patient is from: Home              Anticipated d/c is to: Skilled nursing facility placement as per PT evaluation.  Spoke with the patient about it.  He is unsure about his disposition yet.  We will consult transition of care team              Anticipated d/c date is: 2 days              Patient currently is not medically stable to d/c.  Consultants:  Neurosurgery  Procedures:  None  Anti-infectives:   Anti-infectives (From admission, onward)   None     Subjective: Today, patient was seen and examined at bedside.  Complains of severe back pain and asking for pain medication prior to PT   Objective: Vitals:   04/01/20 0549 04/01/20 0931  BP: 106/69 122/84  Pulse: 75 70  Resp: 18 16  Temp: 99.3 F (37.4 C) 98.7 F (37.1 C)  SpO2: 99% 96%    Intake/Output Summary (Last 24 hours) at 04/01/2020 1214 Last data filed at 04/01/2020 1000 Gross per 24 hour  Intake 1679.17 ml  Output 2625 ml  Net -945.83 ml   Filed Weights   03/30/20 1733 03/31/20 1342  Weight: 79.2 kg 79.2 kg  Body mass index is 25.05 kg/m.   Physical Exam: GENERAL: Patient is alert awake and oriented.  In mild distress due to pain, HENT: No scleral pallor or icterus. Pupils equally reactive to light. Oral mucosa is moist NECK: is supple, no gross swelling noted. CHEST: Clear to auscultation. No crackles or wheezes.  Diminished breath sounds bilaterally. CVS: S1 and S2 heard, no murmur. Regular rate and rhythm.  Lumbar brace in place. ABDOMEN: Soft, non-tender, bowel sounds are present. EXTREMITIES: No edema. CNS: Cranial nerves are intact. No focal motor deficits. SKIN: warm and dry without rashes.  Data Review: I have  personally reviewed the following laboratory data and studies,  CBC: Recent Labs  Lab 03/27/20 1358 03/30/20 1959 03/31/20 0455  WBC 11.8* 5.8 5.3  NEUTROABS 11.0* 4.7  --   HGB 12.3* 13.0 12.6*  HCT 35.4* 37.3* 36.9*  MCV 99.7 100.8* 101.7*  PLT 119* 85* 82*   Basic Metabolic Panel: Recent Labs  Lab 03/27/20 1358 03/30/20 1959 03/31/20 0455  NA 129* 130* 129*  K 4.4 4.1 4.1  CL 97* 95* 94*  CO2 $Re'22 26 24  'YzI$ GLUCOSE 178* 147* 133*  BUN 30* 23 22  CREATININE 1.22 1.08 1.20  CALCIUM 8.7* 8.4* 8.4*   Liver Function Tests: Recent Labs  Lab 03/27/20 1358  AST 15  ALT 23  ALKPHOS 75  BILITOT 0.9  PROT 6.8  ALBUMIN 3.4*   No results for input(s): LIPASE, AMYLASE in the last 168 hours. No results for input(s): AMMONIA in the last 168 hours. Cardiac Enzymes: No results for input(s): CKTOTAL, CKMB, CKMBINDEX, TROPONINI in the last 168 hours. BNP (last 3 results) No results for input(s): BNP in the last 8760 hours.  ProBNP (last 3 results) No results for input(s): PROBNP in the last 8760 hours.  CBG: No results for input(s): GLUCAP in the last 168 hours. Recent Results (from the past 240 hour(s))  SARS CORONAVIRUS 2 (TAT 6-24 HRS) Nasopharyngeal Nasopharyngeal Swab     Status: None   Collection Time: 03/31/20 12:12 AM   Specimen: Nasopharyngeal Swab  Result Value Ref Range Status   SARS Coronavirus 2 NEGATIVE NEGATIVE Final    Comment: (NOTE) SARS-CoV-2 target nucleic acids are NOT DETECTED.  The SARS-CoV-2 RNA is generally detectable in upper and lower respiratory specimens during the acute phase of infection. Negative results do not preclude SARS-CoV-2 infection, do not rule out co-infections with other pathogens, and should not be used as the sole basis for treatment or other patient management decisions. Negative results must be combined with clinical observations, patient history, and epidemiological information. The expected result is Negative.  Fact  Sheet for Patients: SugarRoll.be  Fact Sheet for Healthcare Providers: https://www.woods-mathews.com/  This test is not yet approved or cleared by the [place] FDA and  has been authorized for detection and/or diagnosis of SARS-CoV-2 by FDA under an Emergency Use Authorization (EUA). This EUA will remain  in effect (meaning this test can be used) for the duration of the COVID-19 declaration under Se ction 564(b)(1) of the Act, 21 U.S.C. section 360bbb-3(b)(1), unless the authorization is terminated or revoked sooner.  Performed at [facility], [place] [address] [place], [place] [address]      Studies: DG Thoracic Spine 2 View  Result Date: 03/30/2020 CLINICAL DATA:  Fall at chemo on Friday with thoracic back pain. EXAM: THORACIC SPINE 2 VIEWS COMPARISON:  Chest CT 05/28/2018. FINDINGS: Chronic T4 compression fracture is unchanged from prior chest CT. No evidence  of new or compression deformity or acute fracture. Flowing anterior osteophytes throughout the thoracic spine. Mild multilevel disc space narrowing. No paravertebral soft tissue abnormality to suggest fracture. No radiographic evidence of focal lesion. IMPRESSION: 1. No acute fracture or subluxation of the thoracic spine. 2. Chronic T4 compression fracture. Multilevel degenerative disc disease and DISH. Electronically Signed   By: Narda Rutherford M.D.   On: 03/30/2020 18:55   DG Lumbar Spine Complete  Result Date: 03/30/2020 CLINICAL DATA:  Fall a chemotherapy on Friday with continued back pain. EXAM: LUMBAR SPINE - COMPLETE 4+ VIEW COMPARISON:  PET CT 02/03/2020.  Lumbar MRI 01/08/2020 FINDINGS: Lytic lesion within L5 vertebral body, chronic mild superior endplate flattening appears similar to prior imaging. No evidence of new or acute fracture. The remaining vertebral body heights are preserved. No obvious new focal lesion by radiograph. Known left iliac lesion is not well seen.  IMPRESSION: 1. No evidence of acute fracture of the lumbar spine. 2. Lytic lesion in the L5 vertebral body with slight loss of height, grossly unchanged from recent PET/CT. No visualized new lumbar lesions. Electronically Signed   By: Narda Rutherford M.D.   On: 03/30/2020 18:53   CT Thoracic Spine Wo Contrast  Result Date: 03/30/2020 CLINICAL DATA:  Fall. Currently on chemotherapy. Renal cell carcinoma. EXAM: CT THORACIC AND LUMBAR SPINE WITHOUT CONTRAST TECHNIQUE: Multidetector CT imaging of the thoracic and lumbar spine was performed without contrast. Multiplanar CT image reconstructions were also generated. COMPARISON:  None. FINDINGS: CT THORACIC SPINE FINDINGS Alignment: Normal Vertebrae: There is ankylosis from T3-T11. No lytic or blastic lesion. No acute fracture. Moderate height loss at T4 appears chronic. Paraspinal and other soft tissues: Calcific aortic atherosclerosis. Disc levels: Spinal canal patency is maintained. CT LUMBAR SPINE FINDINGS Segmentation: Standard Alignment: Normal Vertebrae: Mottled lytic lesions of L5 with vertically oriented fracture through the midportion of the vertebral body. Fracture also involves anterior wall. No retropulsion. Mild height loss. Otherwise, the bones are osteopenic without other focal lesion. Paraspinal and other soft tissues: Calcific atherosclerosis. Disc levels: No spinal canal stenosis. IMPRESSION: 1. Mottled lytic lesions of the L5 vertebral body with vertically oriented pathologic fracture through the midportion of the vertebral body and anterior wall, with mild height loss. No retropulsion or spinal canal stenosis. 2. No acute fracture or static subluxation of the thoracic or lumbar spine. 3. Moderate height loss at T4 appears chronic. 4. T3-T11 ankylosis. Aortic Atherosclerosis (ICD10-I70.0). Electronically Signed   By: Deatra Robinson M.D.   On: 03/30/2020 23:02   CT Lumbar Spine Wo Contrast  Result Date: 03/30/2020 CLINICAL DATA:  Fall. Currently  on chemotherapy. Renal cell carcinoma. EXAM: CT THORACIC AND LUMBAR SPINE WITHOUT CONTRAST TECHNIQUE: Multidetector CT imaging of the thoracic and lumbar spine was performed without contrast. Multiplanar CT image reconstructions were also generated. COMPARISON:  None. FINDINGS: CT THORACIC SPINE FINDINGS Alignment: Normal Vertebrae: There is ankylosis from T3-T11. No lytic or blastic lesion. No acute fracture. Moderate height loss at T4 appears chronic. Paraspinal and other soft tissues: Calcific aortic atherosclerosis. Disc levels: Spinal canal patency is maintained. CT LUMBAR SPINE FINDINGS Segmentation: Standard Alignment: Normal Vertebrae: Mottled lytic lesions of L5 with vertically oriented fracture through the midportion of the vertebral body. Fracture also involves anterior wall. No retropulsion. Mild height loss. Otherwise, the bones are osteopenic without other focal lesion. Paraspinal and other soft tissues: Calcific atherosclerosis. Disc levels: No spinal canal stenosis. IMPRESSION: 1. Mottled lytic lesions of the L5 vertebral body with vertically oriented pathologic  fracture through the midportion of the vertebral body and anterior wall, with mild height loss. No retropulsion or spinal canal stenosis. 2. No acute fracture or static subluxation of the thoracic or lumbar spine. 3. Moderate height loss at T4 appears chronic. 4. T3-T11 ankylosis. Aortic Atherosclerosis (ICD10-I70.0). Electronically Signed   By: Ulyses Jarred M.D.   On: 03/30/2020 23:02   CT PELVIS WO CONTRAST  Result Date: 03/30/2020 CLINICAL DATA:  Pelvic trauma. Fall it chemo on Friday with continued pain. Known osseous metastatic disease. EXAM: CT PELVIS WITHOUT CONTRAST TECHNIQUE: Multidetector CT imaging of the pelvis was performed following the standard protocol without intravenous contrast. COMPARISON:  PET CT 02/03/2020, lumbar MRI 01/08/2020 FINDINGS: Urinary Tract: Decompressed distal ureters. Mass effect on the urinary bladder  from enlarged prostate gland, no definite bladder wall thickening. Bowel: No acute bowel inflammation. Moderate stool in the included colon. There is stool distending the rectum. Vascular/Lymphatic: Dense aorto bi-iliac atherosclerosis. Iliac vessels are tortuous. No enlarged pelvic lymph nodes. 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 Other: Prior right inguinal hernia repair with tacks. There is minimal fat in the left inguinal canal. No pelvic free fluid. Musculoskeletal: Known L5 lytic with pathologic fracture. The small sacral metastatic lesions are not well seen by CT. No evidence of pelvic fracture, detailed assessment limited by osseous demineralization. Left iliac low-density lesion measures fat and is consistent with a benign intraosseous lipoma. Scattered small scerlotic foci were not hypermetabolic on prior PET. 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 There is no confluent soft tissue hematoma. IMPRESSION: 1. No evidence of acute pelvic fracture. 2. Known L5 lytic with pathologic fracture, assessed on concurrent lumbar spine CT. The small sacral metastatic lesions on prior PET are not well seen by CT. 3. 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 Aortic Atherosclerosis (ICD10-I70.0). Electronically Signed   By: Keith Rake M.D.   On: 03/30/2020 22:56      Flora Lipps, MD  [facility] 04/01/2020  If 7PM-7AM, please contact night-coverage

## 2020-04-02 ENCOUNTER — Other Ambulatory Visit: Payer: Self-pay

## 2020-04-02 ENCOUNTER — Encounter: Payer: Self-pay | Admitting: Oncology

## 2020-04-02 ENCOUNTER — Telehealth: Payer: Self-pay

## 2020-04-02 DIAGNOSIS — C9000 Multiple myeloma not having achieved remission: Secondary | ICD-10-CM

## 2020-04-02 LAB — CBC
HCT: 34.3 % — ABNORMAL LOW (ref 39.0–52.0)
Hemoglobin: 12 g/dL — ABNORMAL LOW (ref 13.0–17.0)
MCH: 35 pg — ABNORMAL HIGH (ref 26.0–34.0)
MCHC: 35 g/dL (ref 30.0–36.0)
MCV: 100 fL (ref 80.0–100.0)
Platelets: 74 10*3/uL — ABNORMAL LOW (ref 150–400)
RBC: 3.43 MIL/uL — ABNORMAL LOW (ref 4.22–5.81)
RDW: 13.6 % (ref 11.5–15.5)
WBC: 4.9 10*3/uL (ref 4.0–10.5)
nRBC: 0 % (ref 0.0–0.2)

## 2020-04-02 LAB — BASIC METABOLIC PANEL
Anion gap: 9 (ref 5–15)
BUN: 16 mg/dL (ref 8–23)
CO2: 24 mmol/L (ref 22–32)
Calcium: 8.7 mg/dL — ABNORMAL LOW (ref 8.9–10.3)
Chloride: 94 mmol/L — ABNORMAL LOW (ref 98–111)
Creatinine, Ser: 1.08 mg/dL (ref 0.61–1.24)
GFR, Estimated: >60 mL/min (ref >60)
Glucose, Bld: 151 mg/dL — ABNORMAL HIGH (ref 70–99)
Potassium: 4 mmol/L (ref 3.5–5.1)
Sodium: 127 mmol/L — ABNORMAL LOW (ref 135–145)

## 2020-04-02 NOTE — Plan of Care (Signed)
  Problem: Education: Goal: Knowledge of General Education information will improve Description: Including pain rating scale, medication(s)/side effects and non-pharmacologic comfort measures Outcome: Progressing   Problem: Health Behavior/Discharge Planning: Goal: Ability to manage health-related needs will improve Outcome: Progressing   Problem: Clinical Measurements: Goal: Ability to maintain clinical measurements within normal limits will improve Outcome: Progressing Goal: Will remain free from infection Outcome: Progressing   Problem: Activity: Goal: Risk for activity intolerance will decrease Outcome: Progressing   Problem: Elimination: Goal: Will not experience complications related to bowel motility Outcome: Progressing Goal: Will not experience complications related to urinary retention Outcome: Progressing   Problem: Pain Managment: Goal: General experience of comfort will improve Outcome: Progressing   Problem: Safety: Goal: Ability to remain free from injury will improve Outcome: Progressing   Problem: Skin Integrity: Goal: Risk for impaired skin integrity will decrease Outcome: Progressing

## 2020-04-02 NOTE — Telephone Encounter (Signed)
Attempted to call patient's wife. No answer. Voicemail not set up. Sent a message through my chart to let patient's wife know Dr. Hazeline Junker response to her questions sent earlier today on my chart.

## 2020-04-02 NOTE — [place] FL2 (Addendum)
[place] MEDICAID FL2 LEVEL OF CARE SCREENING TOOL     IDENTIFICATION  Patient Name: Devin [name] Birthdate: August 30, 1940 Sex: male Admission Date (Current Location): 03/30/2020  [facility] and [place] Number:  Producer, television/film/video and Address:  [facility],  [address], [place] [address]      Provider Number: [id number]  Attending Physician Name and Address:  Joycelyn Das, MD  Relative Name and Phone Number:  Amit, Zirkel [phone]  [phone]    Current Level of Care: Hospital Recommended Level of Care: Skilled Nursing Facility Prior Approval Number:    Date Approved/Denied:   PASRR Number:   [id number]  Discharge Plan: SNF    Current Diagnoses: Patient Active Problem List   Diagnosis Date Noted  . Fracture of L5 vertebra (HCC) 03/31/2020  . Intractable pain 03/31/2020  . Impaired ambulation 03/31/2020  . Closed L5 vertebral fracture (HCC) 03/31/2020  . Chronic kidney disease, stage 3 unspecified (HCC) 03/03/2020  . 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/2020  . Essential hypertension 03/03/2020  . Hardening of the aorta (main artery of the heart) (HCC) 03/03/2020  . Iron deficiency anemia 03/03/2020  . Multiple myeloma (HCC) 03/03/2020  . Obesity 03/03/2020  . Personal history of colonic polyps 03/03/2020  . Personal history of pulmonary embolism 03/03/2020  . Prediabetes 03/03/2020  . Primary gout 03/03/2020  . Pure hypercholesterolemia 03/03/2020  . 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HCC) 03/03/2020  . Thoracic aortic aneurysm without rupture (HCC) 03/03/2020  . Type 2 diabetes mellitus (HCC) 03/03/2020  . Vitamin D deficiency 03/03/2020  . Malignant tumor of kidney (HCC) 03/03/2020  . 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 Renal lesion 05/06/2016  . Delirium 05/05/2016  . Pulmonary embolus (HCC) 05/05/2016  . 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 Osteoarthritis of right knee 04/20/2016  . Rheumatoid  arthritis involving right knee (HCC) 04/20/2016  . Inguinal hernia without mention of obstruction or gangrene, unilateral or unspecified, (not specified as recurrent)-left 08/10/2011    Orientation RESPIRATION BLADDER Height & Weight     Self,Time,Situation,Place  Normal Continent Weight: 174 lb 9.7 oz (79.2 kg) Height:  5\' 10"  (177.8 cm)  BEHAVIORAL SYMPTOMS/MOOD NEUROLOGICAL BOWEL NUTRITION STATUS      Continent Diet (Heart Healthy)  AMBULATORY STATUS COMMUNICATION OF NEEDS Skin   Extensive Assist Verbally Normal                       Personal Care Assistance Level of Assistance  Bathing,Feeding,Dressing Bathing Assistance: Limited assistance Feeding assistance: Independent Dressing Assistance: Limited assistance     Functional Limitations Info  Sight,Hearing,Speech Sight Info: Adequate Hearing Info: Adequate Speech Info: Adequate    SPECIAL CARE FACTORS FREQUENCY  PT (By licensed PT),OT (By licensed OT)     PT Frequency: 5x/week OT Frequency: 5x/week            Contractures Contractures Info: Not present    Additional Factors Info  Code Status,Allergies Code Status Info: Fullcode Allergies Info: Allergies: Amlodipine, Pravastatin, Zestril (Lisinopril), Oxycodone           Current Medications (04/02/2020):  This is the current hospital active medication list Current Facility-Administered Medications  Medication Dose Route Frequency Provider Last Rate Last Admin  . acetaminophen (TYLENOL) tablet 650 mg  650 mg Oral Q6H PRN Chotiner, Claudean Severance, MD       Or  . acetaminophen (TYLENOL) suppository 650 mg  650 mg Rectal Q6H PRN Chotiner, Claudean Severance, MD      .  acyclovir (ZOVIRAX) tablet 400 mg  400 mg Oral Daily Pokhrel, Laxman, MD   400 mg at 04/02/20 1011  . allopurinol (ZYLOPRIM) tablet 300 mg  300 mg Oral Daily Pokhrel, Laxman, MD   300 mg at 04/02/20 1011  . amLODipine (NORVASC) tablet 10 mg  10 mg Oral Daily Pokhrel, Laxman, MD   10 mg at 04/02/20 1011   . aspirin EC tablet 81 mg  81 mg Oral Daily Pokhrel, Laxman, MD   81 mg at 04/02/20 1011  . enoxaparin (LOVENOX) injection 40 mg  40 mg Subcutaneous Q24H Chotiner, Claudean Severance, MD   40 mg at 04/02/20 1013  . folic acid (FOLVITE) tablet 1 mg  1 mg Oral Daily Pokhrel, Laxman, MD   1 mg at 04/02/20 1011  . HYDROmorphone (DILAUDID) injection 1 mg  1 mg Intravenous Q3H PRN Chotiner, Claudean Severance, MD   1 mg at 04/02/20 1436  . labetalol (NORMODYNE) tablet 300 mg  300 mg Oral BID Chotiner, Claudean Severance, MD   300 mg at 04/02/20 1011  . lidocaine (LIDODERM) 5 % 1 patch  1 patch Transdermal Q24H Pokhrel, Laxman, MD   1 patch at 04/02/20 1406  . losartan (COZAAR) tablet 100 mg  100 mg Oral Daily Chotiner, Claudean Severance, MD   100 mg at 04/02/20 1011  . morphine (MS CONTIN) 12 hr tablet 15 mg  15 mg Oral Q12H Chotiner, Claudean Severance, MD   15 mg at 04/02/20 1011  . morphine (MSIR) tablet 15 mg  15 mg Oral Q4H PRN Pokhrel, Laxman, MD   15 mg at 04/02/20 1226  . polyethylene glycol (MIRALAX / GLYCOLAX) packet 17 g  17 g Oral Daily Pokhrel, Laxman, MD   17 g at 04/01/20 1015  . rosuvastatin (CRESTOR) tablet 10 mg  10 mg Oral Daily Chotiner, Claudean Severance, MD   10 mg at 04/02/20 1011  . senna-docusate (Senokot-S) tablet 1 tablet  1 tablet Oral QHS PRN Chotiner, Claudean Severance, MD      . Melene Muller ON 04/06/2020] Vitamin D (Ergocalciferol) (DRISDOL) capsule 50,000 Units  50,000 Units Oral Q14 Days Pokhrel, Laxman, MD         Discharge Medications: Please see discharge summary for a list of discharge medications.  Relevant Imaging Results:  Relevant Lab Results:   Additional Information ssn: [id number]  Clearance Coots, LCSW

## 2020-04-02 NOTE — Progress Notes (Addendum)
PROGRESS NOTE  Devin [name] ZWC:[id number] DOB: [birth year] DOA: 03/30/2020 PCP: [name], MD   LOS: 2 days   Brief narrative:  Devin [name] is a 80 y.o. male with medical history significant for multiple myeloma, HTN, HLD, hx of PE, RA who presented hospital as brought by EMS for evaluation of ambulatory dysfunction and severe back pain.  Patient has history of rheumatoid arthritis and multiple myeloma currently receiving chemotherapy.  He stated that he did have a fall during chemotherapy and after which he had been having worsening pain and difficulty ambulating.  Patient was noted to have closed L5 vertebral fracture. Neurosurgery was called by ER and recommended a LSO brace to be placed on patient when he gets out of bed. He was not able to stand or walk in the ER.    Patient was then admitted to hospital for further evaluation and treatment.  Assessment/Plan:  Principal Problem:   Fracture of L5 vertebra (HCC) Active Problems:   Essential hypertension   Multiple myeloma (HCC)   Intractable pain   Impaired ambulation   Closed L5 vertebral fracture (HCC)  Fracture of the L5 vertebra with intractable pain. LSO brace as per neurosurgery recommendation.    Still complains of severe back pain.  Continue PT OT.    As per PT OT likely need skilled nursing facility placement.  Spoke with the patient about it who was not sure about this.  I also spoke with the patient's wife about disposition.  Continue morphine IR, Dilaudid IV,, morphine long-acting.  Patient chronically takes morphine long-acting and short acting at home.    Continue Lidoderm patch.  Continue bowel regimen.  Essential hypertension On losartan, Norvasc and labetalol. Blood pressure seems to be stable  Multiple myeloma  Followed by oncology as outpatient.  Has been receiving chemotherapy as outpatient and has received radiation as well in the past.  Ambulatory dysfunction Continue PT, OT.  Likely need  rehabilitation at the skilled nursing facility.  Hyponatremia.  Could be secondary to paraproteinemia.  Will monitor.  DVT prophylaxis: enoxaparin (LOVENOX) injection 40 mg Start: 03/31/20 1000  Code Status: Full code  Family Communication: I had a prolonged discussion with the patient's spouse on the phone today.  Status is: Inpatient  Remains inpatient appropriate because:IV treatments appropriate due to intensity of illness or inability to take PO and Inpatient level of care appropriate due to severity of illness, likely need rehabilitation.   Dispo: The patient is from: Home              Anticipated d/c is to: Skilled nursing facility placement as per PT evaluation.  Spoke with the patient's spouse who is unsure about disposition yet              Anticipated d/c date is: 1 to 2 days              Patient currently is  medically stable to d/c.  Consultants:  Neurosurgery  Procedures:  None  Anti-infectives:   Anti-infectives (From admission, onward)   Start     Dose/Rate Route Frequency Ordered Stop   04/01/20 1330  acyclovir (ZOVIRAX) tablet 400 mg        400 mg Oral Daily 04/01/20 1232       Subjective: Today, patient was seen and examined at visit.  Denies any nausea vomiting abdominal pain.  Still complains of mild back pain.  On multiple pain medications    Objective: Vitals:   04/02/20 0529  04/02/20 1335  BP: 105/73 120/79  Pulse: 69 68  Resp: 18 18  Temp: 98.7 F (37.1 C) 97.9 F (36.6 C)  SpO2: 98% 99%    Intake/Output Summary (Last 24 hours) at 04/02/2020 1336 Last data filed at 04/02/2020 1000 Gross per 24 hour  Intake 220 ml  Output 375 ml  Net -155 ml   Filed Weights   03/30/20 1733 03/31/20 1342  Weight: 79.2 kg 79.2 kg   Body mass index is 25.05 kg/m.   Physical Exam:  General: Alert awake oriented, not in obvious distress, HENT:   No scleral pallor or icterus noted. Oral mucosa is moist.  Chest:  Clear breath sounds.  Diminished  breath sounds bilaterally. No crackles or wheezes.  CVS: S1 &S2 heard. No murmur.  Regular rate and rhythm. Abdomen: Soft, nontender, nondistended.  Bowel sounds are heard.  Lumbar brace in place Extremities: No cyanosis, clubbing or edema.  Peripheral pulses are palpable. Psych: Alert, awake and oriented, normal mood CNS:  No cranial nerve deficits.  Moves all extremities Skin: Warm and dry.  No rashes noted.  Data Review: I have personally reviewed the following laboratory data and studies,  CBC: Recent Labs  Lab 03/27/20 1358 03/30/20 1959 03/31/20 0455 04/02/20 0555  WBC 11.8* 5.8 5.3 4.9  NEUTROABS 11.0* 4.7  --   --   HGB 12.3* 13.0 12.6* 12.0*  HCT 35.4* 37.3* 36.9* 34.3*  MCV 99.7 100.8* 101.7* 100.0  PLT 119* 85* 82* 74*   Basic Metabolic Panel: Recent Labs  Lab 03/27/20 1358 03/30/20 1959 03/31/20 0455 04/02/20 0555  NA 129* 130* 129* 127*  K 4.4 4.1 4.1 4.0  CL 97* 95* 94* 94*  CO2 _0 GLUCOSE 178* 147* 133* 151*  BUN 30* _1 CREATININE 1.22 1.08 1.20 1.08  CALCIUM 8.7* 8.4* 8.4* 8.7*   Liver Function Tests: Recent Labs  Lab 03/27/20 1358  AST 15  ALT 23  ALKPHOS 75  BILITOT 0.9  PROT 6.8  ALBUMIN 3.4*   No results for input(s): LIPASE, AMYLASE in the last 168 hours. No results for input(s): AMMONIA in the last 168 hours. Cardiac Enzymes: No results for input(s): CKTOTAL, CKMB, CKMBINDEX, TROPONINI in the last 168 hours. BNP (last 3 results) No results for input(s): BNP in the last 8760 hours.  ProBNP (last 3 results) No results for input(s): PROBNP in the last 8760 hours.  CBG: No results for input(s): GLUCAP in the last 168 hours. Recent Results (from the past 240 hour(s))  SARS CORONAVIRUS 2 (TAT 6-24 HRS) Nasopharyngeal Nasopharyngeal Swab     Status: None   Collection Time: 03/31/20 12:12 AM   Specimen: Nasopharyngeal Swab  Result Value Ref Range Status   SARS Coronavirus 2 NEGATIVE NEGATIVE Final    Comment:  (NOTE) SARS-CoV-2 target nucleic acids are NOT DETECTED.  The SARS-CoV-2 RNA is generally detectable in upper and lower respiratory specimens during the acute phase of infection. Negative results do not preclude SARS-CoV-2 infection, do not rule out co-infections with other pathogens, and should not be used as the sole basis for treatment or other patient management decisions. Negative results must be combined with clinical observations, patient history, and epidemiological information. The expected result is Negative.  Fact Sheet for Patients: SugarRoll.be  Fact Sheet for Healthcare Providers: https://www.woods-mathews.com/  This test is not yet approved or cleared by the [place] FDA and  has been authorized for detection and/or diagnosis of SARS-CoV-2 by FDA under  an Emergency Use Authorization (EUA). This EUA will remain  in effect (meaning this test can be used) for the duration of the COVID-19 declaration under Se ction 564(b)(1) of the Act, 21 U.S.C. section 360bbb-3(b)(1), unless the authorization is terminated or revoked sooner.  Performed at [facility], [place] [address], [place], [place] [address]      Studies: No results found.    Flora Lipps, MD  [facility] 04/02/2020  If 7PM-7AM, please contact night-coverage

## 2020-04-02 NOTE — Progress Notes (Signed)
Physical Therapy Treatment Patient Details Name: Devin [name] MRN: [id number] DOB: [birth year] Today's Date: 04/02/2020    History of Present Illness A Devin [name] is a 80 y.o. male with medical history significant for multiple myeloma, HTN, HLD, hx of PE, RA who presents by EMS for evaluation of pain and unable to walk.  He is being treated by oncology for multiple myeloma.CT scan showed known lesions at L5 with a vertical fracture through the anterior body, no retropulsion    Neurosurgery called by ER and recommended a LSO brace to be placed on patient when he gets out of bed.    PT Comments    Spent a long sesion with pt and patient's wife educating on back tips and tricks to decrease pressure and pain in lumbar area. Worked with bed positioning, bed mobility, sitting tips with built up portion on the posterior part of a chair, ambulation and progression education.  Pt has shown increased mobility over past day, however it is dependent on the pain at the fracture site with each movement. Pt is having pain more in bilateral buttocks area and down Left anterior portion of leg with increased sitting, standing and walking.  Educated on how to alternate heat and ice ( only if you are not using a local patch) for pain control. At this time still recommending SNF, however if patietn continues to show consistent progress and/or family feels they can provide this type of assist at home then recommend HHPT and OT . Will conintue to follow while here in acute.    Follow Up Recommendations  SNF (unless pt progresses to be able to DC home with wife supervision, min A)     Equipment Recommendations  None recommended by PT (pt has RW and 3n1 already)    Recommendations for Other Services       Precautions / Restrictions Precautions Precautions: Fall;Back Required Braces or Orthoses: Spinal Brace (when OOB) Spinal Brace: Lumbar corset    Mobility  Bed Mobility Overal bed mobility: Needs Assistance Bed  Mobility: Rolling;Sidelying to Sit Rolling: Min assist (use of rails) Sidelying to sit: Min assist     Sit to sidelying: Mod assist General bed mobility comments: Cues to use bedrails and to roll like a log to get up. reeducated with log roll, postioing in bed, and sidelying to sit with assistance required.  Transfers Overall transfer level: Needs assistance Equipment used: Rolling walker (2 wheeled) Transfers: Sit to/from Stand Sit to Stand: Min assist         General transfer comment: Pain most limiting factor. Cues for hand placement on walker and cues to brace on thighs if needed for decent from standing position.  Difficult to get pt comfortable in sitting. Educated pt and wife on folded towel at back portion of chair , car seaat , etc to elevate hips above knees to decrease pressure on L5 area.  Ambulation/Gait Ambulation/Gait assistance: Min assist Gait Distance (Feet): 18 Feet Assistive device: Rolling walker (2 wheeled) Gait Pattern/deviations: Step-through pattern     General Gait Details: heavy reliance on UE on RW to support and decrease pressure off his back . Safety cues to keep hips in the RW base area. With increaesd distanc pt had more pain in is back and down his Left leg that prevented him from going any futher. We did try to stand and ambulate a 2nd time after restting supine in bed, however once we got to standing a second time, his pain had increased too  mocuh to tolerate trying to ambulate again. He had to return to supine in bed.   Stairs             Wheelchair Mobility    Modified Rankin (Stroke Patients Only)       Balance Overall balance assessment: History of Falls;Needs assistance Sitting-balance support: Bilateral upper extremity supported;Feet supported Sitting balance-Leahy Scale: Fair Sitting balance - Comments: braces self to lower pain in back   Standing balance support: Bilateral upper extremity supported;During functional  activity Standing balance-Leahy Scale: Poor Standing balance comment: Pt heavily reliant on walker                            Cognition Arousal/Alertness: Awake/alert Behavior During Therapy: WFL for tasks assessed/performed Overall Cognitive Status: Within Functional Limits for tasks assessed                                        Exercises      General Comments        Pertinent Vitals/Pain Pain Assessment: 0-10 Pain Score: 8  (2/10 in supine resting position, up to 8-9/10 when moving and sitting and standing) Faces Pain Scale: Hurts whole lot Pain Location: pain mostly in Bilateral buttocks area and down the left leg anteriorly. Not a lot of pain in the posterior L5 area Pain Descriptors / Indicators: Aching;Grimacing;Discomfort Pain Intervention(s): Monitored during session;Other (comment) (educated a lot for postioning to decrease pressure and painin lumbar region. Patch placed on pt's back once session over by the nurse)    Home Living                      Prior Function            PT Goals (current goals can now be found in the care plan section) Acute Rehab PT Goals Patient Stated Goal: wants to walk PT Goal Formulation: With patient/family Time For Goal Achievement: 04/14/20 Potential to Achieve Goals: Fair Progress towards PT goals: Progressing toward goals    Frequency    Min 3X/week      PT Plan Discharge plan needs to be updated    Co-evaluation              AM-PAC PT "6 Clicks" Mobility   Outcome Measure  Help needed turning from your back to your side while in a flat bed without using bedrails?: A Lot Help needed moving from lying on your back to sitting on the side of a flat bed without using bedrails?: A Lot Help needed moving to and from a bed to a chair (including a wheelchair)?: A Lot Help needed standing up from a chair using your arms (e.g., wheelchair or bedside chair)?: A Lot Help needed to  walk in hospital room?: A Lot Help needed climbing 3-5 steps with a railing? : A Lot 6 Click Score: 12    End of Session   Activity Tolerance: Patient limited by pain Patient left: in bed;with call bell/phone within reach;with family/visitor present Nurse Communication: Mobility status PT Visit Diagnosis: Unsteadiness on feet (R26.81);Muscle weakness (generalized) (M62.81)     Time: 6578-4696 PT Time Calculation (min) (ACUTE ONLY): 58 min  Charges:  $Gait Training: 23-37 mins $Therapeutic Activity: 23-37 mins  Gatha Mayer, PT, MPT Acute Rehabilitation Services Office: [phone] Pager: [phone] 04/02/2020    Clide Dales 04/02/2020, 3:00 PM

## 2020-04-02 NOTE — TOC Initial Note (Signed)
Transition of Care [facility]) - Initial/Assessment Note    Patient Details  Name: Devin [name] MRN: [id number] Date of Birth: Apr 17, 1940  Transition of Care [facility]) CM/SW Contact:    Lia Hopping, [place] Phone Number: 04/02/2020, 1:20 PM  Clinical Narrative:  Re: Home vs. SNF.  Patient admitted for pain in his legs and back. He was found to have L5 closed vertebral fracture. Patient is being treated by oncology for multiple myeloma.               CSW met with the patient and his spouse at bedside to discuss patient plan home vs. SNF. Patient prefers to return home with home health services. Patient spouse wants him to be more independent. During the visit. The patient walked a short distance to the bathroom with nurses assistance. After seeing the patient ability to move with assistance spouse has requested to see the patient work with therapy to determine a disposition.  CSW explain the SNF process and will follow up with a list of bed offers w/Medicare ratings.  PT to see the patient.    Expected Discharge Plan: [place] Barriers to Discharge: Continued Medical Work up   Patient Goals and CMS Choice Patient states their goals for this hospitalization and ongoing recovery are:: return home CMS Medicare.gov Compare Post Acute Care list provided to:: Patient Choice offered to / list presented to : [facility]  Expected Discharge Plan and Services Expected Discharge Plan: [place] In-house Referral: Clinical Social Work Discharge Planning Services: CM Consult Post Acute Care Choice: [place] arrangements for the past 2 months: [place]                                      Prior Living Arrangements/Services Living arrangements for the past 2 months: Single Family Home Lives with:: Spouse Patient language and need for interpreter reviewed:: No        Need for Family Participation in Patient Care: Yes  (Comment) Care giver support system in place?: Yes (comment) Current home services: DME Criminal Activity/Legal Involvement Pertinent to Current Situation/Hospitalization: No - Comment as needed  Activities of Daily Living Home Assistive Devices/Equipment: Cane (specify quad or straight),Dentures (specify type),Walker (specify type) (single point cane, front wheeled walker) ADL Screening (condition at time of admission) Patient's cognitive ability adequate to safely complete daily activities?: Yes Is the patient deaf or have difficulty hearing?: No Does the patient have difficulty seeing, even when wearing glasses/contacts?: No Does the patient have difficulty concentrating, remembering, or making decisions?: No Patient able to express need for assistance with ADLs?: Yes Does the patient have difficulty dressing or bathing?: Yes Independently performs ADLs?: No (secondary to severe back pain-cannot walk) Communication: Independent Dressing (OT): Needs assistance Is this a change from baseline?: Change from baseline, expected to last >3 days Grooming: Needs assistance Is this a change from baseline?: Change from baseline, expected to last >3 days Feeding: Independent Bathing: Needs assistance Is this a change from baseline?: Change from baseline, expected to last >3 days Toileting: Dependent Is this a change from baseline?: Change from baseline, expected to last >3days In/Out Bed: Dependent Is this a change from baseline?: Change from baseline, expected to last >3 days Walks in Home: Dependent Is this a change from baseline?: Change from baseline, expected to last >3 days Does the patient have difficulty walking or  climbing stairs?: Yes (secondary to severe back pain) Weakness of Legs: Both Weakness of Arms/Hands: None  Permission Sought/Granted   Permission granted to share information with : Yes, Verbal Permission Granted        Permission granted to share info w Relationship:  Spouse     Emotional Assessment Appearance:: Appears stated age   Affect (typically observed): Accepting Orientation: : Oriented to Self,Oriented to Place,Oriented to  Time,Oriented to Situation Alcohol / Substance Use: Not Applicable Psych Involvement: No (comment)  Admission diagnosis:  Closed L5 vertebral fracture ([place]) [S32.059A] Other closed fracture of fifth lumbar vertebra, initial encounter [facility]) [S32.058A] Patient Active Problem List   Diagnosis Date Noted  . Fracture of L5 vertebra ([place]) 03/31/2020  . Intractable pain 03/31/2020  . Impaired ambulation 03/31/2020  . Closed L5 vertebral fracture ([place]) 03/31/2020  . Chronic kidney disease, stage 3 unspecified ([place]) 03/03/2020  . 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/2020  . Essential hypertension 03/03/2020  . Hardening of the aorta (main artery of the heart) ([place]) 03/03/2020  . Iron deficiency anemia 03/03/2020  . Multiple myeloma ([place]) 03/03/2020  . Obesity 03/03/2020  . Personal history of colonic polyps 03/03/2020  . Personal history of pulmonary embolism 03/03/2020  . Prediabetes 03/03/2020  . Primary gout 03/03/2020  . Pure hypercholesterolemia 03/03/2020  . 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[place]) 03/03/2020  . Thoracic aortic aneurysm without rupture ([place]) 03/03/2020  . Type 2 diabetes mellitus ([place]) 03/03/2020  . Vitamin D deficiency 03/03/2020  . Malignant tumor of kidney ([place]) 03/03/2020  . 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 Renal lesion 05/06/2016  . Delirium 05/05/2016  . Pulmonary embolus ([place]) 05/05/2016  . 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 Osteoarthritis of right knee 04/20/2016  . Rheumatoid arthritis involving right knee ([place]) 04/20/2016  . Inguinal hernia without mention of obstruction or gangrene, unilateral or unspecified, (not specified as recurrent)-left 08/10/2011   PCP:  [name], MD Pharmacy:   [id number] [id number]: [phone]: [phone]    Social Determinants of Health (SDOH) Interventions    Readmission Risk Interventions No flowsheet data found.

## 2020-04-02 NOTE — Telephone Encounter (Signed)
-----   Message from Wyatt Portela, MD sent at 04/02/2020 10:24 AM EST ----- Chemotherapy can be resumed next week.  Missing 1 week will not be critical.  The kidney cancer diagnosis is old and related to 2018 findings.  Thanks ----- Message ----- From: Tami Lin, RN Sent: 04/02/2020  10:22 AM EST To: Wyatt Portela, MD  Patient is currently admitted. Patient's wife called and wants to know if patient should come to treatment tomorrow if he is discharged today or tomorrow. I told her if he is feeling up to it then he should come but if not then he could resume next week. She wants your confirmation that that is the correct response. 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 She wants to confirm with you that this is from 2018 and not new. Lanelle Bal

## 2020-04-02 NOTE — Progress Notes (Signed)
Pt's wife called me back and we discussed copay assistance.  She gave me consent to apply in hisbehalf so I applied to LLS and he was approved for $13,000 toassist w/ins premiums and/orqualifying treatment expenseseffective1/13/22to1/13/23 w/ a 90 day look back period.  Pt is overqualified for the [employer].  They have my number for any questions or concerns they may have in the future.

## 2020-04-03 ENCOUNTER — Other Ambulatory Visit: Payer: Medicare HMO

## 2020-04-03 ENCOUNTER — Ambulatory Visit: Payer: Medicare HMO

## 2020-04-03 LAB — BASIC METABOLIC PANEL
Anion gap: 11 (ref 5–15)
BUN: 17 mg/dL (ref 8–23)
CO2: 24 mmol/L (ref 22–32)
Calcium: 8.6 mg/dL — ABNORMAL LOW (ref 8.9–10.3)
Chloride: 94 mmol/L — ABNORMAL LOW (ref 98–111)
Creatinine, Ser: 1.12 mg/dL (ref 0.61–1.24)
GFR, Estimated: >60 mL/min (ref >60)
Glucose, Bld: 112 mg/dL — ABNORMAL HIGH (ref 70–99)
Potassium: 3.9 mmol/L (ref 3.5–5.1)
Sodium: 129 mmol/L — ABNORMAL LOW (ref 135–145)

## 2020-04-03 LAB — MAGNESIUM: Magnesium: 2 mg/dL (ref 1.7–2.4)

## 2020-04-03 LAB — CBC
HCT: 36 % — ABNORMAL LOW (ref 39.0–52.0)
Hemoglobin: 12.1 g/dL — ABNORMAL LOW (ref 13.0–17.0)
MCH: 34.9 pg — ABNORMAL HIGH (ref 26.0–34.0)
MCHC: 33.6 g/dL (ref 30.0–36.0)
MCV: 103.7 fL — ABNORMAL HIGH (ref 80.0–100.0)
Platelets: 75 10*3/uL — ABNORMAL LOW (ref 150–400)
RBC: 3.47 MIL/uL — ABNORMAL LOW (ref 4.22–5.81)
RDW: 13.9 % (ref 11.5–15.5)
WBC: 4.4 10*3/uL (ref 4.0–10.5)
nRBC: 0 % (ref 0.0–0.2)

## 2020-04-03 MED ORDER — POLYETHYLENE GLYCOL 3350 17 G PO PACK: 17.0000 g | PACK | Freq: Every day | ORAL | 0 refills | Status: AC | PRN

## 2020-04-03 MED ORDER — SENNOSIDES-DOCUSATE SODIUM 8.6-50 MG PO TABS: 1.0000 | ORAL_TABLET | Freq: Every day | ORAL | 0 refills | Status: AC

## 2020-04-03 MED ORDER — LIDOCAINE 5 % EX PTCH: 1.0000 | MEDICATED_PATCH | Freq: Two times a day (BID) | CUTANEOUS | 0 refills | Status: AC

## 2020-04-03 NOTE — Progress Notes (Signed)
Patient and spouse was given discharge instructions, and all questions were answered. Patient was stable for discharge and was taken to the main exit by wheelchair. 

## 2020-04-03 NOTE — Progress Notes (Signed)
Occupational Therapy Treatment Patient Details Name: Devin [name] MRN: [id number] DOB: June 08, 1940 Today's Date: 04/03/2020    History of present illness Devin [name] is Devin 80 y.o. male with medical history significant for multiple myeloma, HTN, HLD, hx of PE, RA who presents by EMS for evaluation of pain and unable to walk.  He is being treated by oncology for multiple myeloma.CT scan showed known lesions at L5 with Devin vertical fracture through the anterior body, no retropulsion    Neurosurgery called by ER and recommended Devin LSO brace to be placed on patient when he gets out of bed.   OT comments  Treatment focused on improving patient's independence with self care tasks and educating on compensatory strategies. Patient able to don and doff socks and don pants by using figure four method.Patient able to don brace and perform functional mobility needed for toileting and shower transfer. Patient educated on use of AE if needed. Patient verbalized understanding.   Follow Up Recommendations  Home health OT;Supervision/Assistance - 24 hour    Equipment Recommendations  None recommended by OT    Recommendations for Other Services      Precautions / Restrictions Precautions Precautions: Fall;Back Required Braces or Orthoses: Spinal Brace Spinal Brace: Lumbar corset Restrictions Weight Bearing Restrictions: No       Mobility Bed Mobility Overal bed mobility: Needs Assistance Bed Mobility: Rolling;Sidelying to Sit Rolling: Supervision Sidelying to sit: Supervision       General bed mobility comments: verbal cues for log roll technique. Patient able to perform with increased time and use of bed rail.  Transfers Overall transfer level: Needs assistance Equipment used: Rolling walker (2 wheeled) Transfers: Sit to/from [facility] Sit to Stand: Supervision Stand pivot transfers: Supervision       General transfer comment: Ambulated in room with RW    Balance Overall  balance assessment: Mild deficits observed, not formally tested                                         ADL either performed or assessed with clinical judgement   ADL Overall ADL's : Needs assistance/impaired               Lower Body Bathing Details (indicate cue type and reason): Patient can reach lower legs using figure four method. However, educated patient he could purchase long handled sponge to extend reach and improve ease of task. Upper Body Dressing : Supervision/safety Upper Body Dressing Details (indicate cue type and reason): supervision - with verbal instruction to don brace   Lower Body Dressing Details (indicate cue type and reason): used figure four method to don and doff socks and dan pants, no physical assistance needed. Toilet Transfer: RW;Comfort height toilet;Grab bars;Ambulation Toilet Transfer Details (indicate cue type and reason): Patient has elevated toilet at home. Reports performing toilet transfer yesterday with nursing. Toileting- Clothing Manipulation and Hygiene: Modified independent Toileting - Clothing Manipulation Details (indicate cue type and reason): Reports performing toileting yesterday with nursiing - reports being able to perform pericare.   Tub/Shower Transfer Details (indicate cue type and reason): Patient has walk in shower and shower chair         Vision Baseline Vision/History: No visual deficits Patient Visual Report: No change from baseline Vision Assessment?: No apparent visual deficits   Perception     Praxis      Cognition Arousal/Alertness: Awake/alert Behavior  During Therapy: WFL for tasks assessed/performed Overall Cognitive Status: Within Functional Limits for tasks assessed                                          Exercises     Shoulder Instructions       General Comments      Pertinent Vitals/ Pain       Pain Assessment: 0-10 Pain Score: 6  Pain Location: low back Pain  Descriptors / Indicators: Grimacing;Guarding Pain Intervention(s): Limited activity within patient's tolerance;Monitored during session;Premedicated before session  Home Living                                          Prior Functioning/Environment              Frequency  Min 2X/week        Progress Toward Goals  OT Goals(current goals can now be found in the care plan section)  Progress towards OT goals: Progressing toward goals  Acute Rehab OT Goals Patient Stated Goal: wants to walk OT Goal Formulation: With patient Time For Goal Achievement: 04/15/20 Potential to Achieve Goals: Good  Plan Discharge plan needs to be updated    Co-evaluation                 AM-PAC OT "6 Clicks" Daily Activity     Outcome Measure   Help from another person eating meals?: None Help from another person taking care of personal grooming?: None Help from another person toileting, which includes using toliet, bedpan, or urinal?: Devin Little Help from another person bathing (including washing, rinsing, drying)?: Devin Little Help from another person to put on and taking off regular upper body clothing?: Devin Little Help from another person to put on and taking off regular lower body clothing?: Devin Little 6 Click Score: 20    End of Session Equipment Utilized During Treatment: Back brace;Rolling walker  OT Visit Diagnosis: Unsteadiness on feet (R26.81);Other abnormalities of gait and mobility (R26.89);Pain   Activity Tolerance Patient tolerated treatment well   Patient Left in chair;with call bell/phone within reach;with chair alarm set   Nurse Communication Mobility status        Time: 1638-4665 OT Time Calculation (min): 20 min  Charges: OT General Charges $OT Visit: 1 Visit OT Treatments $Self Care/Home Management : 8-22 mins  Devin [name], OTR/L [place]  Office [phone] Pager: [place] 04/03/2020, 12:43 PM

## 2020-04-03 NOTE — TOC Transition Note (Signed)
Transition of Care [facility]) - CM/SW Discharge Note   Patient Details  Name: Devin [name] MRN: [id number] Date of Birth: 1940/08/05  Transition of Care [facility]) CM/SW Contact:  Lia Hopping, [place] Phone Number: 04/03/2020, 10:25 AM   Clinical Narrative:    Patient and spouse have requested to proceed with home PT/OT/Nurse aide services. Spouse is concern about covid at SNF. Spouse reports the patient used [facility] in the past and is open to using their services again. Spouse wants to use an agency that will start care on Saturday and in network with the patient insurance.  CSW reached out [facility], the start of care is for Sunday weather permitting.  CSW reached out to [facility]. The agency confirm start of care for Saturday.  Patient confirm he has DME.    Final next level of care: [place] Barriers to Discharge: Barriers Resolved   Patient Goals and CMS Choice Patient states their goals for this hospitalization and ongoing recovery are:: return home CMS Medicare.gov Compare Post Acute Care list provided to:: Patient Choice offered to / list presented to : [facility]  Discharge Placement                Patient to be transferred to facility by: Private Vehicle Name of family member notified: [place], [place] [phone]  [phone] Patient and family notified of of transfer: 04/03/20  Discharge Plan and Services In-house Referral: Clinical Social Work Discharge Planning Services: CM Consult Post Acute Care Choice: Home Health                    HH Arranged: PT [place]: [place] Date [place]: 04/03/20 Time [place]: 70 Representative spoke with at [place]: Cindie  Social Determinants of Health ([place]) Interventions     Readmission Risk Interventions No flowsheet data found.

## 2020-04-03 NOTE — Discharge Summary (Signed)
Physician Discharge Summary  [name] EHO:[id number] DOB: 06-29-1940 DOA: 03/30/2020  PCP: [name], MD  Admit date: 03/30/2020 Discharge date: 04/03/2020  Admitted From: Home  Discharge disposition: Home  Recommendations for Outpatient Follow-Up:   . Follow up with your primary care provider in one week.  . Check CBC, BMP, magnesium in the next visit . Follow-up with oncologist as scheduled for treatment of multiple myeloma. . Patient has been prescribed LSO brace during ambulation.   Discharge Diagnosis:   Principal Problem:   Fracture of L5 vertebra (HCC) Active Problems:   Essential hypertension   Multiple myeloma (HCC)   Intractable pain   Impaired ambulation   Closed L5 vertebral fracture ([place])  Discharge Condition: Improved.  Diet recommendation: Low sodium, heart healthy  Wound care: None.  Code status: Full.   History of Present Illness:   Devin Scow Schaafis a 80 y.o.malewith medical history significant formultiple myeloma, HTN, HLD, hx of PE, RA whopresented hospital as brought by EMS for evaluation of ambulatory dysfunction and severe back pain.  Patient has history of rheumatoid arthritis and multiple myeloma currently receiving chemotherapy.  He stated that he did have a fall during chemotherapy and after which he had been having worsening pain and difficulty ambulating.  Patient was noted to have closed L5 vertebral fracture. Neurosurgery was called by ER and recommended a LSO brace to be placed on patient when he gets out of bed. He was not able to stand or walk in the ER.  Patient was then admitted to hospital for further evaluation and treatment.   Hospital Course:   Following conditions were addressed during hospitalization as listed below,  Fracture of the L5 vertebrawith intractable pain. LSO braceasper neurosurgery recommendation. Seen by PT OT, recommended skilled nursing facility placement but family wished Home health  on discharge. Patient will be continued on  morphine IR, morphine long-acting (chronic meds).  Lidoderm patch  has been added  Essential hypertension We will continue losartan, Norvasc and labetalol on discharge.  Multiple myeloma  Followed by oncologyas outpatient. Had received XRT in the past.  Continue chemotherapy as outpatient as per oncology.  Ambulatory dysfunction Patient will be arranged for home health PT on discharge but  Hyponatremia.  Asymptomatic. Could be secondary to paraproteinemia.    Will need outpatient monitoring.  Disposition.  At this time, patient is stable for disposition home with home health.  Medical Consultants:    None.  Procedures:    None. Subjective:   Today, patient was seen and examined at bedside.  Denies any nausea, vomiting fever chills or rigors.  Pain has improved  Discharge Exam:   Vitals:   04/02/20 2100 04/03/20 0557  BP: 105/77 112/71  Pulse: 79 68  Resp: 18 18  Temp: 98 F (36.7 C) 98.2 F (36.8 C)  SpO2: 96% 99%   Vitals:   04/02/20 0529 04/02/20 1335 04/02/20 2100 04/03/20 0557  BP: 105/73 120/79 105/77 112/71  Pulse: 69 68 79 68  Resp: 18 18 18 18   Temp: 98.7 F (37.1 C) 97.9 F (36.6 C) 98 F (36.7 C) 98.2 F (36.8 C)  TempSrc: Oral Oral Oral Oral  SpO2: 98% 99% 96% 99%  Weight:      Height:       General: Alert awake oriented, not in obvious distress, HENT:   No scleral pallor or icterus noted. Oral mucosa is moist.  Chest:  Clear breath sounds.  Diminished breath sounds bilaterally. No crackles or wheezes.  CVS: S1 &S2 heard. No murmur.  Regular rate and rhythm. Abdomen: Soft, nontender, nondistended.  Bowel sounds are heard.  Lumbar brace in place Extremities: No cyanosis, clubbing or edema.  Peripheral pulses are palpable. Psych: Alert, awake and oriented, normal mood CNS:  No cranial nerve deficits.  Moves all extremities Skin: Warm and dry.  No rashes noted.  The results of significant  diagnostics from this hospitalization (including imaging, microbiology, ancillary and laboratory) are listed below for reference.     Diagnostic Studies:   DG Thoracic Spine 2 View  Result Date: 03/30/2020 CLINICAL DATA:  Fall at chemo on Friday with thoracic back pain. EXAM: THORACIC SPINE 2 VIEWS COMPARISON:  Chest CT 05/28/2018. FINDINGS: Chronic T4 compression fracture is unchanged from prior chest CT. No evidence of new or compression deformity or acute fracture. Flowing anterior osteophytes throughout the thoracic spine. Mild multilevel disc space narrowing. No paravertebral soft tissue abnormality to suggest fracture. No radiographic evidence of focal lesion. IMPRESSION: 1. No acute fracture or subluxation of the thoracic spine. 2. Chronic T4 compression fracture. Multilevel degenerative disc disease and DISH. Electronically Signed   By: Keith Rake M.D.   On: 03/30/2020 18:55   DG Lumbar Spine Complete  Result Date: 03/30/2020 CLINICAL DATA:  Fall a chemotherapy on Friday with continued back pain. EXAM: LUMBAR SPINE - COMPLETE 4+ VIEW COMPARISON:  PET CT 02/03/2020.  Lumbar MRI 01/08/2020 FINDINGS: Lytic lesion within L5 vertebral body, chronic mild superior endplate flattening appears similar to prior imaging. No evidence of new or acute fracture. The remaining vertebral body heights are preserved. No obvious new focal lesion by radiograph. Known left iliac lesion is not well seen. IMPRESSION: 1. No evidence of acute fracture of the lumbar spine. 2. Lytic lesion in the L5 vertebral body with slight loss of height, grossly unchanged from recent PET/CT. No visualized new lumbar lesions. Electronically Signed   By: Keith Rake M.D.   On: 03/30/2020 18:53   CT Thoracic Spine Wo Contrast  Result Date: 03/30/2020 CLINICAL DATA:  Fall. Currently on chemotherapy. Renal cell carcinoma. EXAM: CT THORACIC AND LUMBAR SPINE WITHOUT CONTRAST TECHNIQUE: Multidetector CT imaging of the thoracic and  lumbar spine was performed without contrast. Multiplanar CT image reconstructions were also generated. COMPARISON:  None. FINDINGS: CT THORACIC SPINE FINDINGS Alignment: Normal Vertebrae: There is ankylosis from T3-T11. No lytic or blastic lesion. No acute fracture. Moderate height loss at T4 appears chronic. Paraspinal and other soft tissues: Calcific aortic atherosclerosis. Disc levels: Spinal canal patency is maintained. CT LUMBAR SPINE FINDINGS Segmentation: Standard Alignment: Normal Vertebrae: Mottled lytic lesions of L5 with vertically oriented fracture through the midportion of the vertebral body. Fracture also involves anterior wall. No retropulsion. Mild height loss. Otherwise, the bones are osteopenic without other focal lesion. Paraspinal and other soft tissues: Calcific atherosclerosis. Disc levels: No spinal canal stenosis. IMPRESSION: 1. Mottled lytic lesions of the L5 vertebral body with vertically oriented pathologic fracture through the midportion of the vertebral body and anterior wall, with mild height loss. No retropulsion or spinal canal stenosis. 2. No acute fracture or static subluxation of the thoracic or lumbar spine. 3. Moderate height loss at T4 appears chronic. 4. T3-T11 ankylosis. Aortic Atherosclerosis (ICD10-I70.0). Electronically Signed   By: Ulyses Jarred M.D.   On: 03/30/2020 23:02   CT Lumbar Spine Wo Contrast  Result Date: 03/30/2020 CLINICAL DATA:  Fall. Currently on chemotherapy. Renal cell carcinoma. EXAM: CT THORACIC AND LUMBAR SPINE WITHOUT CONTRAST TECHNIQUE: Multidetector CT imaging of  the thoracic and lumbar spine was performed without contrast. Multiplanar CT image reconstructions were also generated. COMPARISON:  None. FINDINGS: CT THORACIC SPINE FINDINGS Alignment: Normal Vertebrae: There is ankylosis from T3-T11. No lytic or blastic lesion. No acute fracture. Moderate height loss at T4 appears chronic. Paraspinal and other soft tissues: Calcific aortic  atherosclerosis. Disc levels: Spinal canal patency is maintained. CT LUMBAR SPINE FINDINGS Segmentation: Standard Alignment: Normal Vertebrae: Mottled lytic lesions of L5 with vertically oriented fracture through the midportion of the vertebral body. Fracture also involves anterior wall. No retropulsion. Mild height loss. Otherwise, the bones are osteopenic without other focal lesion. Paraspinal and other soft tissues: Calcific atherosclerosis. Disc levels: No spinal canal stenosis. IMPRESSION: 1. Mottled lytic lesions of the L5 vertebral body with vertically oriented pathologic fracture through the midportion of the vertebral body and anterior wall, with mild height loss. No retropulsion or spinal canal stenosis. 2. No acute fracture or static subluxation of the thoracic or lumbar spine. 3. Moderate height loss at T4 appears chronic. 4. T3-T11 ankylosis. Aortic Atherosclerosis (ICD10-I70.0). Electronically Signed   By: Ulyses Jarred M.D.   On: 03/30/2020 23:02   CT PELVIS WO CONTRAST  Result Date: 03/30/2020 CLINICAL DATA:  Pelvic trauma. Fall it chemo on Friday with continued pain. Known osseous metastatic disease. EXAM: CT PELVIS WITHOUT CONTRAST TECHNIQUE: Multidetector CT imaging of the pelvis was performed following the standard protocol without intravenous contrast. COMPARISON:  PET CT 02/03/2020, lumbar MRI 01/08/2020 FINDINGS: Urinary Tract: Decompressed distal ureters. Mass effect on the urinary bladder from enlarged prostate gland, no definite bladder wall thickening. Bowel: No acute bowel inflammation. Moderate stool in the included colon. There is stool distending the rectum. Vascular/Lymphatic: Dense aorto bi-iliac atherosclerosis. Iliac vessels are tortuous. No enlarged pelvic lymph nodes. 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 Other: Prior right inguinal hernia repair with tacks. There is minimal fat in the left inguinal canal. No  pelvic free fluid. Musculoskeletal: Known L5 lytic with pathologic fracture. The small sacral metastatic lesions are not well seen by CT. No evidence of pelvic fracture, detailed assessment limited by osseous demineralization. Left iliac low-density lesion measures fat and is consistent with a benign intraosseous lipoma. Scattered small scerlotic foci were not hypermetabolic on prior PET. 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 There is no confluent soft tissue hematoma. IMPRESSION: 1. No evidence of acute pelvic fracture. 2. Known L5 lytic with pathologic fracture, assessed on concurrent lumbar spine CT. The small sacral metastatic lesions on prior PET are not well seen by CT. 3. 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 Aortic Atherosclerosis (ICD10-I70.0). Electronically Signed   By: Keith Rake M.D.   On: 03/30/2020 22:56     Labs:   Basic Metabolic Panel: Recent Labs  Lab 03/27/20 1358 03/30/20 1959 03/31/20 0455 04/02/20 0555 04/03/20 0529  NA 129* 130* 129* 127* 129*  K 4.4 4.1 4.1 4.0 3.9  CL 97* 95* 94* 94* 94*  CO2 22 26 24 24 24   GLUCOSE 178* 147* 133* 151* 112*  BUN 30* 23 22 16 17   CREATININE 1.22 1.08 1.20 1.08 1.12  CALCIUM 8.7* 8.4* 8.4* 8.7* 8.6*  MG  --   --   --   --  2.0   GFR Estimated Creatinine Clearance: 56.1 mL/min (by C-G formula based on SCr of 1.12 mg/dL). Liver Function Tests: Recent Labs  Lab 03/27/20 1358  AST 15  ALT 23  ALKPHOS 75  BILITOT 0.9  PROT 6.8  ALBUMIN 3.4*   No results for input(s): LIPASE, AMYLASE in the last 168 hours. No results for input(s): AMMONIA in the last 168 hours. Coagulation profile No results for input(s): INR, PROTIME in the last 168 hours.  CBC: Recent Labs  Lab 03/27/20 1358 03/30/20 1959 03/31/20 0455 04/02/20 0555 04/03/20 0529  WBC 11.8* 5.8 5.3 4.9 4.4  NEUTROABS 11.0* 4.7  --   --   --   HGB 12.3* 13.0 12.6* 12.0* 12.1*  HCT 35.4*  37.3* 36.9* 34.3* 36.0*  MCV 99.7 100.8* 101.7* 100.0 103.7*  PLT 119* 85* 82* 74* 75*   Cardiac Enzymes: No results for input(s): CKTOTAL, CKMB, CKMBINDEX, TROPONINI in the last 168 hours. BNP: Invalid input(s): POCBNP CBG: No results for input(s): GLUCAP in the last 168 hours. D-Dimer No results for input(s): DDIMER in the last 72 hours. Hgb A1c No results for input(s): HGBA1C in the last 72 hours. Lipid Profile No results for input(s): CHOL, HDL, LDLCALC, TRIG, CHOLHDL, LDLDIRECT in the last 72 hours. Thyroid function studies No results for input(s): TSH, T4TOTAL, T3FREE, THYROIDAB in the last 72 hours.  Invalid input(s): FREET3 Anemia work up No results for input(s): VITAMINB12, FOLATE, FERRITIN, TIBC, IRON, RETICCTPCT in the last 72 hours. Microbiology Recent Results (from the past 240 hour(s))  SARS CORONAVIRUS 2 (TAT 6-24 HRS) Nasopharyngeal Nasopharyngeal Swab     Status: None   Collection Time: 03/31/20 12:12 AM   Specimen: Nasopharyngeal Swab  Result Value Ref Range Status   SARS Coronavirus 2 NEGATIVE NEGATIVE Final    Comment: (NOTE) SARS-CoV-2 target nucleic acids are NOT DETECTED.  The SARS-CoV-2 RNA is generally detectable in upper and lower respiratory specimens during the acute phase of infection. Negative results do not preclude SARS-CoV-2 infection, do not rule out co-infections with other pathogens, and should not be used as the sole basis for treatment or other patient management decisions. Negative results must be combined with clinical observations, patient history, and epidemiological information. The expected result is Negative.  Fact Sheet for Patients: SugarRoll.be  Fact Sheet for Healthcare Providers: https://www.woods-mathews.com/  This test is not yet approved or cleared by the [place] FDA and  has been authorized for detection and/or diagnosis of SARS-CoV-2 by FDA under an Emergency Use  Authorization (EUA). This EUA will remain  in effect (meaning this test can be used) for the duration of the COVID-19 declaration under Se ction 564(b)(1) of the Act, 21 U.S.C. section 360bbb-3(b)(1), unless the authorization is terminated or revoked sooner.  Performed at [facility], [place] [address] [place], [place] [address]      Discharge Instructions:   Discharge Instructions    Diet - low sodium heart healthy   Complete by: As directed    Discharge instructions   Complete by: As directed    Follow-up with your primary care physician in 1 week.  Follow-up with oncology as scheduled by you.  Continue to use brace when out of bed and physical therapy at home.   Increase activity slowly   Complete by: As directed      Allergies as of 04/03/2020      Reactions   Amlodipine Swelling   Pravastatin Other (See Comments)   Joint pain   Zestril [lisinopril] Other (See Comments)   Gout   ( Zestril) gout   Oxycodone Other (See Comments)   Causes agitation       Medication List  TAKE these medications   acetaminophen 500 MG tablet Commonly known as: TYLENOL Take 1,000 mg by mouth daily as needed for mild pain or moderate pain.   acyclovir 400 MG tablet Commonly known as: ZOVIRAX Take 1 tablet (400 mg total) by mouth daily.   allopurinol 300 MG tablet Commonly known as: ZYLOPRIM Take 300 mg by mouth daily.   amLODipine 10 MG tablet Commonly known as: NORVASC Take 10 mg by mouth daily.   Aspirin Low Dose 81 MG EC tablet Generic drug: aspirin Take 81 mg by mouth daily.   dexamethasone 4 MG tablet Commonly known as: DECADRON Take 5 tablets weekly on the day of cancer treatment.   folic acid 1 MG tablet Commonly known as: FOLVITE Take 1 mg by mouth daily.   furosemide 20 MG tablet Commonly known as: LASIX Take 20 mg by mouth 2 (two) times daily.   labetalol 300 MG tablet Commonly known as: NORMODYNE Take 300 mg by mouth 2 (two) times daily.    lidocaine 5 % Commonly known as: Lidoderm Place 1 patch onto the skin every 12 (twelve) hours. Remove & Discard patch within 12 hours or as directed by MD   losartan 100 MG tablet Commonly known as: COZAAR Take 100 mg by mouth daily.   morphine 15 MG tablet Commonly known as: MSIR Take 1 tablet (15 mg total) by mouth every 4 (four) hours as needed for severe pain.   morphine 15 MG 12 hr tablet Commonly known as: MS CONTIN Take 1 tablet (15 mg total) by mouth every 12 (twelve) hours.   Narcan 4 MG/0.1ML Liqd nasal spray kit Generic drug: naloxone Place 1 spray into the nose once.   polyethylene glycol 17 g packet Commonly known as: MIRALAX / GLYCOLAX Take 17 g by mouth daily as needed for moderate constipation.   rosuvastatin 5 MG tablet Commonly known as: CRESTOR Take 5 mg by mouth daily.   senna-docusate 8.6-50 MG tablet Commonly known as: Senokot-S Take 1 tablet by mouth at bedtime.   Vitamin D (Ergocalciferol) 1.25 MG (50000 UNIT) Caps capsule Commonly known as: DRISDOL Take 50,000 Units by mouth every 14 (fourteen) days.       Follow-up Information    [name], MD. Schedule an appointment as soon as possible for a visit in 1 week(s).   Specialty: Family Medicine Contact information: [place] [address] [place], [facility] Follow up.   Specialty: [place] Why: This agency will provide home physical therapy, occupational therapy and nurse aide services. The agency will contact you prior to the first visit.  Contact information: [place] [address] [phone]                Time coordinating discharge: 39 minutes  Signed:  Rhina Kramme  [facility] 04/03/2020, 10:36 AM

## 2020-04-04 ENCOUNTER — Other Ambulatory Visit: Payer: Self-pay | Admitting: Hematology

## 2020-04-04 DIAGNOSIS — C9000 Multiple myeloma not having achieved remission: Secondary | ICD-10-CM | POA: Diagnosis not present

## 2020-04-04 DIAGNOSIS — I119 Hypertensive heart disease without heart failure: Secondary | ICD-10-CM | POA: Diagnosis not present

## 2020-04-04 DIAGNOSIS — M1990 Unspecified osteoarthritis, unspecified site: Secondary | ICD-10-CM | POA: Diagnosis not present

## 2020-04-04 DIAGNOSIS — E785 Hyperlipidemia, unspecified: Secondary | ICD-10-CM | POA: Diagnosis not present

## 2020-04-04 DIAGNOSIS — C649 Malignant neoplasm of unspecified kidney, except renal pelvis: Secondary | ICD-10-CM | POA: Diagnosis not present

## 2020-04-04 DIAGNOSIS — M8448XD Pathological fracture, other site, subsequent encounter for fracture with routine healing: Secondary | ICD-10-CM | POA: Diagnosis not present

## 2020-04-04 DIAGNOSIS — M069 Rheumatoid arthritis, unspecified: Secondary | ICD-10-CM | POA: Diagnosis not present

## 2020-04-04 DIAGNOSIS — M109 Gout, unspecified: Secondary | ICD-10-CM | POA: Diagnosis not present

## 2020-04-04 DIAGNOSIS — I700 Atherosclerosis of aorta: Secondary | ICD-10-CM | POA: Diagnosis not present

## 2020-04-04 MED ORDER — MORPHINE SULFATE 15 MG PO TABS: 15.0000 mg | ORAL_TABLET | ORAL | 0 refills | Status: DC | PRN

## 2020-04-06 ENCOUNTER — Telehealth: Payer: Self-pay | Admitting: *Deleted

## 2020-04-06 NOTE — Telephone Encounter (Signed)
Noted. I will refill

## 2020-04-06 NOTE — Telephone Encounter (Signed)
Received Telephone Advice fax from after hours [facility]. Fax dated 04/04/20 @ 1222PM - call time 04/04/20 @ 1205PM: Ms Mcewan called - patient just d/c'd from hospital yesterday - for a fa.. Fracture noted to L5 and back pain has been present. Last chemo - last Friday.  She stated husband has Morphine prescribed - 12 hr and 4 hr. The caller wanted Dr. Alen Blew to refill the 4 hr Morphine.  [facility] paged on call MD  - Oncall MD gave Rx for 2 weeks supply of 4 hr Morphine and asked that wife be informed to discuss next refill with Dr. Alen Blew at patient's next appt

## 2020-04-07 ENCOUNTER — Telehealth: Payer: Self-pay

## 2020-04-07 DIAGNOSIS — M069 Rheumatoid arthritis, unspecified: Secondary | ICD-10-CM | POA: Diagnosis not present

## 2020-04-07 DIAGNOSIS — I700 Atherosclerosis of aorta: Secondary | ICD-10-CM | POA: Diagnosis not present

## 2020-04-07 DIAGNOSIS — C9000 Multiple myeloma not having achieved remission: Secondary | ICD-10-CM | POA: Diagnosis not present

## 2020-04-07 DIAGNOSIS — E785 Hyperlipidemia, unspecified: Secondary | ICD-10-CM | POA: Diagnosis not present

## 2020-04-07 DIAGNOSIS — M1990 Unspecified osteoarthritis, unspecified site: Secondary | ICD-10-CM | POA: Diagnosis not present

## 2020-04-07 DIAGNOSIS — M109 Gout, unspecified: Secondary | ICD-10-CM | POA: Diagnosis not present

## 2020-04-07 DIAGNOSIS — M8448XD Pathological fracture, other site, subsequent encounter for fracture with routine healing: Secondary | ICD-10-CM | POA: Diagnosis not present

## 2020-04-07 DIAGNOSIS — I119 Hypertensive heart disease without heart failure: Secondary | ICD-10-CM | POA: Diagnosis not present

## 2020-04-07 DIAGNOSIS — C649 Malignant neoplasm of unspecified kidney, except renal pelvis: Secondary | ICD-10-CM | POA: Diagnosis not present

## 2020-04-07 NOTE — Telephone Encounter (Signed)
Patient's wife contacted office stating she is concerned about possible bad weather on Friday 04/10/20 and would like to reschedule patient's appointments.  Contacted Infusion Charge RN to see if we could accommodate and have patient come in earlier this week.  Per Agricultural consultant, ok for patient to come in Thursday at 2pm for lab and infusion. Patient's wife notified of appointment date and time and was agreeable to the new date and time.  Charge RN to make appointment changes in system.

## 2020-04-08 DIAGNOSIS — M1990 Unspecified osteoarthritis, unspecified site: Secondary | ICD-10-CM | POA: Diagnosis not present

## 2020-04-08 DIAGNOSIS — E785 Hyperlipidemia, unspecified: Secondary | ICD-10-CM | POA: Diagnosis not present

## 2020-04-08 DIAGNOSIS — M109 Gout, unspecified: Secondary | ICD-10-CM | POA: Diagnosis not present

## 2020-04-08 DIAGNOSIS — M8448XD Pathological fracture, other site, subsequent encounter for fracture with routine healing: Secondary | ICD-10-CM | POA: Diagnosis not present

## 2020-04-08 DIAGNOSIS — C9000 Multiple myeloma not having achieved remission: Secondary | ICD-10-CM | POA: Diagnosis not present

## 2020-04-08 DIAGNOSIS — I119 Hypertensive heart disease without heart failure: Secondary | ICD-10-CM | POA: Diagnosis not present

## 2020-04-08 DIAGNOSIS — I700 Atherosclerosis of aorta: Secondary | ICD-10-CM | POA: Diagnosis not present

## 2020-04-08 DIAGNOSIS — C649 Malignant neoplasm of unspecified kidney, except renal pelvis: Secondary | ICD-10-CM | POA: Diagnosis not present

## 2020-04-08 DIAGNOSIS — M069 Rheumatoid arthritis, unspecified: Secondary | ICD-10-CM | POA: Diagnosis not present

## 2020-04-09 ENCOUNTER — Inpatient Hospital Stay: Payer: Medicare HMO

## 2020-04-09 ENCOUNTER — Other Ambulatory Visit: Payer: Self-pay

## 2020-04-09 ENCOUNTER — Other Ambulatory Visit: Payer: Self-pay | Admitting: Oncology

## 2020-04-09 VITALS — BP 132/74 | HR 103 | Temp 98.6°F | Resp 20 | Ht 70.0 in | Wt 148.4 lb

## 2020-04-09 DIAGNOSIS — C9000 Multiple myeloma not having achieved remission: Secondary | ICD-10-CM | POA: Diagnosis not present

## 2020-04-09 DIAGNOSIS — Z5111 Encounter for antineoplastic chemotherapy: Secondary | ICD-10-CM | POA: Diagnosis not present

## 2020-04-09 LAB — CMP (CANCER CENTER ONLY)
ALT: 25 U/L (ref 0–44)
AST: 16 U/L (ref 15–41)
Albumin: 3 g/dL — ABNORMAL LOW (ref 3.5–5.0)
Alkaline Phosphatase: 115 U/L (ref 38–126)
Anion gap: 10 (ref 5–15)
BUN: 13 mg/dL (ref 8–23)
CO2: 20 mmol/L — ABNORMAL LOW (ref 22–32)
Calcium: 8.8 mg/dL — ABNORMAL LOW (ref 8.9–10.3)
Chloride: 96 mmol/L — ABNORMAL LOW (ref 98–111)
Creatinine: 1.1 mg/dL (ref 0.61–1.24)
GFR, Estimated: >60 mL/min (ref >60)
Glucose, Bld: 235 mg/dL — ABNORMAL HIGH (ref 70–99)
Potassium: 4.5 mmol/L (ref 3.5–5.1)
Sodium: 126 mmol/L — ABNORMAL LOW (ref 135–145)
Total Bilirubin: 0.8 mg/dL (ref 0.3–1.2)
Total Protein: 7.1 g/dL (ref 6.5–8.1)

## 2020-04-09 LAB — CBC WITH DIFFERENTIAL (CANCER CENTER ONLY)
Abs Immature Granulocytes: 0.05 10*3/uL (ref 0.00–0.07)
Basophils Absolute: 0 10*3/uL (ref 0.0–0.1)
Basophils Relative: 0 %
Eosinophils Absolute: 0 10*3/uL (ref 0.0–0.5)
Eosinophils Relative: 0 %
HCT: 31 % — ABNORMAL LOW (ref 39.0–52.0)
Hemoglobin: 10.9 g/dL — ABNORMAL LOW (ref 13.0–17.0)
Immature Granulocytes: 1 %
Lymphocytes Relative: 10 %
Lymphs Abs: 0.5 10*3/uL — ABNORMAL LOW (ref 0.7–4.0)
MCH: 34.7 pg — ABNORMAL HIGH (ref 26.0–34.0)
MCHC: 35.2 g/dL (ref 30.0–36.0)
MCV: 98.7 fL (ref 80.0–100.0)
Monocytes Absolute: 0.1 10*3/uL (ref 0.1–1.0)
Monocytes Relative: 2 %
Neutro Abs: 3.9 10*3/uL (ref 1.7–7.7)
Neutrophils Relative %: 87 %
Platelet Count: 125 10*3/uL — ABNORMAL LOW (ref 150–400)
RBC: 3.14 MIL/uL — ABNORMAL LOW (ref 4.22–5.81)
RDW: 13.6 % (ref 11.5–15.5)
WBC Count: 4.5 10*3/uL (ref 4.0–10.5)
nRBC: 0 % (ref 0.0–0.2)

## 2020-04-09 MED ORDER — PROCHLORPERAZINE MALEATE 10 MG PO TABS
ORAL_TABLET | ORAL | Status: AC
  Filled 2020-04-09: qty 1

## 2020-04-09 MED ORDER — PROCHLORPERAZINE MALEATE 10 MG PO TABS
10.0000 mg | ORAL_TABLET | Freq: Once | ORAL | Status: AC
  Administered 2020-04-09: 10 mg via ORAL

## 2020-04-09 MED ORDER — SODIUM CHLORIDE 0.9 % IV SOLN
Freq: Once | INTRAVENOUS | Status: AC
  Filled 2020-04-09: qty 250

## 2020-04-09 MED ORDER — BORTEZOMIB CHEMO IV INJECTION 3.5 MG (1MG/ML-GENERIC)
1.0000 mg/m2 | Freq: Once | INTRAVENOUS | Status: AC
  Administered 2020-04-09: 2 mg via INTRAVENOUS
  Filled 2020-04-09: qty 2

## 2020-04-09 NOTE — Patient Instructions (Signed)
[facility] Discharge Instructions for Patients Receiving Chemotherapy  Today you received the following chemotherapy agents: Bortezomib (Velcade)  To help prevent nausea and vomiting after your treatment, we encourage you to take your nausea medication  as prescribed.    If you develop nausea and vomiting that is not controlled by your nausea medication, call the clinic.   BELOW ARE SYMPTOMS THAT SHOULD BE REPORTED IMMEDIATELY:  *FEVER GREATER THAN 100.5 F  *CHILLS WITH OR WITHOUT FEVER  NAUSEA AND VOMITING THAT IS NOT CONTROLLED WITH YOUR NAUSEA MEDICATION  *UNUSUAL SHORTNESS OF BREATH  *UNUSUAL BRUISING OR BLEEDING  TENDERNESS IN MOUTH AND THROAT WITH OR WITHOUT PRESENCE OF ULCERS  *URINARY PROBLEMS  *BOWEL PROBLEMS  UNUSUAL RASH Items with * indicate a potential emergency and should be followed up as soon as possible.  Feel free to call the clinic should you have any questions or concerns. The clinic phone number is [phone].  Please show the CHEMO ALERT CARD at check-in to the Emergency Department and triage nurse.   

## 2020-04-10 ENCOUNTER — Ambulatory Visit: Payer: Medicare HMO

## 2020-04-10 ENCOUNTER — Other Ambulatory Visit: Payer: Medicare HMO

## 2020-04-10 ENCOUNTER — Telehealth: Payer: Self-pay | Admitting: *Deleted

## 2020-04-10 DIAGNOSIS — E43 Unspecified severe protein-calorie malnutrition: Secondary | ICD-10-CM | POA: Diagnosis not present

## 2020-04-10 DIAGNOSIS — C9000 Multiple myeloma not having achieved remission: Secondary | ICD-10-CM | POA: Diagnosis not present

## 2020-04-10 DIAGNOSIS — I10 Essential (primary) hypertension: Secondary | ICD-10-CM | POA: Diagnosis not present

## 2020-04-13 DIAGNOSIS — I700 Atherosclerosis of aorta: Secondary | ICD-10-CM | POA: Diagnosis not present

## 2020-04-13 DIAGNOSIS — M109 Gout, unspecified: Secondary | ICD-10-CM | POA: Diagnosis not present

## 2020-04-13 DIAGNOSIS — M1990 Unspecified osteoarthritis, unspecified site: Secondary | ICD-10-CM | POA: Diagnosis not present

## 2020-04-13 DIAGNOSIS — M8448XD Pathological fracture, other site, subsequent encounter for fracture with routine healing: Secondary | ICD-10-CM | POA: Diagnosis not present

## 2020-04-13 DIAGNOSIS — M069 Rheumatoid arthritis, unspecified: Secondary | ICD-10-CM | POA: Diagnosis not present

## 2020-04-13 DIAGNOSIS — I119 Hypertensive heart disease without heart failure: Secondary | ICD-10-CM | POA: Diagnosis not present

## 2020-04-13 DIAGNOSIS — C649 Malignant neoplasm of unspecified kidney, except renal pelvis: Secondary | ICD-10-CM | POA: Diagnosis not present

## 2020-04-13 DIAGNOSIS — C9000 Multiple myeloma not having achieved remission: Secondary | ICD-10-CM | POA: Diagnosis not present

## 2020-04-13 DIAGNOSIS — E785 Hyperlipidemia, unspecified: Secondary | ICD-10-CM | POA: Diagnosis not present

## 2020-04-13 NOTE — Progress Notes (Signed)
  Radiation Oncology         [phone] ________________________________  Name: Devin [name] MRN: [id number]  Date: 03/24/2020  DOB: 10-06-1940  End of Treatment Note  Diagnosis:   80 y.o. patient with multiple myeloma and painful sites of involvement at L5, S1 and S2   Indication for treatment:  Palliation       Radiation treatment dates:  03/05/20-03/24/20  Site/dose:    Spine L4-S3 treated to 20 Gy in 10 fractions  Beams/energy:   One static gantry 180 degree field and one dynamic conformal arc from 270-90 degrees to conformally deliver 6 MV X-rays  Narrative: The patient tolerated radiation treatment relatively well.   His pain improved.  Plan: The patient has completed radiation treatment. The patient will return to radiation oncology clinic for routine followup in one month. I advised him to call or return sooner if he has any questions or concerns related to his recovery or treatment. ________________________________  Sheral Apley. Tammi Klippel, M.D.

## 2020-04-15 ENCOUNTER — Telehealth: Payer: Self-pay

## 2020-04-15 ENCOUNTER — Other Ambulatory Visit: Payer: Self-pay

## 2020-04-15 ENCOUNTER — Encounter: Payer: Self-pay | Admitting: Urology

## 2020-04-15 DIAGNOSIS — M109 Gout, unspecified: Secondary | ICD-10-CM | POA: Diagnosis not present

## 2020-04-15 DIAGNOSIS — E785 Hyperlipidemia, unspecified: Secondary | ICD-10-CM | POA: Diagnosis not present

## 2020-04-15 DIAGNOSIS — I119 Hypertensive heart disease without heart failure: Secondary | ICD-10-CM | POA: Diagnosis not present

## 2020-04-15 DIAGNOSIS — C9000 Multiple myeloma not having achieved remission: Secondary | ICD-10-CM | POA: Diagnosis not present

## 2020-04-15 DIAGNOSIS — I700 Atherosclerosis of aorta: Secondary | ICD-10-CM | POA: Diagnosis not present

## 2020-04-15 DIAGNOSIS — M8448XD Pathological fracture, other site, subsequent encounter for fracture with routine healing: Secondary | ICD-10-CM | POA: Diagnosis not present

## 2020-04-15 DIAGNOSIS — M069 Rheumatoid arthritis, unspecified: Secondary | ICD-10-CM | POA: Diagnosis not present

## 2020-04-15 DIAGNOSIS — M1990 Unspecified osteoarthritis, unspecified site: Secondary | ICD-10-CM | POA: Diagnosis not present

## 2020-04-15 DIAGNOSIS — C649 Malignant neoplasm of unspecified kidney, except renal pelvis: Secondary | ICD-10-CM | POA: Diagnosis not present

## 2020-04-15 NOTE — Telephone Encounter (Signed)
Spoke with patient's wife in regards to telephone appointment with Freeman Caldron PA on 04/29/20 @ 10:00am. Wife verbalized understanding of appointment date and time. Reviewed meaningful use questions. TM

## 2020-04-17 ENCOUNTER — Inpatient Hospital Stay: Payer: Medicare HMO

## 2020-04-17 ENCOUNTER — Inpatient Hospital Stay (HOSPITAL_BASED_OUTPATIENT_CLINIC_OR_DEPARTMENT_OTHER): Payer: Medicare HMO | Admitting: Oncology

## 2020-04-17 ENCOUNTER — Other Ambulatory Visit: Payer: Self-pay

## 2020-04-17 VITALS — BP 136/82 | HR 78 | Temp 97.7°F | Resp 18

## 2020-04-17 DIAGNOSIS — S32050S Wedge compression fracture of fifth lumbar vertebra, sequela: Secondary | ICD-10-CM

## 2020-04-17 DIAGNOSIS — C419 Malignant neoplasm of bone and articular cartilage, unspecified: Secondary | ICD-10-CM

## 2020-04-17 DIAGNOSIS — C9000 Multiple myeloma not having achieved remission: Secondary | ICD-10-CM

## 2020-04-17 DIAGNOSIS — Z5111 Encounter for antineoplastic chemotherapy: Secondary | ICD-10-CM | POA: Diagnosis not present

## 2020-04-17 LAB — CMP (CANCER CENTER ONLY)
ALT: 18 U/L (ref 0–44)
AST: 12 U/L — ABNORMAL LOW (ref 15–41)
Albumin: 3.1 g/dL — ABNORMAL LOW (ref 3.5–5.0)
Alkaline Phosphatase: 90 U/L (ref 38–126)
Anion gap: 9 (ref 5–15)
BUN: 13 mg/dL (ref 8–23)
CO2: 23 mmol/L (ref 22–32)
Calcium: 8.8 mg/dL — ABNORMAL LOW (ref 8.9–10.3)
Chloride: 100 mmol/L (ref 98–111)
Creatinine: 1.06 mg/dL (ref 0.61–1.24)
GFR, Estimated: >60 mL/min (ref >60)
Glucose, Bld: 159 mg/dL — ABNORMAL HIGH (ref 70–99)
Potassium: 4.2 mmol/L (ref 3.5–5.1)
Sodium: 132 mmol/L — ABNORMAL LOW (ref 135–145)
Total Bilirubin: 0.4 mg/dL (ref 0.3–1.2)
Total Protein: 6.8 g/dL (ref 6.5–8.1)

## 2020-04-17 LAB — CBC WITH DIFFERENTIAL (CANCER CENTER ONLY)
Abs Immature Granulocytes: 0.08 10*3/uL — ABNORMAL HIGH (ref 0.00–0.07)
Basophils Absolute: 0 10*3/uL (ref 0.0–0.1)
Basophils Relative: 1 %
Eosinophils Absolute: 0.1 10*3/uL (ref 0.0–0.5)
Eosinophils Relative: 1 %
HCT: 30.7 % — ABNORMAL LOW (ref 39.0–52.0)
Hemoglobin: 10.7 g/dL — ABNORMAL LOW (ref 13.0–17.0)
Immature Granulocytes: 1 %
Lymphocytes Relative: 14 %
Lymphs Abs: 0.9 10*3/uL (ref 0.7–4.0)
MCH: 34.9 pg — ABNORMAL HIGH (ref 26.0–34.0)
MCHC: 34.9 g/dL (ref 30.0–36.0)
MCV: 100 fL (ref 80.0–100.0)
Monocytes Absolute: 0.3 10*3/uL (ref 0.1–1.0)
Monocytes Relative: 5 %
Neutro Abs: 4.9 10*3/uL (ref 1.7–7.7)
Neutrophils Relative %: 78 %
Platelet Count: 190 10*3/uL (ref 150–400)
RBC: 3.07 MIL/uL — ABNORMAL LOW (ref 4.22–5.81)
RDW: 14.2 % (ref 11.5–15.5)
WBC Count: 6.2 10*3/uL (ref 4.0–10.5)
nRBC: 0 % (ref 0.0–0.2)

## 2020-04-17 MED ORDER — MORPHINE SULFATE ER 30 MG PO TBCR
30.0000 mg | EXTENDED_RELEASE_TABLET | Freq: Two times a day (BID) | ORAL | 0 refills | Status: DC
Start: 2020-04-17 — End: 2020-06-05

## 2020-04-17 MED ORDER — MORPHINE SULFATE 15 MG PO TABS: 15.0000 mg | ORAL_TABLET | ORAL | 0 refills | Status: DC | PRN

## 2020-04-17 MED ORDER — SODIUM CHLORIDE 0.9 % IV SOLN
Freq: Once | INTRAVENOUS | Status: AC
  Filled 2020-04-17: qty 250

## 2020-04-17 MED ORDER — PROCHLORPERAZINE MALEATE 10 MG PO TABS
ORAL_TABLET | ORAL | Status: AC
  Filled 2020-04-17: qty 1

## 2020-04-17 MED ORDER — CALCIUM CARBONATE-VITAMIN D 500-200 MG-UNIT PO TABS: 1.0000 | ORAL_TABLET | Freq: Two times a day (BID) | ORAL | 3 refills | Status: AC

## 2020-04-17 MED ORDER — BORTEZOMIB CHEMO IV INJECTION 3.5 MG (1MG/ML-GENERIC)
1.0000 mg/m2 | Freq: Once | INTRAVENOUS | Status: AC
  Administered 2020-04-17: 2 mg via INTRAVENOUS
  Filled 2020-04-17: qty 2

## 2020-04-17 MED ORDER — PROCHLORPERAZINE MALEATE 10 MG PO TABS
10.0000 mg | ORAL_TABLET | Freq: Once | ORAL | Status: AC
  Administered 2020-04-17: 10 mg via ORAL

## 2020-04-17 NOTE — Progress Notes (Signed)
Hematology and Oncology Follow Up Visit  Devin [name] [id number] [birth year] 80 y.o. 04/17/2020 12:02 PM Devin [name], MDTimberlake, Devin [name], *   Principle Diagnosis: 80 year old man with IgA multiple myeloma presented with IgA level 2579, M spike of 2.5 g/dL in November 2021.  Bone marrow biopsy showed 50% plasma cell involvement with 13 q. 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   Prior Therapy:   He status post right robotic laparoscopic partial nephrectomy on October 03, 2016.  Palliative radiation therapy to the spine.  He completed 20 Gy in 10 fractions on March 24, 2020.  Current therapy:   Velcade and dexamethasone started on January 7 to be given weekly.  He received 2 injections and here for the next cycle of therapy.   Interim History: Devin [name] returns today for repeat follow-up.  Since the last visit, he was hospitalized between January 10 of April 03, 2020 for compression fracture of L5 vertebral body.  Since his discharge, he has participated in physical therapy without any significant improvement in his pain.  He does wear a brace and ambulate with the help of a walker.  He still has chronic pain issues predominantly lower back and lower extremities.  He is eating better but lost close to 20 pounds since his diagnosis.    Medications: Reviewed and updated. Current Outpatient Medications  Medication Sig Dispense Refill  . acetaminophen (TYLENOL) 500 MG tablet Take 1,000 mg by mouth daily as needed for mild pain or moderate pain.    Marland Kitchen acyclovir (ZOVIRAX) 400 MG tablet Take 1 tablet (400 mg total) by mouth daily. 90 tablet 3  . allopurinol (ZYLOPRIM) 300 MG tablet Take 300 mg by mouth daily.     Marland Kitchen amLODipine (NORVASC) 10 MG tablet Take 10 mg by mouth daily.    . ASPIRIN LOW DOSE 81 MG EC tablet Take 81  mg by mouth daily.    Marland Kitchen dexamethasone (DECADRON) 4 MG tablet Take 5 tablets weekly on the day of cancer treatment. 60 tablet 3  . folic acid (FOLVITE) 1 MG tablet Take 1 mg by mouth daily.    . furosemide (LASIX) 20 MG tablet Take 20 mg by mouth 2 (two) times daily.     Marland Kitchen labetalol (NORMODYNE) 300 MG tablet Take 300 mg by mouth 2 (two) times daily.     Marland Kitchen lidocaine (LIDODERM) 5 % Place 1 patch onto the skin every 12 (twelve) hours. Remove & Discard patch within 12 hours or as directed by MD 20 patch 0  . losartan (COZAAR) 100 MG tablet Take 100 mg by mouth daily.    Marland Kitchen morphine (MS CONTIN) 15 MG 12 hr tablet Take 1 tablet (15 mg total) by mouth every 12 (twelve) hours. 60 tablet 0  . morphine (MSIR) 15 MG tablet Take 1 tablet (15 mg total) by mouth every 4 (four) hours as needed for severe pain. 60 tablet 0  . NARCAN 4 MG/0.1ML LIQD nasal spray kit Place 1 spray into the nose once.    . polyethylene glycol (MIRALAX / GLYCOLAX) 17 g packet Take 17 g by mouth daily as needed for moderate constipation. 14 each 0  . rosuvastatin (CRESTOR) 5 MG tablet Take 5 mg by mouth daily.    Marland Kitchen senna-docusate (SENOKOT-S) 8.6-50 MG tablet Take 1 tablet by mouth at bedtime. 100 tablet 0  . Vitamin D,  Ergocalciferol, (DRISDOL) 1.25 MG (50000 UNIT) CAPS capsule Take 50,000 Units by mouth every 14 (fourteen) days.     No current facility-administered medications for this visit.     Allergies:  Allergies  Allergen Reactions  . Amlodipine Swelling  . Pravastatin Other (See Comments)    Joint pain  . Zestril [Lisinopril] Other (See Comments)    Gout   ( Zestril) gout  . Oxycodone Other (See Comments)    Causes agitation       Physical Exam: Blood pressure 136/82, pulse 78, temperature 97.7 F (36.5 C), temperature source Tympanic, resp. rate 18, SpO2 99 %.    ECOG:  1   General appearance: Alert, awake without any distress. Head: Atraumatic without abnormalities Oropharynx: Without any thrush or  ulcers. Eyes: No scleral icterus. Lymph nodes: No lymphadenopathy noted in the cervical, supraclavicular, or axillary nodes Heart:regular rate and rhythm, without any murmurs or gallops.   Lung: Clear to auscultation without any rhonchi, wheezes or dullness to percussion. Abdomin: Soft, nontender without any shifting dullness or ascites. Musculoskeletal: No clubbing or cyanosis. Neurological: No motor or sensory deficits. Skin: No rashes or lesions.      Lab Results: Lab Results  Component Value Date   WBC 4.5 04/09/2020   HGB 10.9 (L) 04/09/2020   HCT 31.0 (L) 04/09/2020   MCV 98.7 04/09/2020   PLT 125 (L) 04/09/2020     Chemistry      Component Value Date/Time   NA 126 (L) 04/09/2020 1329   K 4.5 04/09/2020 1329   CL 96 (L) 04/09/2020 1329   CO2 20 (L) 04/09/2020 1329   BUN 13 04/09/2020 1329   CREATININE 1.10 04/09/2020 1329      Component Value Date/Time   CALCIUM 8.8 (L) 04/09/2020 1329   ALKPHOS 115 04/09/2020 1329   AST 16 04/09/2020 1329   ALT 25 04/09/2020 1329   BILITOT 0.8 04/09/2020 1329            Impression and Plan:  80 year old with:  1.  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  He was found to have M spike of 2.5 g/dL, IgA level of 2579 in addition to elevated kappa free light chain to 1318.  Cytogenetics indicated a poor prognostic features with (14,16)  translocation.  He is currently on Velcade and dexamethasone and under consideration to add Revlimid to his regimen.  Risks and benefits of continuing this treatment were discussed at this time.  Complications: Worsening neuropathy, fatigue and worsening cytopenias were discussed.  Possibility of adding Revlimid were reviewed at this time.  Given his tenuous clinical status I have opted to withhold adding any additional treatment for the time being.  We will proceed with Velcade only.   2. Back pain: Related to compression fracture and multiple myeloma.  He is currently on  long-acting morphine.  I will increase his morphine dose to 30 mg twice a day with breakthrough pain medication.  Hiss pain is likely related to his compression fracture and will make the appropriate referral to orthopedic surgery spine specialist for opinion regarding whether surgical intervention is needed.  3. Chromophobe kidney cancer: No relapse noted at this time.  4.  Antiemetics: No nausea or vomiting reported.  Antiemetics available to him.  5.  VZV prophylaxis: We will continue to be on acyclovir to prevent any reactivation.  6.  Bone directed therapy: Risks and benefits of adding Zometa given his bone disease were discussed.  Complications that include osteonecrosis of  the jaw and hypocalcemia were reiterated.  His calcium level is borderline and asked him to take calcium supplements and potentially start Zometa next week.  7. Follow-up: We will continue to follow weekly and MD follow-up in 4 weeks.   30  minutes were spent on this encounter.  The time was dedicated to reviewing his disease status, discussing treatment options and complications related to therapy.     Zola Button, MD 1/28/202212:02 PM

## 2020-04-17 NOTE — Patient Instructions (Signed)
[facility] Discharge Instructions for Patients Receiving Chemotherapy  Today you received the following chemotherapy agents Bortezemib(Velcade) To help prevent nausea and vomiting after your treatment, we encourage you to take your nausea medication as directed.  If you develop nausea and vomiting that is not controlled by your nausea medication, call the clinic.   BELOW ARE SYMPTOMS THAT SHOULD BE REPORTED IMMEDIATELY:  *FEVER GREATER THAN 100.5 F  *CHILLS WITH OR WITHOUT FEVER  NAUSEA AND VOMITING THAT IS NOT CONTROLLED WITH YOUR NAUSEA MEDICATION  *UNUSUAL SHORTNESS OF BREATH  *UNUSUAL BRUISING OR BLEEDING  TENDERNESS IN MOUTH AND THROAT WITH OR WITHOUT PRESENCE OF ULCERS  *URINARY PROBLEMS  *BOWEL PROBLEMS  UNUSUAL RASH Items with * indicate a potential emergency and should be followed up as soon as possible.  Feel free to call the clinic should you have any questions or concerns. The clinic phone number is [phone].  Please show the [facility] at check-in to the Emergency Department and triage nurse.

## 2020-04-20 DIAGNOSIS — M069 Rheumatoid arthritis, unspecified: Secondary | ICD-10-CM | POA: Diagnosis not present

## 2020-04-20 DIAGNOSIS — E785 Hyperlipidemia, unspecified: Secondary | ICD-10-CM | POA: Diagnosis not present

## 2020-04-20 DIAGNOSIS — C649 Malignant neoplasm of unspecified kidney, except renal pelvis: Secondary | ICD-10-CM | POA: Diagnosis not present

## 2020-04-20 DIAGNOSIS — I119 Hypertensive heart disease without heart failure: Secondary | ICD-10-CM | POA: Diagnosis not present

## 2020-04-20 DIAGNOSIS — I700 Atherosclerosis of aorta: Secondary | ICD-10-CM | POA: Diagnosis not present

## 2020-04-20 DIAGNOSIS — M8448XD Pathological fracture, other site, subsequent encounter for fracture with routine healing: Secondary | ICD-10-CM | POA: Diagnosis not present

## 2020-04-20 DIAGNOSIS — M1990 Unspecified osteoarthritis, unspecified site: Secondary | ICD-10-CM | POA: Diagnosis not present

## 2020-04-20 DIAGNOSIS — M109 Gout, unspecified: Secondary | ICD-10-CM | POA: Diagnosis not present

## 2020-04-20 DIAGNOSIS — C9000 Multiple myeloma not having achieved remission: Secondary | ICD-10-CM | POA: Diagnosis not present

## 2020-04-21 DIAGNOSIS — E785 Hyperlipidemia, unspecified: Secondary | ICD-10-CM | POA: Diagnosis not present

## 2020-04-21 DIAGNOSIS — M1990 Unspecified osteoarthritis, unspecified site: Secondary | ICD-10-CM | POA: Diagnosis not present

## 2020-04-21 DIAGNOSIS — M109 Gout, unspecified: Secondary | ICD-10-CM | POA: Diagnosis not present

## 2020-04-21 DIAGNOSIS — C9000 Multiple myeloma not having achieved remission: Secondary | ICD-10-CM | POA: Diagnosis not present

## 2020-04-21 DIAGNOSIS — M8448XD Pathological fracture, other site, subsequent encounter for fracture with routine healing: Secondary | ICD-10-CM | POA: Diagnosis not present

## 2020-04-21 DIAGNOSIS — I700 Atherosclerosis of aorta: Secondary | ICD-10-CM | POA: Diagnosis not present

## 2020-04-21 DIAGNOSIS — C649 Malignant neoplasm of unspecified kidney, except renal pelvis: Secondary | ICD-10-CM | POA: Diagnosis not present

## 2020-04-21 DIAGNOSIS — M069 Rheumatoid arthritis, unspecified: Secondary | ICD-10-CM | POA: Diagnosis not present

## 2020-04-21 DIAGNOSIS — I119 Hypertensive heart disease without heart failure: Secondary | ICD-10-CM | POA: Diagnosis not present

## 2020-04-22 ENCOUNTER — Encounter: Payer: Self-pay | Admitting: Orthopaedic Surgery

## 2020-04-22 ENCOUNTER — Ambulatory Visit: Payer: Medicare HMO | Admitting: Orthopaedic Surgery

## 2020-04-22 ENCOUNTER — Ambulatory Visit: Payer: Self-pay | Admitting: Urology

## 2020-04-22 VITALS — BP 105/52 | HR 76 | Ht 70.0 in | Wt 148.0 lb

## 2020-04-22 DIAGNOSIS — S32059A Unspecified fracture of fifth lumbar vertebra, initial encounter for closed fracture: Secondary | ICD-10-CM

## 2020-04-22 DIAGNOSIS — C9000 Multiple myeloma not having achieved remission: Secondary | ICD-10-CM | POA: Diagnosis not present

## 2020-04-22 NOTE — Progress Notes (Signed)
Radiation Oncology         [phone] ________________________________  Name: GRAE CANNATA MRN: [id number]  Date: 04/23/2020  DOB: February 18, 1941  Post Treatment Note  CC: [name], MD  Wyatt Portela, MD  Diagnosis:   80 year old male with painful bony lesions in the low back at L5, S1 and S2 secondary to newly diagnosed multiple myeloma and a history of renal cell carcinoma. Cytogenetics indicated poor prognostic features with (14,16)  translocation.  Interval Since Last Radiation:  4 weeks  03/05/20-03/24/20:    Spine L4-S3 treated to 20 Gy in 10 fractions  Narrative:  I spoke with the patient to conduct his routine scheduled 1 month follow up visit via telephone to spare the patient unnecessary potential exposure in the healthcare setting during the current COVID-19 pandemic.  The patient was notified in advance and gave permission to proceed with this visit format.  He tolerated radiation treatment relatively well with improved low back pain.                              On review of systems, the patient states that he is doing fairly well in general. Unfortunately, he had a fall, following infusion shortly after completing his radiation and required hospital admission for evaluation and pain management of worsened compression fracture at L5. He was hospitalized from 03/30/20 - 04/03/20 and discharged home with HHPT/OT/RN. He feels that he is making progress with PT and gradually regaining strength and mobility but continues with poorly controlled pain in the low back with activity.  He is currently on Velcade and dexamethasone, which he tolerates very well, and under consideration to add Revlimid to his regimen.  At the time of his most recent follow-up with Dr. Alen Blew on 04/17/2020, the recommendation was to withhold adding any additional treatment for the time being given his tenuous clinical status in recovery from his recent fall. He did meet with Dr. Lorin Mercy in orthopedics on  04/22/20 who recommended that they continue doing what they are doing for the time being and give it another month to see what kind of progress he is making. He will follow up with Dr. Lorin Mercy in 1 month for repeat imaging and further recommendations to come at that time regarding management of the compression fracture.  ALLERGIES:  is allergic to amlodipine, pravastatin, zestril [lisinopril], other, and oxycodone.  Meds: Current Outpatient Medications  Medication Sig Dispense Refill  . acetaminophen (TYLENOL) 500 MG tablet Take 1,000 mg by mouth daily as needed for mild pain or moderate pain.    Marland Kitchen acyclovir (ZOVIRAX) 400 MG tablet Take 1 tablet (400 mg total) by mouth daily. 90 tablet 3  . allopurinol (ZYLOPRIM) 300 MG tablet Take 300 mg by mouth daily.     Marland Kitchen amLODipine (NORVASC) 10 MG tablet Take 10 mg by mouth daily.    . ASPIRIN LOW DOSE 81 MG EC tablet Take 81 mg by mouth daily.    . calcium-vitamin D (OSCAL WITH D) 500-200 MG-UNIT tablet Take 1 tablet by mouth 2 (two) times daily. 60 tablet 3  . dexamethasone (DECADRON) 4 MG tablet Take 5 tablets weekly on the day of cancer treatment. 60 tablet 3  . folic acid (FOLVITE) 1 MG tablet Take 1 mg by mouth daily.    . furosemide (LASIX) 20 MG tablet Take 20 mg by mouth 2 (two) times daily.     Marland Kitchen labetalol (NORMODYNE) 300 MG  tablet Take 300 mg by mouth 2 (two) times daily.     Marland Kitchen lidocaine (LIDODERM) 5 % Place 1 patch onto the skin every 12 (twelve) hours. Remove & Discard patch within 12 hours or as directed by MD 20 patch 0  . losartan (COZAAR) 100 MG tablet Take 100 mg by mouth daily.    Marland Kitchen morphine (MS CONTIN) 30 MG 12 hr tablet Take 1 tablet (30 mg total) by mouth every 12 (twelve) hours. 60 tablet 0  . morphine (MSIR) 15 MG tablet Take 1 tablet (15 mg total) by mouth every 4 (four) hours as needed for severe pain. 60 tablet 0  . NARCAN 4 MG/0.1ML LIQD nasal spray kit Place 1 spray into the nose once.    . polyethylene glycol (MIRALAX / GLYCOLAX)  17 g packet Take 17 g by mouth daily as needed for moderate constipation. 14 each 0  . rosuvastatin (CRESTOR) 5 MG tablet Take 5 mg by mouth daily.    Marland Kitchen senna-docusate (SENOKOT-S) 8.6-50 MG tablet Take 1 tablet by mouth at bedtime. 100 tablet 0  . Vitamin D, Ergocalciferol, (DRISDOL) 1.25 MG (50000 UNIT) CAPS capsule Take 50,000 Units by mouth every 14 (fourteen) days.     No current facility-administered medications for this encounter.    Physical Findings:  vitals were not taken for this visit.   /Unable to assess due to telephone follow-up visit format.  Lab Findings: Lab Results  Component Value Date   WBC 6.2 04/17/2020   HGB 10.7 (L) 04/17/2020   HCT 30.7 (L) 04/17/2020   MCV 100.0 04/17/2020   PLT 190 04/17/2020     Radiographic Findings: DG Thoracic Spine 2 View  Result Date: 03/30/2020 CLINICAL DATA:  Fall at chemo on Friday with thoracic back pain. EXAM: THORACIC SPINE 2 VIEWS COMPARISON:  Chest CT 05/28/2018. FINDINGS: Chronic T4 compression fracture is unchanged from prior chest CT. No evidence of new or compression deformity or acute fracture. Flowing anterior osteophytes throughout the thoracic spine. Mild multilevel disc space narrowing. No paravertebral soft tissue abnormality to suggest fracture. No radiographic evidence of focal lesion. IMPRESSION: 1. No acute fracture or subluxation of the thoracic spine. 2. Chronic T4 compression fracture. Multilevel degenerative disc disease and DISH. Electronically Signed   By: Keith Rake M.D.   On: 03/30/2020 18:55   DG Lumbar Spine Complete  Result Date: 03/30/2020 CLINICAL DATA:  Fall a chemotherapy on Friday with continued back pain. EXAM: LUMBAR SPINE - COMPLETE 4+ VIEW COMPARISON:  PET CT 02/03/2020.  Lumbar MRI 01/08/2020 FINDINGS: Lytic lesion within L5 vertebral body, chronic mild superior endplate flattening appears similar to prior imaging. No evidence of new or acute fracture. The remaining vertebral body heights  are preserved. No obvious new focal lesion by radiograph. Known left iliac lesion is not well seen. IMPRESSION: 1. No evidence of acute fracture of the lumbar spine. 2. Lytic lesion in the L5 vertebral body with slight loss of height, grossly unchanged from recent PET/CT. No visualized new lumbar lesions. Electronically Signed   By: Keith Rake M.D.   On: 03/30/2020 18:53   CT Thoracic Spine Wo Contrast  Result Date: 03/30/2020 CLINICAL DATA:  Fall. Currently on chemotherapy. Renal cell carcinoma. EXAM: CT THORACIC AND LUMBAR SPINE WITHOUT CONTRAST TECHNIQUE: Multidetector CT imaging of the thoracic and lumbar spine was performed without contrast. Multiplanar CT image reconstructions were also generated. COMPARISON:  None. FINDINGS: CT THORACIC SPINE FINDINGS Alignment: Normal Vertebrae: There is ankylosis from T3-T11. No lytic or  blastic lesion. No acute fracture. Moderate height loss at T4 appears chronic. Paraspinal and other soft tissues: Calcific aortic atherosclerosis. Disc levels: Spinal canal patency is maintained. CT LUMBAR SPINE FINDINGS Segmentation: Standard Alignment: Normal Vertebrae: Mottled lytic lesions of L5 with vertically oriented fracture through the midportion of the vertebral body. Fracture also involves anterior wall. No retropulsion. Mild height loss. Otherwise, the bones are osteopenic without other focal lesion. Paraspinal and other soft tissues: Calcific atherosclerosis. Disc levels: No spinal canal stenosis. IMPRESSION: 1. Mottled lytic lesions of the L5 vertebral body with vertically oriented pathologic fracture through the midportion of the vertebral body and anterior wall, with mild height loss. No retropulsion or spinal canal stenosis. 2. No acute fracture or static subluxation of the thoracic or lumbar spine. 3. Moderate height loss at T4 appears chronic. 4. T3-T11 ankylosis. Aortic Atherosclerosis (ICD10-I70.0). Electronically Signed   By: Ulyses Jarred M.D.   On:  03/30/2020 23:02   CT Lumbar Spine Wo Contrast  Result Date: 03/30/2020 CLINICAL DATA:  Fall. Currently on chemotherapy. Renal cell carcinoma. EXAM: CT THORACIC AND LUMBAR SPINE WITHOUT CONTRAST TECHNIQUE: Multidetector CT imaging of the thoracic and lumbar spine was performed without contrast. Multiplanar CT image reconstructions were also generated. COMPARISON:  None. FINDINGS: CT THORACIC SPINE FINDINGS Alignment: Normal Vertebrae: There is ankylosis from T3-T11. No lytic or blastic lesion. No acute fracture. Moderate height loss at T4 appears chronic. Paraspinal and other soft tissues: Calcific aortic atherosclerosis. Disc levels: Spinal canal patency is maintained. CT LUMBAR SPINE FINDINGS Segmentation: Standard Alignment: Normal Vertebrae: Mottled lytic lesions of L5 with vertically oriented fracture through the midportion of the vertebral body. Fracture also involves anterior wall. No retropulsion. Mild height loss. Otherwise, the bones are osteopenic without other focal lesion. Paraspinal and other soft tissues: Calcific atherosclerosis. Disc levels: No spinal canal stenosis. IMPRESSION: 1. Mottled lytic lesions of the L5 vertebral body with vertically oriented pathologic fracture through the midportion of the vertebral body and anterior wall, with mild height loss. No retropulsion or spinal canal stenosis. 2. No acute fracture or static subluxation of the thoracic or lumbar spine. 3. Moderate height loss at T4 appears chronic. 4. T3-T11 ankylosis. Aortic Atherosclerosis (ICD10-I70.0). Electronically Signed   By: Ulyses Jarred M.D.   On: 03/30/2020 23:02   CT PELVIS WO CONTRAST  Result Date: 03/30/2020 CLINICAL DATA:  Pelvic trauma. Fall it chemo on Friday with continued pain. Known osseous metastatic disease. EXAM: CT PELVIS WITHOUT CONTRAST TECHNIQUE: Multidetector CT imaging of the pelvis was performed following the standard protocol without intravenous contrast. COMPARISON:  PET CT 02/03/2020,  lumbar MRI 01/08/2020 FINDINGS: Urinary Tract: Decompressed distal ureters. Mass effect on the urinary bladder from enlarged prostate gland, no definite bladder wall thickening. Bowel: No acute bowel inflammation. Moderate stool in the included colon. There is stool distending the rectum. Vascular/Lymphatic: Dense aorto bi-iliac atherosclerosis. Iliac vessels are tortuous. No enlarged pelvic lymph nodes. 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 Other: Prior right inguinal hernia repair with tacks. There is minimal fat in the left inguinal canal. No pelvic free fluid. Musculoskeletal: Known L5 lytic with pathologic fracture. The small sacral metastatic lesions are not well seen by CT. No evidence of pelvic fracture, detailed assessment limited by osseous demineralization. Left iliac low-density lesion measures fat and is consistent with a benign intraosseous lipoma. Scattered small scerlotic foci were not hypermetabolic on prior PET. 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 There  is no confluent soft tissue hematoma. IMPRESSION: 1. No evidence of acute pelvic fracture. 2. Known L5 lytic with pathologic fracture, assessed on concurrent lumbar spine CT. The small sacral metastatic lesions on prior PET are not well seen by CT. 3. 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 Aortic Atherosclerosis (ICD10-I70.0). Electronically Signed   By: Keith Rake M.D.   On: 03/30/2020 22:56    Impression/Plan: 100. 80 year old male with painful bony lesions in the low back secondary to newly diagnosed multiple myeloma and a history of renal cell carcinoma. He has recovered well from the effects of his recent radiotherapy.  He had initial improvement in his pain but after sustaining a fall shortly after completion of his radiation treatment, his pain has worsened with worsening of the L5 compression  fracture. He did meet with Dr. Lorin Mercy in orthopedics on 04/22/20 who recommended that they continue doing what they are doing for the time being and give it another month to see what kind of progress he is making. He will follow up with Dr. Lorin Mercy in 1 month for repeat imaging and further recommendations to come at that time regarding management of the compression fracture. For now, he will continue on Velcade and dexamethasone and continue working with home health physical therapy.  Dr. Alen Blew is managing his pain with long-acting morphine at 30 mg twice daily as well as 15 mg immediate release morphine for breakthrough pain.  We discussed that while we are happy to continue to participate in his care if clinically warranted in the future, at this point, we will plan to see him back on an as-needed basis.  He knows that he is welcome to call at anytime with any questions or concerns related to his previous radiation.    Nicholos Johns, PA-C

## 2020-04-23 ENCOUNTER — Ambulatory Visit
Admission: RE | Admit: 2020-04-23 | Discharge: 2020-04-23 | Disposition: A | Discharge status: Home / Self Care | Payer: Medicare HMO | Source: Ambulatory Visit | Attending: Urology | Admitting: Urology

## 2020-04-23 ENCOUNTER — Other Ambulatory Visit: Payer: Self-pay

## 2020-04-23 DIAGNOSIS — E785 Hyperlipidemia, unspecified: Secondary | ICD-10-CM | POA: Diagnosis not present

## 2020-04-23 DIAGNOSIS — M109 Gout, unspecified: Secondary | ICD-10-CM | POA: Diagnosis not present

## 2020-04-23 DIAGNOSIS — I700 Atherosclerosis of aorta: Secondary | ICD-10-CM | POA: Diagnosis not present

## 2020-04-23 DIAGNOSIS — M1990 Unspecified osteoarthritis, unspecified site: Secondary | ICD-10-CM | POA: Diagnosis not present

## 2020-04-23 DIAGNOSIS — I119 Hypertensive heart disease without heart failure: Secondary | ICD-10-CM | POA: Diagnosis not present

## 2020-04-23 DIAGNOSIS — C649 Malignant neoplasm of unspecified kidney, except renal pelvis: Secondary | ICD-10-CM | POA: Diagnosis not present

## 2020-04-23 DIAGNOSIS — M069 Rheumatoid arthritis, unspecified: Secondary | ICD-10-CM | POA: Diagnosis not present

## 2020-04-23 DIAGNOSIS — C9000 Multiple myeloma not having achieved remission: Secondary | ICD-10-CM | POA: Diagnosis not present

## 2020-04-23 DIAGNOSIS — M8448XD Pathological fracture, other site, subsequent encounter for fracture with routine healing: Secondary | ICD-10-CM | POA: Diagnosis not present

## 2020-04-23 NOTE — Progress Notes (Signed)
Office Visit Note   Patient: Devin [name]           Date of Birth: June 27, 1940           MRN: [id number] Visit Date: 04/22/2020              Requested by: Wyatt Portela, MD [place],  [place] [id number] PCP: [name], MD   Assessment & Plan: Visit Diagnoses:  1. Closed fracture of fifth lumbar vertebra, unspecified fracture morphology, initial encounter ([place])   2. 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  We discussed gradual improvement as expected week to week with a compression fracture.  He does not have spinal stenosis no indications for surgical intervention at this point.  He is under treatment for his multiple myeloma.  We will check him back again in 1 month and AP lateral lumbar spine radiographs on return.  Follow-Up Instructions: Return in about 1 month (around 05/20/2020).   Orders:  No orders of the defined types were placed in this encounter.  No orders of the defined types were placed in this encounter.     Procedures: No procedures performed   Clinical Data: No additional findings.   Subjective: Chief Complaint  Patient presents with  . 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  Patient is on chemotherapy went to the bathroom in January and fell.  When he was in the hospital for pneumonia in December radiographs demonstrated the compression fracture at L5.  He had been on methotrexate for rheumatoid arthritis and states he also has osteoarthritis.  His problems have been back pain decreased mobility.  Review of Systems positive pain with mobility tiredness, rheumatoid arthritis symptoms with joint pain.  History of stage III kidney disease, hypertension.  Patient is on chemotherapy.  Previously on methotrexate.  L5  pathologic fracture without stenosis.  All other systems noncontributory to HPI.   Objective: Vital Signs: BP (!) 105/52   Pulse 76   Ht _0  (1.778 m)   Wt 148 lb (67.1 kg)   BMI 21.24 kg/m   Physical Exam  Ortho Exam  Specialty Comments:  No specialty comments available.  Imaging: CLINICAL DATA:  Fall. Currently on chemotherapy. Renal cell carcinoma.  EXAM: CT THORACIC AND LUMBAR SPINE WITHOUT CONTRAST  TECHNIQUE: Multidetector CT imaging of the thoracic and lumbar spine was performed without contrast. Multiplanar CT image reconstructions were also generated.  COMPARISON:  None.  FINDINGS: CT THORACIC SPINE FINDINGS  Alignment: Normal  Vertebrae: There is ankylosis from T3-T11. No lytic or blastic lesion. No acute fracture. Moderate height loss at T4 appears chronic.  Paraspinal and other soft tissues: Calcific aortic atherosclerosis.  Disc levels: Spinal canal patency is maintained.  CT LUMBAR SPINE FINDINGS  Segmentation: Standard  Alignment: Normal  Vertebrae: Mottled lytic lesions of L5 with vertically oriented fracture through the midportion of the vertebral body. Fracture also involves anterior wall. No retropulsion. Mild height loss. Otherwise, the bones are osteopenic without other focal lesion.  Paraspinal and other soft tissues: Calcific atherosclerosis.  Disc levels: No spinal canal stenosis.  IMPRESSION: 1. Mottled lytic lesions of the L5 vertebral body with vertically oriented pathologic fracture through the midportion of the vertebral body and anterior wall, with mild height  loss. No retropulsion or spinal canal stenosis. 2. No acute fracture or static subluxation of the thoracic or lumbar spine. 3. Moderate height loss at T4 appears chronic. 4. T3-T11 ankylosis.  Aortic Atherosclerosis (ICD10-I70.0).   Electronically Signed   By: Ulyses Jarred M.D.   On: 03/30/2020 23:02   PMFS History: Patient Active  Problem List   Diagnosis Date Noted  . Fracture of L5 vertebra ([place]) 03/31/2020  . Intractable pain 03/31/2020  . Impaired ambulation 03/31/2020  . Closed L5 vertebral fracture ([place]) 03/31/2020  . Chronic kidney disease, stage 3 unspecified ([place]) 03/03/2020  . 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/2020  . Essential hypertension 03/03/2020  . Hardening of the aorta (main artery of the heart) ([place]) 03/03/2020  . Iron deficiency anemia 03/03/2020  . Multiple myeloma ([place]) 03/03/2020  . Obesity 03/03/2020  . Personal history of colonic polyps 03/03/2020  . Personal history of pulmonary embolism 03/03/2020  . Prediabetes 03/03/2020  . Primary gout 03/03/2020  . Pure hypercholesterolemia 03/03/2020  . 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[place]) 03/03/2020  . Thoracic aortic aneurysm without rupture ([place]) 03/03/2020  . Type 2 diabetes mellitus ([place]) 03/03/2020  . Vitamin D deficiency 03/03/2020  . Malignant tumor of kidney ([place]) 03/03/2020  . 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 Renal lesion 05/06/2016  . Delirium 05/05/2016  . Pulmonary embolus ([place]) 05/05/2016  . 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 Osteoarthritis of right knee 04/20/2016  . Rheumatoid arthritis involving right knee ([place]) 04/20/2016  . Inguinal hernia without mention of obstruction or gangrene, unilateral or unspecified, (not specified as recurrent)-left 08/10/2011   Past Medical History:  Diagnosis Date  . Arthritis   . History of pulmonary embolism 04/2016  . Hypertension   . Renal cell carcinoma (H[facility])     Family History  Problem Relation Age of Onset  . Cancer Mother        bone  . Breast cancer Neg Hx   . Colon cancer Neg Hx   . Pancreatic cancer Neg Hx   . Prostate cancer Neg Hx     Past Surgical History:  Procedure Laterality Date  . APPENDECTOMY    . DENTAL SURGERY  03/2016   all upper teeth pulled and dentures.   Marland Kitchen HERNIA REPAIR  1985&2001  . IR FLUORO GUIDED NEEDLE PLC  ASPIRATION/INJECTION LOC  02/05/2020  . right knee replacement   04/2016  . right wrist plate due to fracture     . ROBOT ASSISTED LAPAROSCOPIC NEPHRECTOMY Right 10/03/2016   Procedure: XI ROBOTIC ASSISTED LAPAROSCOPIC  PARTIAL NEPHRECTOMY;  Surgeon: Raynelle Bring, MD;  Location: [facility];  Service: Urology;  Laterality: Right;   Social History   Occupational History    Comment: retired  Tobacco Use  . Smoking status: Former Research scientist (life sciences)  . Smokeless tobacco: Former Systems developer    Quit date: 08/09/1985  Vaping Use  . Vaping Use: Never used  Substance and Sexual Activity  . Alcohol use: Yes    Comment: occ beer   . Drug use: No  . Sexual activity: Not Currently

## 2020-04-24 ENCOUNTER — Inpatient Hospital Stay: Payer: Medicare HMO

## 2020-04-24 ENCOUNTER — Inpatient Hospital Stay: Payer: Medicare HMO | Attending: Oncology

## 2020-04-24 ENCOUNTER — Other Ambulatory Visit: Payer: Self-pay

## 2020-04-24 VITALS — BP 159/83 | HR 79 | Temp 97.9°F

## 2020-04-24 DIAGNOSIS — Z5112 Encounter for antineoplastic immunotherapy: Secondary | ICD-10-CM | POA: Diagnosis not present

## 2020-04-24 DIAGNOSIS — C9000 Multiple myeloma not having achieved remission: Secondary | ICD-10-CM | POA: Insufficient documentation

## 2020-04-24 LAB — CMP (CANCER CENTER ONLY)
ALT: 17 U/L (ref 0–44)
AST: 13 U/L — ABNORMAL LOW (ref 15–41)
Albumin: 3.3 g/dL — ABNORMAL LOW (ref 3.5–5.0)
Alkaline Phosphatase: 83 U/L (ref 38–126)
Anion gap: 11 (ref 5–15)
BUN: 14 mg/dL (ref 8–23)
CO2: 23 mmol/L (ref 22–32)
Calcium: 9 mg/dL (ref 8.9–10.3)
Chloride: 99 mmol/L (ref 98–111)
Creatinine: 1.01 mg/dL (ref 0.61–1.24)
GFR, Estimated: >60 mL/min (ref >60)
Glucose, Bld: 178 mg/dL — ABNORMAL HIGH (ref 70–99)
Potassium: 4.4 mmol/L (ref 3.5–5.1)
Sodium: 133 mmol/L — ABNORMAL LOW (ref 135–145)
Total Bilirubin: 0.7 mg/dL (ref 0.3–1.2)
Total Protein: 7.2 g/dL (ref 6.5–8.1)

## 2020-04-24 LAB — CBC WITH DIFFERENTIAL (CANCER CENTER ONLY)
Abs Immature Granulocytes: 0.07 10*3/uL (ref 0.00–0.07)
Basophils Absolute: 0 10*3/uL (ref 0.0–0.1)
Basophils Relative: 0 %
Eosinophils Absolute: 0 10*3/uL (ref 0.0–0.5)
Eosinophils Relative: 0 %
HCT: 33.6 % — ABNORMAL LOW (ref 39.0–52.0)
Hemoglobin: 11.4 g/dL — ABNORMAL LOW (ref 13.0–17.0)
Immature Granulocytes: 1 %
Lymphocytes Relative: 10 %
Lymphs Abs: 0.8 10*3/uL (ref 0.7–4.0)
MCH: 33.9 pg (ref 26.0–34.0)
MCHC: 33.9 g/dL (ref 30.0–36.0)
MCV: 100 fL (ref 80.0–100.0)
Monocytes Absolute: 0.1 10*3/uL (ref 0.1–1.0)
Monocytes Relative: 1 %
Neutro Abs: 7.2 10*3/uL (ref 1.7–7.7)
Neutrophils Relative %: 88 %
Platelet Count: 173 10*3/uL (ref 150–400)
RBC: 3.36 MIL/uL — ABNORMAL LOW (ref 4.22–5.81)
RDW: 14.1 % (ref 11.5–15.5)
WBC Count: 8.2 10*3/uL (ref 4.0–10.5)
nRBC: 0 % (ref 0.0–0.2)

## 2020-04-24 MED ORDER — PROCHLORPERAZINE MALEATE 10 MG PO TABS
10.0000 mg | ORAL_TABLET | Freq: Once | ORAL | Status: AC
Start: 2020-04-24 — End: 2020-04-24
  Administered 2020-04-24: 10 mg via ORAL

## 2020-04-24 MED ORDER — PROCHLORPERAZINE MALEATE 10 MG PO TABS
ORAL_TABLET | ORAL | Status: AC
  Filled 2020-04-24: qty 1

## 2020-04-24 MED ORDER — SODIUM CHLORIDE 0.9 % IV SOLN
Freq: Once | INTRAVENOUS | Status: AC
  Filled 2020-04-24: qty 250

## 2020-04-24 MED ORDER — BORTEZOMIB CHEMO IV INJECTION 3.5 MG (1MG/ML-GENERIC)
1.0000 mg/m2 | Freq: Once | INTRAVENOUS | Status: AC
  Administered 2020-04-24: 2 mg via INTRAVENOUS
  Filled 2020-04-24: qty 2

## 2020-04-24 NOTE — Patient Instructions (Signed)
[facility] Discharge Instructions for Patients Receiving Chemotherapy  Today you received the following chemotherapy agents: bortezomib.  To help prevent nausea and vomiting after your treatment, we encourage you to take your nausea medication as directed.   If you develop nausea and vomiting that is not controlled by your nausea medication, call the clinic.   BELOW ARE SYMPTOMS THAT SHOULD BE REPORTED IMMEDIATELY:  *FEVER GREATER THAN 100.5 F  *CHILLS WITH OR WITHOUT FEVER  NAUSEA AND VOMITING THAT IS NOT CONTROLLED WITH YOUR NAUSEA MEDICATION  *UNUSUAL SHORTNESS OF BREATH  *UNUSUAL BRUISING OR BLEEDING  TENDERNESS IN MOUTH AND THROAT WITH OR WITHOUT PRESENCE OF ULCERS  *URINARY PROBLEMS  *BOWEL PROBLEMS  UNUSUAL RASH Items with * indicate a potential emergency and should be followed up as soon as possible.  Feel free to call the clinic should you have any questions or concerns. The clinic phone number is [phone].  Please show the CHEMO ALERT CARD at check-in to the Emergency Department and triage nurse.   

## 2020-04-27 DIAGNOSIS — I119 Hypertensive heart disease without heart failure: Secondary | ICD-10-CM | POA: Diagnosis not present

## 2020-04-27 DIAGNOSIS — E785 Hyperlipidemia, unspecified: Secondary | ICD-10-CM | POA: Diagnosis not present

## 2020-04-27 DIAGNOSIS — C9000 Multiple myeloma not having achieved remission: Secondary | ICD-10-CM | POA: Diagnosis not present

## 2020-04-27 DIAGNOSIS — M8448XD Pathological fracture, other site, subsequent encounter for fracture with routine healing: Secondary | ICD-10-CM | POA: Diagnosis not present

## 2020-04-27 DIAGNOSIS — M1990 Unspecified osteoarthritis, unspecified site: Secondary | ICD-10-CM | POA: Diagnosis not present

## 2020-04-27 DIAGNOSIS — C649 Malignant neoplasm of unspecified kidney, except renal pelvis: Secondary | ICD-10-CM | POA: Diagnosis not present

## 2020-04-27 DIAGNOSIS — M109 Gout, unspecified: Secondary | ICD-10-CM | POA: Diagnosis not present

## 2020-04-27 DIAGNOSIS — M069 Rheumatoid arthritis, unspecified: Secondary | ICD-10-CM | POA: Diagnosis not present

## 2020-04-27 DIAGNOSIS — I700 Atherosclerosis of aorta: Secondary | ICD-10-CM | POA: Diagnosis not present

## 2020-04-29 ENCOUNTER — Inpatient Hospital Stay
Admission: RE | Admit: 2020-04-29 | Discharge: 2020-04-29 | Disposition: A | Discharge status: Home / Self Care | Payer: Self-pay | Source: Ambulatory Visit | Attending: Urology | Admitting: Urology

## 2020-04-29 DIAGNOSIS — C649 Malignant neoplasm of unspecified kidney, except renal pelvis: Secondary | ICD-10-CM | POA: Diagnosis not present

## 2020-04-29 DIAGNOSIS — M109 Gout, unspecified: Secondary | ICD-10-CM | POA: Diagnosis not present

## 2020-04-29 DIAGNOSIS — M069 Rheumatoid arthritis, unspecified: Secondary | ICD-10-CM | POA: Diagnosis not present

## 2020-04-29 DIAGNOSIS — I119 Hypertensive heart disease without heart failure: Secondary | ICD-10-CM | POA: Diagnosis not present

## 2020-04-29 DIAGNOSIS — C9000 Multiple myeloma not having achieved remission: Secondary | ICD-10-CM | POA: Diagnosis not present

## 2020-04-29 DIAGNOSIS — M8448XD Pathological fracture, other site, subsequent encounter for fracture with routine healing: Secondary | ICD-10-CM | POA: Diagnosis not present

## 2020-04-29 DIAGNOSIS — I700 Atherosclerosis of aorta: Secondary | ICD-10-CM | POA: Diagnosis not present

## 2020-04-29 DIAGNOSIS — E785 Hyperlipidemia, unspecified: Secondary | ICD-10-CM | POA: Diagnosis not present

## 2020-04-29 DIAGNOSIS — M1990 Unspecified osteoarthritis, unspecified site: Secondary | ICD-10-CM | POA: Diagnosis not present

## 2020-04-30 ENCOUNTER — Ambulatory Visit: Payer: Medicare HMO | Admitting: Urology

## 2020-04-30 DIAGNOSIS — M1990 Unspecified osteoarthritis, unspecified site: Secondary | ICD-10-CM | POA: Diagnosis not present

## 2020-04-30 DIAGNOSIS — I700 Atherosclerosis of aorta: Secondary | ICD-10-CM | POA: Diagnosis not present

## 2020-04-30 DIAGNOSIS — I119 Hypertensive heart disease without heart failure: Secondary | ICD-10-CM | POA: Diagnosis not present

## 2020-04-30 DIAGNOSIS — M109 Gout, unspecified: Secondary | ICD-10-CM | POA: Diagnosis not present

## 2020-04-30 DIAGNOSIS — M069 Rheumatoid arthritis, unspecified: Secondary | ICD-10-CM | POA: Diagnosis not present

## 2020-04-30 DIAGNOSIS — C649 Malignant neoplasm of unspecified kidney, except renal pelvis: Secondary | ICD-10-CM | POA: Diagnosis not present

## 2020-04-30 DIAGNOSIS — M8448XD Pathological fracture, other site, subsequent encounter for fracture with routine healing: Secondary | ICD-10-CM | POA: Diagnosis not present

## 2020-04-30 DIAGNOSIS — C9000 Multiple myeloma not having achieved remission: Secondary | ICD-10-CM | POA: Diagnosis not present

## 2020-04-30 DIAGNOSIS — E785 Hyperlipidemia, unspecified: Secondary | ICD-10-CM | POA: Diagnosis not present

## 2020-05-01 ENCOUNTER — Inpatient Hospital Stay: Payer: Medicare HMO

## 2020-05-01 ENCOUNTER — Other Ambulatory Visit: Payer: Self-pay

## 2020-05-01 VITALS — BP 134/78 | HR 78 | Temp 98.5°F | Resp 18

## 2020-05-01 DIAGNOSIS — Z85528 Personal history of other malignant neoplasm of kidney: Secondary | ICD-10-CM | POA: Diagnosis not present

## 2020-05-01 DIAGNOSIS — S72142A Displaced intertrochanteric fracture of left femur, initial encounter for closed fracture: Secondary | ICD-10-CM | POA: Diagnosis not present

## 2020-05-01 DIAGNOSIS — M6281 Muscle weakness (generalized): Secondary | ICD-10-CM | POA: Diagnosis not present

## 2020-05-01 DIAGNOSIS — C649 Malignant neoplasm of unspecified kidney, except renal pelvis: Secondary | ICD-10-CM | POA: Diagnosis not present

## 2020-05-01 DIAGNOSIS — G893 Neoplasm related pain (acute) (chronic): Secondary | ICD-10-CM | POA: Diagnosis not present

## 2020-05-01 DIAGNOSIS — S0990XA Unspecified injury of head, initial encounter: Secondary | ICD-10-CM | POA: Diagnosis not present

## 2020-05-01 DIAGNOSIS — W01198A Fall on same level from slipping, tripping and stumbling with subsequent striking against other object, initial encounter: Secondary | ICD-10-CM | POA: Diagnosis not present

## 2020-05-01 DIAGNOSIS — R7303 Prediabetes: Secondary | ICD-10-CM | POA: Diagnosis not present

## 2020-05-01 DIAGNOSIS — C9000 Multiple myeloma not having achieved remission: Secondary | ICD-10-CM

## 2020-05-01 DIAGNOSIS — E119 Type 2 diabetes mellitus without complications: Secondary | ICD-10-CM | POA: Diagnosis not present

## 2020-05-01 DIAGNOSIS — Z043 Encounter for examination and observation following other accident: Secondary | ICD-10-CM | POA: Diagnosis not present

## 2020-05-01 DIAGNOSIS — Z5112 Encounter for antineoplastic immunotherapy: Secondary | ICD-10-CM | POA: Diagnosis not present

## 2020-05-01 DIAGNOSIS — Z20822 Contact with and (suspected) exposure to covid-19: Secondary | ICD-10-CM | POA: Diagnosis not present

## 2020-05-01 DIAGNOSIS — M069 Rheumatoid arthritis, unspecified: Secondary | ICD-10-CM | POA: Diagnosis not present

## 2020-05-01 DIAGNOSIS — M1990 Unspecified osteoarthritis, unspecified site: Secondary | ICD-10-CM | POA: Diagnosis not present

## 2020-05-01 DIAGNOSIS — I2699 Other pulmonary embolism without acute cor pulmonale: Secondary | ICD-10-CM | POA: Diagnosis not present

## 2020-05-01 DIAGNOSIS — R2689 Other abnormalities of gait and mobility: Secondary | ICD-10-CM | POA: Diagnosis not present

## 2020-05-01 DIAGNOSIS — E785 Hyperlipidemia, unspecified: Secondary | ICD-10-CM | POA: Diagnosis not present

## 2020-05-01 DIAGNOSIS — M109 Gout, unspecified: Secondary | ICD-10-CM | POA: Diagnosis not present

## 2020-05-01 DIAGNOSIS — Z4789 Encounter for other orthopedic aftercare: Secondary | ICD-10-CM | POA: Diagnosis not present

## 2020-05-01 DIAGNOSIS — Z86711 Personal history of pulmonary embolism: Secondary | ICD-10-CM | POA: Diagnosis not present

## 2020-05-01 DIAGNOSIS — D62 Acute posthemorrhagic anemia: Secondary | ICD-10-CM | POA: Diagnosis not present

## 2020-05-01 DIAGNOSIS — S72142S Displaced intertrochanteric fracture of left femur, sequela: Secondary | ICD-10-CM | POA: Diagnosis not present

## 2020-05-01 DIAGNOSIS — I10 Essential (primary) hypertension: Secondary | ICD-10-CM | POA: Diagnosis not present

## 2020-05-01 DIAGNOSIS — J849 Interstitial pulmonary disease, unspecified: Secondary | ICD-10-CM | POA: Diagnosis not present

## 2020-05-01 LAB — CBC WITH DIFFERENTIAL (CANCER CENTER ONLY)
Abs Immature Granulocytes: 0.09 10*3/uL — ABNORMAL HIGH (ref 0.00–0.07)
Basophils Absolute: 0 10*3/uL (ref 0.0–0.1)
Basophils Relative: 0 %
Eosinophils Absolute: 0.1 10*3/uL (ref 0.0–0.5)
Eosinophils Relative: 1 %
HCT: 32.1 % — ABNORMAL LOW (ref 39.0–52.0)
Hemoglobin: 11 g/dL — ABNORMAL LOW (ref 13.0–17.0)
Immature Granulocytes: 1 %
Lymphocytes Relative: 10 %
Lymphs Abs: 0.9 10*3/uL (ref 0.7–4.0)
MCH: 34 pg (ref 26.0–34.0)
MCHC: 34.3 g/dL (ref 30.0–36.0)
MCV: 99.1 fL (ref 80.0–100.0)
Monocytes Absolute: 0.2 10*3/uL (ref 0.1–1.0)
Monocytes Relative: 2 %
Neutro Abs: 7.7 10*3/uL (ref 1.7–7.7)
Neutrophils Relative %: 86 %
Platelet Count: 166 10*3/uL (ref 150–400)
RBC: 3.24 MIL/uL — ABNORMAL LOW (ref 4.22–5.81)
RDW: 14.1 % (ref 11.5–15.5)
WBC Count: 9.1 10*3/uL (ref 4.0–10.5)
nRBC: 0 % (ref 0.0–0.2)

## 2020-05-01 LAB — CMP (CANCER CENTER ONLY)
ALT: 22 U/L (ref 0–44)
AST: 15 U/L (ref 15–41)
Albumin: 3.3 g/dL — ABNORMAL LOW (ref 3.5–5.0)
Alkaline Phosphatase: 79 U/L (ref 38–126)
Anion gap: 9 (ref 5–15)
BUN: 13 mg/dL (ref 8–23)
CO2: 23 mmol/L (ref 22–32)
Calcium: 8.9 mg/dL (ref 8.9–10.3)
Chloride: 100 mmol/L (ref 98–111)
Creatinine: 1.08 mg/dL (ref 0.61–1.24)
GFR, Estimated: >60 mL/min (ref >60)
Glucose, Bld: 184 mg/dL — ABNORMAL HIGH (ref 70–99)
Potassium: 4.2 mmol/L (ref 3.5–5.1)
Sodium: 132 mmol/L — ABNORMAL LOW (ref 135–145)
Total Bilirubin: 0.6 mg/dL (ref 0.3–1.2)
Total Protein: 7 g/dL (ref 6.5–8.1)

## 2020-05-01 MED ORDER — PROCHLORPERAZINE MALEATE 10 MG PO TABS
ORAL_TABLET | ORAL | Status: AC
  Filled 2020-05-01: qty 1

## 2020-05-01 MED ORDER — BORTEZOMIB CHEMO IV INJECTION 3.5 MG (1MG/ML-GENERIC)
1.0000 mg/m2 | Freq: Once | INTRAVENOUS | Status: AC
  Administered 2020-05-01: 2 mg via INTRAVENOUS
  Filled 2020-05-01: qty 2

## 2020-05-01 MED ORDER — SODIUM CHLORIDE 0.9 % IV SOLN
Freq: Once | INTRAVENOUS | Status: AC
Start: 2020-05-01 — End: 2020-05-01
  Filled 2020-05-01: qty 250

## 2020-05-01 MED ORDER — SODIUM CHLORIDE 0.9 % IV SOLN
Freq: Once | INTRAVENOUS | Status: AC
  Filled 2020-05-01: qty 250

## 2020-05-01 MED ORDER — ZOLEDRONIC ACID 4 MG/100ML IV SOLN
INTRAVENOUS | Status: AC
  Filled 2020-05-01: qty 100

## 2020-05-01 MED ORDER — ZOLEDRONIC ACID 4 MG/100ML IV SOLN
4.0000 mg | Freq: Once | INTRAVENOUS | Status: AC
  Administered 2020-05-01: 4 mg via INTRAVENOUS

## 2020-05-01 MED ORDER — PROCHLORPERAZINE MALEATE 10 MG PO TABS
10.0000 mg | ORAL_TABLET | Freq: Once | ORAL | Status: AC
  Administered 2020-05-01: 10 mg via ORAL

## 2020-05-01 NOTE — Patient Instructions (Addendum)
[facility] Discharge Instructions for Patients Receiving Chemotherapy  Today you received the following chemotherapy agents: Bortezomib (Velcade)  To help prevent nausea and vomiting after your treatment, we encourage you to take your nausea medication  as prescribed.    If you develop nausea and vomiting that is not controlled by your nausea medication, call the clinic.   BELOW ARE SYMPTOMS THAT SHOULD BE REPORTED IMMEDIATELY:  *FEVER GREATER THAN 100.5 F  *CHILLS WITH OR WITHOUT FEVER  NAUSEA AND VOMITING THAT IS NOT CONTROLLED WITH YOUR NAUSEA MEDICATION  *UNUSUAL SHORTNESS OF BREATH  *UNUSUAL BRUISING OR BLEEDING  TENDERNESS IN MOUTH AND THROAT WITH OR WITHOUT PRESENCE OF ULCERS  *URINARY PROBLEMS  *BOWEL PROBLEMS  UNUSUAL RASH Items with * indicate a potential emergency and should be followed up as soon as possible.  Feel free to call the clinic should you have any questions or concerns. The clinic phone number is [phone].  Please show the CHEMO ALERT CARD at check-in to the Emergency Department and triage nurse.  Zoledronic Acid Injection (Hypercalcemia, Oncology) What is this medicine? ZOLEDRONIC ACID (ZOE le dron ik AS id) slows calcium loss from bones. It high calcium levels in the blood from some kinds of cancer. It may be used in other people at risk for bone loss. This medicine may be used for other purposes; ask your health care provider or pharmacist if you have questions. COMMON BRAND NAME(S): Zometa What should I tell my health care provider before I take this medicine? They need to know if you have any of these conditions:  cancer  dehydration  dental disease  kidney disease  liver disease  low levels of calcium in the blood  lung or breathing disease (asthma)  receiving steroids like dexamethasone or prednisone  an unusual or allergic reaction to zoledronic acid, other medicines, foods, dyes, or  preservatives  pregnant or trying to get pregnant  breast-feeding How should I use this medicine? This drug is injected into a vein. It is given by a health care provider in a hospital or clinic setting. Talk to your health care provider about the use of this drug in children. Special care may be needed. Overdosage: If you think you have taken too much of this medicine contact a poison control center or emergency room at once. NOTE: This medicine is only for you. Do not share this medicine with others. What if I miss a dose? Keep appointments for follow-up doses. It is important not to miss your dose. Call your health care provider if you are unable to keep an appointment. What may interact with this medicine?  certain antibiotics given by injection  NSAIDs, medicines for pain and inflammation, like ibuprofen or naproxen  some diuretics like bumetanide, furosemide  teriparatide  thalidomide This list may not describe all possible interactions. Give your health care provider a list of all the medicines, herbs, non-prescription drugs, or dietary supplements you use. Also tell them if you smoke, drink alcohol, or use illegal drugs. Some items may interact with your medicine. What should I watch for while using this medicine? Visit your health care provider for regular checks on your progress. It may be some time before you see the benefit from this drug. Some people who take this drug have severe bone, joint, or muscle pain. This drug may also increase your risk for jaw problems or a broken thigh bone. Tell your health care provider right away if you have severe pain in your   jaw, bones, joints, or muscles. Tell you health care provider if you have any pain that does not go away or that gets worse. Tell your dentist and dental surgeon that you are taking this drug. You should not have major dental surgery while on this drug. See your dentist to have a dental exam and fix any dental problems  before starting this drug. Take good care of your teeth while on this drug. Make sure you see your dentist for regular follow-up appointments. You should make sure you get enough calcium and vitamin D while you are taking this drug. Discuss the foods you eat and the vitamins you take with your health care provider. Check with your health care provider if you have severe diarrhea, nausea, and vomiting, or if you sweat a lot. The loss of too much body fluid may make it dangerous for you to take this drug. You may need blood work done while you are taking this drug. Do not become pregnant while taking this drug. Women should inform their health care provider if they wish to become pregnant or think they might be pregnant. There is potential for serious harm to an unborn child. Talk to your health care provider for more information. What side effects may I notice from receiving this medicine? Side effects that you should report to your doctor or health care provider as soon as possible:  allergic reactions (skin rash, itching or hives; swelling of the face, lips, or tongue)  bone pain  infection (fever, chills, cough, sore throat, pain or trouble passing urine)  jaw pain, especially after dental work  joint pain  kidney injury (trouble passing urine or change in the amount of urine)  low blood pressure (dizziness; feeling faint or lightheaded, falls; unusually weak or tired)  low calcium levels (fast heartbeat; muscle cramps or pain; pain, tingling, or numbness in the hands or feet; seizures)  low magnesium levels (fast, irregular heartbeat; muscle cramp or pain; muscle weakness; tremors; seizures)  low red blood cell counts (trouble breathing; feeling faint; lightheaded, falls; unusually weak or tired)  muscle pain  redness, blistering, peeling, or loosening of the skin, including inside the mouth  severe diarrhea  swelling of the ankles, feet, hands  trouble breathing Side effects  that usually do not require medical attention (report to your doctor or health care provider if they continue or are bothersome):  anxious  constipation  coughing  depressed mood  eye irritation, itching, or pain  fever  general ill feeling or flu-like symptoms  nausea  pain, redness, or irritation at site where injected  trouble sleeping This list may not describe all possible side effects. Call your doctor for medical advice about side effects. You may report side effects to FDA at 1-800-FDA-1088. Where should I keep my medicine? This drug is given in a hospital or clinic. It will not be stored at home. NOTE: This sheet is a summary. It may not cover all possible information. If you have questions about this medicine, talk to your doctor, pharmacist, or health care provider.  2021 Elsevier/Gold Standard (2018-12-20 09:13:00)  

## 2020-05-02 DIAGNOSIS — S72142A Displaced intertrochanteric fracture of left femur, initial encounter for closed fracture: Secondary | ICD-10-CM | POA: Diagnosis not present

## 2020-05-04 ENCOUNTER — Telehealth: Payer: Self-pay

## 2020-05-04 NOTE — Telephone Encounter (Signed)
Received a call from patient spouse Devin [name] stating that the patient had a fall in the home last Friday, broke his hip, and had surgery on Sat and is still at [facility]. Explained that Dr. Alen Blew has been updated on the situation and the patient is to resume his Friday appt's when he is able to resume. Explained that will cancel this Friday's appts and they can then call if they are unable to keep the appts on 2/25. Pat verbalized understanding and had no other questions or concerns.

## 2020-05-08 ENCOUNTER — Ambulatory Visit: Payer: Medicare HMO

## 2020-05-08 ENCOUNTER — Other Ambulatory Visit: Payer: Medicare HMO

## 2020-05-12 DIAGNOSIS — S72142S Displaced intertrochanteric fracture of left femur, sequela: Secondary | ICD-10-CM | POA: Diagnosis not present

## 2020-05-12 DIAGNOSIS — C649 Malignant neoplasm of unspecified kidney, except renal pelvis: Secondary | ICD-10-CM | POA: Diagnosis not present

## 2020-05-12 DIAGNOSIS — M069 Rheumatoid arthritis, unspecified: Secondary | ICD-10-CM | POA: Diagnosis not present

## 2020-05-12 DIAGNOSIS — Z4789 Encounter for other orthopedic aftercare: Secondary | ICD-10-CM | POA: Diagnosis not present

## 2020-05-12 DIAGNOSIS — S72142D Displaced intertrochanteric fracture of left femur, subsequent encounter for closed fracture with routine healing: Secondary | ICD-10-CM | POA: Diagnosis not present

## 2020-05-12 DIAGNOSIS — D509 Iron deficiency anemia, unspecified: Secondary | ICD-10-CM | POA: Diagnosis not present

## 2020-05-12 DIAGNOSIS — J849 Interstitial pulmonary disease, unspecified: Secondary | ICD-10-CM | POA: Diagnosis not present

## 2020-05-12 DIAGNOSIS — R2689 Other abnormalities of gait and mobility: Secondary | ICD-10-CM | POA: Diagnosis not present

## 2020-05-12 DIAGNOSIS — I10 Essential (primary) hypertension: Secondary | ICD-10-CM | POA: Diagnosis not present

## 2020-05-12 DIAGNOSIS — M6281 Muscle weakness (generalized): Secondary | ICD-10-CM | POA: Diagnosis not present

## 2020-05-12 DIAGNOSIS — Z79899 Other long term (current) drug therapy: Secondary | ICD-10-CM | POA: Diagnosis not present

## 2020-05-12 DIAGNOSIS — D62 Acute posthemorrhagic anemia: Secondary | ICD-10-CM | POA: Diagnosis not present

## 2020-05-12 DIAGNOSIS — E119 Type 2 diabetes mellitus without complications: Secondary | ICD-10-CM | POA: Diagnosis not present

## 2020-05-12 DIAGNOSIS — C9000 Multiple myeloma not having achieved remission: Secondary | ICD-10-CM | POA: Diagnosis not present

## 2020-05-12 DIAGNOSIS — D539 Nutritional anemia, unspecified: Secondary | ICD-10-CM | POA: Diagnosis not present

## 2020-05-12 DIAGNOSIS — I2699 Other pulmonary embolism without acute cor pulmonale: Secondary | ICD-10-CM | POA: Diagnosis not present

## 2020-05-13 DIAGNOSIS — C9000 Multiple myeloma not having achieved remission: Secondary | ICD-10-CM | POA: Diagnosis not present

## 2020-05-13 DIAGNOSIS — E119 Type 2 diabetes mellitus without complications: Secondary | ICD-10-CM | POA: Diagnosis not present

## 2020-05-13 DIAGNOSIS — D539 Nutritional anemia, unspecified: Secondary | ICD-10-CM | POA: Diagnosis not present

## 2020-05-13 DIAGNOSIS — D62 Acute posthemorrhagic anemia: Secondary | ICD-10-CM | POA: Diagnosis not present

## 2020-05-13 DIAGNOSIS — I10 Essential (primary) hypertension: Secondary | ICD-10-CM | POA: Diagnosis not present

## 2020-05-13 DIAGNOSIS — S72142D Displaced intertrochanteric fracture of left femur, subsequent encounter for closed fracture with routine healing: Secondary | ICD-10-CM | POA: Diagnosis not present

## 2020-05-14 ENCOUNTER — Telehealth: Payer: Self-pay

## 2020-05-14 NOTE — Telephone Encounter (Signed)
Spoke to patient's wife regarding patient's appointments scheduled for 05/15/20.  Patient is currently in inpatient rehab and will not be able to attend his appointments.  Wife states she believes patient will be able to attend his appointments on 05/22/20. Instructed wife to contact office if he is unable to attend. Wife verbalized understanding.

## 2020-05-15 ENCOUNTER — Other Ambulatory Visit: Payer: Medicare HMO

## 2020-05-15 ENCOUNTER — Ambulatory Visit: Payer: Medicare HMO

## 2020-05-18 DIAGNOSIS — Z4789 Encounter for other orthopedic aftercare: Secondary | ICD-10-CM | POA: Diagnosis not present

## 2020-05-19 ENCOUNTER — Ambulatory Visit: Payer: Medicare HMO | Admitting: Orthopaedic Surgery

## 2020-05-20 DIAGNOSIS — S72142D Displaced intertrochanteric fracture of left femur, subsequent encounter for closed fracture with routine healing: Secondary | ICD-10-CM | POA: Diagnosis not present

## 2020-05-20 DIAGNOSIS — C9000 Multiple myeloma not having achieved remission: Secondary | ICD-10-CM | POA: Diagnosis not present

## 2020-05-20 DIAGNOSIS — D62 Acute posthemorrhagic anemia: Secondary | ICD-10-CM | POA: Diagnosis not present

## 2020-05-20 DIAGNOSIS — I10 Essential (primary) hypertension: Secondary | ICD-10-CM | POA: Diagnosis not present

## 2020-05-20 DIAGNOSIS — D509 Iron deficiency anemia, unspecified: Secondary | ICD-10-CM | POA: Diagnosis not present

## 2020-05-20 DIAGNOSIS — Z79899 Other long term (current) drug therapy: Secondary | ICD-10-CM | POA: Diagnosis not present

## 2020-05-20 DIAGNOSIS — E119 Type 2 diabetes mellitus without complications: Secondary | ICD-10-CM | POA: Diagnosis not present

## 2020-05-22 ENCOUNTER — Inpatient Hospital Stay: Payer: Medicare HMO

## 2020-05-22 ENCOUNTER — Inpatient Hospital Stay: Payer: Medicare HMO | Admitting: Oncology

## 2020-05-22 ENCOUNTER — Inpatient Hospital Stay: Payer: Medicare HMO | Attending: Oncology

## 2020-05-22 ENCOUNTER — Other Ambulatory Visit: Payer: Self-pay

## 2020-05-22 ENCOUNTER — Other Ambulatory Visit: Payer: Self-pay | Admitting: Oncology

## 2020-05-22 ENCOUNTER — Telehealth: Payer: Self-pay | Admitting: Oncology

## 2020-05-22 VITALS — BP 154/91 | HR 85 | Temp 97.6°F | Resp 18 | Wt 168.6 lb

## 2020-05-22 VITALS — BP 152/94 | HR 92 | Resp 18

## 2020-05-22 DIAGNOSIS — C9000 Multiple myeloma not having achieved remission: Secondary | ICD-10-CM

## 2020-05-22 DIAGNOSIS — Z5112 Encounter for antineoplastic immunotherapy: Secondary | ICD-10-CM | POA: Diagnosis not present

## 2020-05-22 LAB — CMP (CANCER CENTER ONLY)
ALT: 11 U/L (ref 0–44)
AST: 13 U/L — ABNORMAL LOW (ref 15–41)
Albumin: 3.5 g/dL (ref 3.5–5.0)
Alkaline Phosphatase: 115 U/L (ref 38–126)
Anion gap: 7 (ref 5–15)
BUN: 10 mg/dL (ref 8–23)
CO2: 20 mmol/L — ABNORMAL LOW (ref 22–32)
Calcium: 8.4 mg/dL — ABNORMAL LOW (ref 8.9–10.3)
Chloride: 103 mmol/L (ref 98–111)
Creatinine: 0.94 mg/dL (ref 0.61–1.24)
GFR, Estimated: >60 mL/min (ref >60)
Glucose, Bld: 177 mg/dL — ABNORMAL HIGH (ref 70–99)
Potassium: 4.3 mmol/L (ref 3.5–5.1)
Sodium: 130 mmol/L — ABNORMAL LOW (ref 135–145)
Total Bilirubin: 0.6 mg/dL (ref 0.3–1.2)
Total Protein: 7.4 g/dL (ref 6.5–8.1)

## 2020-05-22 LAB — CBC WITH DIFFERENTIAL (CANCER CENTER ONLY)
Abs Immature Granulocytes: 0.03 10*3/uL (ref 0.00–0.07)
Basophils Absolute: 0 10*3/uL (ref 0.0–0.1)
Basophils Relative: 0 %
Eosinophils Absolute: 0 10*3/uL (ref 0.0–0.5)
Eosinophils Relative: 0 %
HCT: 33.7 % — ABNORMAL LOW (ref 39.0–52.0)
Hemoglobin: 10.8 g/dL — ABNORMAL LOW (ref 13.0–17.0)
Immature Granulocytes: 0 %
Lymphocytes Relative: 8 %
Lymphs Abs: 0.7 10*3/uL (ref 0.7–4.0)
MCH: 31.9 pg (ref 26.0–34.0)
MCHC: 32 g/dL (ref 30.0–36.0)
MCV: 99.4 fL (ref 80.0–100.0)
Monocytes Absolute: 0.1 10*3/uL (ref 0.1–1.0)
Monocytes Relative: 1 %
Neutro Abs: 7.6 10*3/uL (ref 1.7–7.7)
Neutrophils Relative %: 91 %
Platelet Count: 224 10*3/uL (ref 150–400)
RBC: 3.39 MIL/uL — ABNORMAL LOW (ref 4.22–5.81)
RDW: 14.7 % (ref 11.5–15.5)
WBC Count: 8.5 10*3/uL (ref 4.0–10.5)
nRBC: 0 % (ref 0.0–0.2)

## 2020-05-22 MED ORDER — SODIUM CHLORIDE 0.9 % IV SOLN
Freq: Once | INTRAVENOUS | Status: AC
  Filled 2020-05-22: qty 250

## 2020-05-22 MED ORDER — PROCHLORPERAZINE MALEATE 10 MG PO TABS
10.0000 mg | ORAL_TABLET | Freq: Once | ORAL | Status: AC
  Administered 2020-05-22: 10 mg via ORAL

## 2020-05-22 MED ORDER — PROCHLORPERAZINE MALEATE 10 MG PO TABS
ORAL_TABLET | ORAL | Status: AC
  Filled 2020-05-22: qty 1

## 2020-05-22 MED ORDER — BORTEZOMIB CHEMO IV INJECTION 3.5 MG (1MG/ML-GENERIC)
2.0000 mg | Freq: Once | INTRAVENOUS | Status: AC
  Administered 2020-05-22: 2 mg via INTRAVENOUS
  Filled 2020-05-22: qty 2

## 2020-05-22 NOTE — Progress Notes (Signed)
Hematology and Oncology Follow Up Visit  Devin [name] [id number] [birth year] 80 y.o. 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  He was found to IgA  level 2579, M spike of 2.5 g/dL, Bone marrow biopsy showed 50% plasma cell involvement with 13 q. deletion, duplication 1 q, (05;39) translocation indicating a poor prognostic features.   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   Prior Therapy:   He status post right robotic laparoscopic partial nephrectomy on October 03, 2016.  Palliative radiation therapy to the spine.  He completed 20 Gy in 10 fractions on March 24, 2020.  Current therapy:   Velcade and dexamethasone started on January 7 to be given weekly.  He is here for the next cycle of treatment.   Interim History: Devin [name] is here for return evaluation.  Since the last visit, he has sustained a fall and suffered a pelvic fracture required brief hospitalization and surgical fixation for left intertrochanteric femur oh fracture completed at [facility].  Since his discharge, he has been participating in physical therapy with improvement in his mobility.  Is not reporting any worsening hip pain at this time.  He has tolerated Velcade and dexamethasone reasonably well and his last injection was on February 11 after which she suffered a fracture.  He is improving with his mobility currently using a walker.  His pain is manageable utilizing morphine.    Medications: Updated on review. Current Outpatient Medications  Medication Sig Dispense Refill  . acetaminophen (TYLENOL) 500 MG tablet Take 1,000 mg by mouth daily as needed for mild pain or moderate pain.    Marland Kitchen acyclovir (ZOVIRAX) 400 MG tablet Take 1 tablet (400 mg total) by mouth daily. 90 tablet 3  . allopurinol (ZYLOPRIM) 300 MG tablet  Take 300 mg by mouth daily.     Marland Kitchen amLODipine (NORVASC) 10 MG tablet Take 10 mg by mouth daily.    . ASPIRIN LOW DOSE 81 MG EC tablet Take 81 mg by mouth daily.    . calcium-vitamin D (OSCAL WITH D) 500-200 MG-UNIT tablet Take 1 tablet by mouth 2 (two) times daily. 60 tablet 3  . dexamethasone (DECADRON) 4 MG tablet Take 5 tablets weekly on the day of cancer treatment. 60 tablet 3  . folic acid (FOLVITE) 1 MG tablet Take 1 mg by mouth daily.    . furosemide (LASIX) 20 MG tablet Take 20 mg by mouth 2 (two) times daily.     Marland Kitchen labetalol (NORMODYNE) 300 MG tablet Take 300 mg by mouth 2 (two) times daily.     Marland Kitchen lidocaine (LIDODERM) 5 % Place 1 patch onto the skin every 12 (twelve) hours. Remove & Discard patch within 12 hours or as directed by MD 20 patch 0  . losartan (COZAAR) 100 MG tablet Take 100 mg by mouth daily.    Marland Kitchen morphine (MS CONTIN) 30 MG 12 hr tablet Take 1 tablet (30 mg total) by mouth every 12 (twelve) hours. 60 tablet 0  . morphine (MSIR) 15 MG tablet Take 1 tablet (15 mg total) by mouth every 4 (four) hours as needed for severe pain. 60 tablet 0  . NARCAN 4 MG/0.1ML LIQD nasal spray kit Place 1 spray into the nose once.    . polyethylene glycol (MIRALAX / GLYCOLAX) 17 g packet Take 17 g by mouth daily as  needed for moderate constipation. 14 each 0  . rosuvastatin (CRESTOR) 5 MG tablet Take 5 mg by mouth daily.    Marland Kitchen senna-docusate (SENOKOT-S) 8.6-50 MG tablet Take 1 tablet by mouth at bedtime. 100 tablet 0  . Vitamin D, Ergocalciferol, (DRISDOL) 1.25 MG (50000 UNIT) CAPS capsule Take 50,000 Units by mouth every 14 (fourteen) days.     No current facility-administered medications for this visit.     Allergies:  Allergies  Allergen Reactions  . Amlodipine Swelling  . Pravastatin Other (See Comments)    Joint pain  . Zestril [Lisinopril] Other (See Comments)    Gout   ( Zestril) gout  . Other Other (See Comments)  . Oxycodone Other (See Comments)    Causes agitation        Physical Exam:  Blood pressure (!) 154/91, pulse 85, temperature 97.6 F (36.4 C), temperature source Tympanic, resp. rate 18, weight 168 lb 9.6 oz (76.5 kg), SpO2 99 %.    ECOG:  1    General appearance: Comfortable appearing without any discomfort Head: Normocephalic without any trauma Oropharynx: Mucous membranes are moist and pink without any thrush or ulcers. Eyes: Pupils are equal and round reactive to light. Lymph nodes: No cervical, supraclavicular, inguinal or axillary lymphadenopathy.   Heart:regular rate and rhythm.  S1 and S2 without leg edema. Lung: Clear without any rhonchi or wheezes.  No dullness to percussion. Abdomin: Soft, nontender, nondistended with good bowel sounds.  No hepatosplenomegaly. Musculoskeletal: No joint deformity or effusion.  Full range of motion noted. Neurological: No deficits noted on motor, sensory and deep tendon reflex exam. Skin: No petechial rash or dryness.  Appeared moist.        Lab Results: Lab Results  Component Value Date   WBC 9.1 05/01/2020   HGB 11.0 (L) 05/01/2020   HCT 32.1 (L) 05/01/2020   MCV 99.1 05/01/2020   PLT 166 05/01/2020     Chemistry      Component Value Date/Time   NA 132 (L) 05/01/2020 1215   K 4.2 05/01/2020 1215   CL 100 05/01/2020 1215   CO2 23 05/01/2020 1215   BUN 13 05/01/2020 1215   CREATININE 1.08 05/01/2020 1215      Component Value Date/Time   CALCIUM 8.9 05/01/2020 1215   ALKPHOS 79 05/01/2020 1215   AST 15 05/01/2020 1215   ALT 22 05/01/2020 1215   BILITOT 0.6 05/01/2020 1215        Results for Devin [name] (MRN [id number]) as of 05/22/2020 12:28  Ref. Range 02/11/2020 11:49 03/27/2020 13:58  IgG (Immunoglobin G), Serum Latest Ref Range: 603 - 1,613 mg/dL 415 (L) 383 (L)  IgM (Immunoglobulin M), Srm Latest Ref Range: 15 - 143 mg/dL 9 (L) 9 (L)  IgA Latest Ref Range: 61 - 437 mg/dL 2,579 (H) 1,114 (H)  Kappa free light chain Latest Ref Range: 3.3 - 19.4 mg/L 13.8 7.8   Lamda free light chains Latest Ref Range: 5.7 - 26.3 mg/L 1,318.8 (H) 237.4 (H)  Kappa, lamda light chain ratio Latest Ref Range: 0.26 - 1.65  0.01 (L) 0.03 (L)      Impression and Plan:  80 year old with:  1.  Multiple myeloma diagnosed in November 2021 after presenting with IgA lambda subtype and bone involvement.  He was found to have poor prognostic features including (14,16)  translocation.  He has tolerated Velcade and dexamethasone without any major complications.  Risks and benefits of continuing this treatment were reviewed at this time.  Alternative options including adding Revlimid treatment was also reviewed.  Protein studies in January 2022 showed decrease appropriately and these will be updated today.  Pending these results we will consider therapy escalation if his protein studies are not dropping appropriately.  He is agreeable to proceed.   2.  Compression fracture of the spine: He continues to have issues with back pain and currently on morphine.  3.  Antiemetics: Compazine is available to him at this time.  No nausea or vomiting reported.  5.  VZV prophylaxis: I recommended continuing acyclovir without any recent reactivation.  6.  Bone directed therapy: He received Zometa 4 mg total dose on February 11.  This will be repeated in 2 months.  Complications including hypocalcemia and osteonecrosis of the jaw were reviewed.  7. Follow-up: We will continue to follow weekly for Velcade and dexamethasone and MD follow-up in 4 weeks.   30  minutes were dedicated to this visit.  The time was spent on updating his disease status, reviewing laboratory data and outlining future plan of care.     Zola Button, MD 3/4/202212:28 PM

## 2020-05-22 NOTE — Telephone Encounter (Signed)
Called and spoke with patient's daughter, patient will be notified of upcoming appointments.

## 2020-05-22 NOTE — Patient Instructions (Signed)
[facility] Discharge Instructions for Patients Receiving Chemotherapy  Today you received the following chemotherapy agents: Bortezomib (Velcade)  To help prevent nausea and vomiting after your treatment, we encourage you to take your nausea medication  as prescribed.    If you develop nausea and vomiting that is not controlled by your nausea medication, call the clinic.   BELOW ARE SYMPTOMS THAT SHOULD BE REPORTED IMMEDIATELY:  *FEVER GREATER THAN 100.5 F  *CHILLS WITH OR WITHOUT FEVER  NAUSEA AND VOMITING THAT IS NOT CONTROLLED WITH YOUR NAUSEA MEDICATION  *UNUSUAL SHORTNESS OF BREATH  *UNUSUAL BRUISING OR BLEEDING  TENDERNESS IN MOUTH AND THROAT WITH OR WITHOUT PRESENCE OF ULCERS  *URINARY PROBLEMS  *BOWEL PROBLEMS  UNUSUAL RASH Items with * indicate a potential emergency and should be followed up as soon as possible.  Feel free to call the clinic should you have any questions or concerns. The clinic phone number is [phone].  Please show the CHEMO ALERT CARD at check-in to the Emergency Department and triage nurse.  Zoledronic Acid Injection (Hypercalcemia, Oncology) What is this medicine? ZOLEDRONIC ACID (ZOE le dron ik AS id) slows calcium loss from bones. It high calcium levels in the blood from some kinds of cancer. It may be used in other people at risk for bone loss. This medicine may be used for other purposes; ask your health care provider or pharmacist if you have questions. COMMON BRAND NAME(S): Zometa What should I tell my health care provider before I take this medicine? They need to know if you have any of these conditions:  cancer  dehydration  dental disease  kidney disease  liver disease  low levels of calcium in the blood  lung or breathing disease (asthma)  receiving steroids like dexamethasone or prednisone  an unusual or allergic reaction to zoledronic acid, other medicines, foods, dyes, or  preservatives  pregnant or trying to get pregnant  breast-feeding How should I use this medicine? This drug is injected into a vein. It is given by a health care provider in a hospital or clinic setting. Talk to your health care provider about the use of this drug in children. Special care may be needed. Overdosage: If you think you have taken too much of this medicine contact a poison control center or emergency room at once. NOTE: This medicine is only for you. Do not share this medicine with others. What if I miss a dose? Keep appointments for follow-up doses. It is important not to miss your dose. Call your health care provider if you are unable to keep an appointment. What may interact with this medicine?  certain antibiotics given by injection  NSAIDs, medicines for pain and inflammation, like ibuprofen or naproxen  some diuretics like bumetanide, furosemide  teriparatide  thalidomide This list may not describe all possible interactions. Give your health care provider a list of all the medicines, herbs, non-prescription drugs, or dietary supplements you use. Also tell them if you smoke, drink alcohol, or use illegal drugs. Some items may interact with your medicine. What should I watch for while using this medicine? Visit your health care provider for regular checks on your progress. It may be some time before you see the benefit from this drug. Some people who take this drug have severe bone, joint, or muscle pain. This drug may also increase your risk for jaw problems or a broken thigh bone. Tell your health care provider right away if you have severe pain in your   jaw, bones, joints, or muscles. Tell you health care provider if you have any pain that does not go away or that gets worse. Tell your dentist and dental surgeon that you are taking this drug. You should not have major dental surgery while on this drug. See your dentist to have a dental exam and fix any dental problems  before starting this drug. Take good care of your teeth while on this drug. Make sure you see your dentist for regular follow-up appointments. You should make sure you get enough calcium and vitamin D while you are taking this drug. Discuss the foods you eat and the vitamins you take with your health care provider. Check with your health care provider if you have severe diarrhea, nausea, and vomiting, or if you sweat a lot. The loss of too much body fluid may make it dangerous for you to take this drug. You may need blood work done while you are taking this drug. Do not become pregnant while taking this drug. Women should inform their health care provider if they wish to become pregnant or think they might be pregnant. There is potential for serious harm to an unborn child. Talk to your health care provider for more information. What side effects may I notice from receiving this medicine? Side effects that you should report to your doctor or health care provider as soon as possible:  allergic reactions (skin rash, itching or hives; swelling of the face, lips, or tongue)  bone pain  infection (fever, chills, cough, sore throat, pain or trouble passing urine)  jaw pain, especially after dental work  joint pain  kidney injury (trouble passing urine or change in the amount of urine)  low blood pressure (dizziness; feeling faint or lightheaded, falls; unusually weak or tired)  low calcium levels (fast heartbeat; muscle cramps or pain; pain, tingling, or numbness in the hands or feet; seizures)  low magnesium levels (fast, irregular heartbeat; muscle cramp or pain; muscle weakness; tremors; seizures)  low red blood cell counts (trouble breathing; feeling faint; lightheaded, falls; unusually weak or tired)  muscle pain  redness, blistering, peeling, or loosening of the skin, including inside the mouth  severe diarrhea  swelling of the ankles, feet, hands  trouble breathing Side effects  that usually do not require medical attention (report to your doctor or health care provider if they continue or are bothersome):  anxious  constipation  coughing  depressed mood  eye irritation, itching, or pain  fever  general ill feeling or flu-like symptoms  nausea  pain, redness, or irritation at site where injected  trouble sleeping This list may not describe all possible side effects. Call your doctor for medical advice about side effects. You may report side effects to FDA at 1-800-FDA-1088. Where should I keep my medicine? This drug is given in a hospital or clinic. It will not be stored at home. NOTE: This sheet is a summary. It may not cover all possible information. If you have questions about this medicine, talk to your doctor, pharmacist, or health care provider.  2021 Elsevier/Gold Standard (2018-12-20 09:13:00)  

## 2020-05-24 DIAGNOSIS — K4091 Unilateral inguinal hernia, without obstruction or gangrene, recurrent: Secondary | ICD-10-CM | POA: Diagnosis not present

## 2020-05-24 DIAGNOSIS — C9000 Multiple myeloma not having achieved remission: Secondary | ICD-10-CM | POA: Diagnosis not present

## 2020-05-24 DIAGNOSIS — I2510 Atherosclerotic heart disease of native coronary artery without angina pectoris: Secondary | ICD-10-CM | POA: Diagnosis not present

## 2020-05-24 DIAGNOSIS — I129 Hypertensive chronic kidney disease with stage 1 through stage 4 chronic kidney disease, or unspecified chronic kidney disease: Secondary | ICD-10-CM | POA: Diagnosis not present

## 2020-05-24 DIAGNOSIS — N183 Chronic kidney disease, stage 3 unspecified: Secondary | ICD-10-CM | POA: Diagnosis not present

## 2020-05-24 DIAGNOSIS — C649 Malignant neoplasm of unspecified kidney, except renal pelvis: Secondary | ICD-10-CM | POA: Diagnosis not present

## 2020-05-24 DIAGNOSIS — S72142D Displaced intertrochanteric fracture of left femur, subsequent encounter for closed fracture with routine healing: Secondary | ICD-10-CM | POA: Diagnosis not present

## 2020-05-24 DIAGNOSIS — E1122 Type 2 diabetes mellitus with diabetic chronic kidney disease: Secondary | ICD-10-CM | POA: Diagnosis not present

## 2020-05-24 DIAGNOSIS — I2584 Coronary atherosclerosis due to calcified coronary lesion: Secondary | ICD-10-CM | POA: Diagnosis not present

## 2020-05-25 DIAGNOSIS — I129 Hypertensive chronic kidney disease with stage 1 through stage 4 chronic kidney disease, or unspecified chronic kidney disease: Secondary | ICD-10-CM | POA: Diagnosis not present

## 2020-05-25 DIAGNOSIS — N183 Chronic kidney disease, stage 3 unspecified: Secondary | ICD-10-CM | POA: Diagnosis not present

## 2020-05-25 DIAGNOSIS — I2510 Atherosclerotic heart disease of native coronary artery without angina pectoris: Secondary | ICD-10-CM | POA: Diagnosis not present

## 2020-05-25 DIAGNOSIS — C649 Malignant neoplasm of unspecified kidney, except renal pelvis: Secondary | ICD-10-CM | POA: Diagnosis not present

## 2020-05-25 DIAGNOSIS — I2584 Coronary atherosclerosis due to calcified coronary lesion: Secondary | ICD-10-CM | POA: Diagnosis not present

## 2020-05-25 DIAGNOSIS — K4091 Unilateral inguinal hernia, without obstruction or gangrene, recurrent: Secondary | ICD-10-CM | POA: Diagnosis not present

## 2020-05-25 DIAGNOSIS — C9000 Multiple myeloma not having achieved remission: Secondary | ICD-10-CM | POA: Diagnosis not present

## 2020-05-25 DIAGNOSIS — E1122 Type 2 diabetes mellitus with diabetic chronic kidney disease: Secondary | ICD-10-CM | POA: Diagnosis not present

## 2020-05-25 DIAGNOSIS — S72142D Displaced intertrochanteric fracture of left femur, subsequent encounter for closed fracture with routine healing: Secondary | ICD-10-CM | POA: Diagnosis not present

## 2020-05-25 LAB — KAPPA/LAMBDA LIGHT CHAINS
Kappa free light chain: 10.1 mg/L (ref 3.3–19.4)
Kappa, lambda light chain ratio: 0.03 — ABNORMAL LOW (ref 0.26–1.65)
Lambda free light chains: 318.5 mg/L — ABNORMAL HIGH (ref 5.7–26.3)

## 2020-05-26 DIAGNOSIS — S72142D Displaced intertrochanteric fracture of left femur, subsequent encounter for closed fracture with routine healing: Secondary | ICD-10-CM | POA: Diagnosis not present

## 2020-05-26 DIAGNOSIS — C649 Malignant neoplasm of unspecified kidney, except renal pelvis: Secondary | ICD-10-CM | POA: Diagnosis not present

## 2020-05-26 DIAGNOSIS — N183 Chronic kidney disease, stage 3 unspecified: Secondary | ICD-10-CM | POA: Diagnosis not present

## 2020-05-26 DIAGNOSIS — K4091 Unilateral inguinal hernia, without obstruction or gangrene, recurrent: Secondary | ICD-10-CM | POA: Diagnosis not present

## 2020-05-26 DIAGNOSIS — I129 Hypertensive chronic kidney disease with stage 1 through stage 4 chronic kidney disease, or unspecified chronic kidney disease: Secondary | ICD-10-CM | POA: Diagnosis not present

## 2020-05-26 DIAGNOSIS — I2584 Coronary atherosclerosis due to calcified coronary lesion: Secondary | ICD-10-CM | POA: Diagnosis not present

## 2020-05-26 DIAGNOSIS — I2510 Atherosclerotic heart disease of native coronary artery without angina pectoris: Secondary | ICD-10-CM | POA: Diagnosis not present

## 2020-05-26 DIAGNOSIS — C9000 Multiple myeloma not having achieved remission: Secondary | ICD-10-CM | POA: Diagnosis not present

## 2020-05-26 DIAGNOSIS — E1122 Type 2 diabetes mellitus with diabetic chronic kidney disease: Secondary | ICD-10-CM | POA: Diagnosis not present

## 2020-05-27 DIAGNOSIS — I129 Hypertensive chronic kidney disease with stage 1 through stage 4 chronic kidney disease, or unspecified chronic kidney disease: Secondary | ICD-10-CM | POA: Diagnosis not present

## 2020-05-27 DIAGNOSIS — N183 Chronic kidney disease, stage 3 unspecified: Secondary | ICD-10-CM | POA: Diagnosis not present

## 2020-05-27 DIAGNOSIS — C649 Malignant neoplasm of unspecified kidney, except renal pelvis: Secondary | ICD-10-CM | POA: Diagnosis not present

## 2020-05-27 DIAGNOSIS — I2584 Coronary atherosclerosis due to calcified coronary lesion: Secondary | ICD-10-CM | POA: Diagnosis not present

## 2020-05-27 DIAGNOSIS — I2510 Atherosclerotic heart disease of native coronary artery without angina pectoris: Secondary | ICD-10-CM | POA: Diagnosis not present

## 2020-05-27 DIAGNOSIS — S72142D Displaced intertrochanteric fracture of left femur, subsequent encounter for closed fracture with routine healing: Secondary | ICD-10-CM | POA: Diagnosis not present

## 2020-05-27 DIAGNOSIS — E1122 Type 2 diabetes mellitus with diabetic chronic kidney disease: Secondary | ICD-10-CM | POA: Diagnosis not present

## 2020-05-27 DIAGNOSIS — C9000 Multiple myeloma not having achieved remission: Secondary | ICD-10-CM | POA: Diagnosis not present

## 2020-05-27 DIAGNOSIS — K4091 Unilateral inguinal hernia, without obstruction or gangrene, recurrent: Secondary | ICD-10-CM | POA: Diagnosis not present

## 2020-05-27 LAB — MULTIPLE MYELOMA PANEL, SERUM
Albumin SerPl Elph-Mcnc: 3.6 g/dL (ref 2.9–4.4)
Albumin/Glob SerPl: 1.1 (ref 0.7–1.7)
Alpha 1: 0.3 g/dL (ref 0.0–0.4)
Alpha2 Glob SerPl Elph-Mcnc: 0.8 g/dL (ref 0.4–1.0)
B-Globulin SerPl Elph-Mcnc: 0.8 g/dL (ref 0.7–1.3)
Gamma Glob SerPl Elph-Mcnc: 1.6 g/dL (ref 0.4–1.8)
Globulin, Total: 3.5 g/dL (ref 2.2–3.9)
IgA: 1521 mg/dL — ABNORMAL HIGH (ref 61–437)
IgG (Immunoglobin G), Serum: 393 mg/dL — ABNORMAL LOW (ref 603–1613)
IgM (Immunoglobulin M), Srm: 11 mg/dL — ABNORMAL LOW (ref 15–143)
M Protein SerPl Elph-Mcnc: 0.7 g/dL — ABNORMAL HIGH
Total Protein ELP: 7.1 g/dL (ref 6.0–8.5)

## 2020-05-28 DIAGNOSIS — I2510 Atherosclerotic heart disease of native coronary artery without angina pectoris: Secondary | ICD-10-CM | POA: Diagnosis not present

## 2020-05-28 DIAGNOSIS — I2584 Coronary atherosclerosis due to calcified coronary lesion: Secondary | ICD-10-CM | POA: Diagnosis not present

## 2020-05-28 DIAGNOSIS — E1122 Type 2 diabetes mellitus with diabetic chronic kidney disease: Secondary | ICD-10-CM | POA: Diagnosis not present

## 2020-05-28 DIAGNOSIS — H1131 Conjunctival hemorrhage, right eye: Secondary | ICD-10-CM | POA: Diagnosis not present

## 2020-05-28 DIAGNOSIS — K4091 Unilateral inguinal hernia, without obstruction or gangrene, recurrent: Secondary | ICD-10-CM | POA: Diagnosis not present

## 2020-05-28 DIAGNOSIS — S72142D Displaced intertrochanteric fracture of left femur, subsequent encounter for closed fracture with routine healing: Secondary | ICD-10-CM | POA: Diagnosis not present

## 2020-05-28 DIAGNOSIS — C9000 Multiple myeloma not having achieved remission: Secondary | ICD-10-CM | POA: Diagnosis not present

## 2020-05-28 DIAGNOSIS — C649 Malignant neoplasm of unspecified kidney, except renal pelvis: Secondary | ICD-10-CM | POA: Diagnosis not present

## 2020-05-28 DIAGNOSIS — I129 Hypertensive chronic kidney disease with stage 1 through stage 4 chronic kidney disease, or unspecified chronic kidney disease: Secondary | ICD-10-CM | POA: Diagnosis not present

## 2020-05-28 DIAGNOSIS — N183 Chronic kidney disease, stage 3 unspecified: Secondary | ICD-10-CM | POA: Diagnosis not present

## 2020-05-29 ENCOUNTER — Inpatient Hospital Stay: Payer: Medicare HMO

## 2020-05-29 ENCOUNTER — Ambulatory Visit: Payer: Medicare HMO

## 2020-05-29 ENCOUNTER — Other Ambulatory Visit: Payer: Self-pay

## 2020-05-29 ENCOUNTER — Other Ambulatory Visit: Payer: Medicare HMO

## 2020-05-29 VITALS — BP 142/90 | HR 84 | Temp 98.5°F | Resp 18

## 2020-05-29 DIAGNOSIS — C9000 Multiple myeloma not having achieved remission: Secondary | ICD-10-CM

## 2020-05-29 DIAGNOSIS — Z5112 Encounter for antineoplastic immunotherapy: Secondary | ICD-10-CM | POA: Diagnosis not present

## 2020-05-29 LAB — CBC WITH DIFFERENTIAL (CANCER CENTER ONLY)
Abs Immature Granulocytes: 0.06 10*3/uL (ref 0.00–0.07)
Basophils Absolute: 0 10*3/uL (ref 0.0–0.1)
Basophils Relative: 0 %
Eosinophils Absolute: 0.1 10*3/uL (ref 0.0–0.5)
Eosinophils Relative: 1 %
HCT: 34.2 % — ABNORMAL LOW (ref 39.0–52.0)
Hemoglobin: 11.5 g/dL — ABNORMAL LOW (ref 13.0–17.0)
Immature Granulocytes: 1 %
Lymphocytes Relative: 7 %
Lymphs Abs: 0.6 10*3/uL — ABNORMAL LOW (ref 0.7–4.0)
MCH: 32.6 pg (ref 26.0–34.0)
MCHC: 33.6 g/dL (ref 30.0–36.0)
MCV: 96.9 fL (ref 80.0–100.0)
Monocytes Absolute: 0.3 10*3/uL (ref 0.1–1.0)
Monocytes Relative: 4 %
Neutro Abs: 8.4 10*3/uL — ABNORMAL HIGH (ref 1.7–7.7)
Neutrophils Relative %: 87 %
Platelet Count: 197 10*3/uL (ref 150–400)
RBC: 3.53 MIL/uL — ABNORMAL LOW (ref 4.22–5.81)
RDW: 14.5 % (ref 11.5–15.5)
WBC Count: 9.5 10*3/uL (ref 4.0–10.5)
nRBC: 0 % (ref 0.0–0.2)

## 2020-05-29 LAB — CMP (CANCER CENTER ONLY)
ALT: 11 U/L (ref 0–44)
AST: 12 U/L — ABNORMAL LOW (ref 15–41)
Albumin: 3.5 g/dL (ref 3.5–5.0)
Alkaline Phosphatase: 109 U/L (ref 38–126)
Anion gap: 8 (ref 5–15)
BUN: 13 mg/dL (ref 8–23)
CO2: 22 mmol/L (ref 22–32)
Calcium: 8.7 mg/dL — ABNORMAL LOW (ref 8.9–10.3)
Chloride: 102 mmol/L (ref 98–111)
Creatinine: 1.01 mg/dL (ref 0.61–1.24)
GFR, Estimated: >60 mL/min (ref >60)
Glucose, Bld: 169 mg/dL — ABNORMAL HIGH (ref 70–99)
Potassium: 4.5 mmol/L (ref 3.5–5.1)
Sodium: 132 mmol/L — ABNORMAL LOW (ref 135–145)
Total Bilirubin: 0.7 mg/dL (ref 0.3–1.2)
Total Protein: 7.5 g/dL (ref 6.5–8.1)

## 2020-05-29 MED ORDER — SODIUM CHLORIDE 0.9 % IV SOLN
Freq: Once | INTRAVENOUS | Status: AC
  Filled 2020-05-29: qty 250

## 2020-05-29 MED ORDER — BORTEZOMIB CHEMO IV INJECTION 3.5 MG (1MG/ML-GENERIC): 2.0000 mg | Freq: Once | INTRAVENOUS | Status: DC

## 2020-05-29 MED ORDER — PROCHLORPERAZINE MALEATE 10 MG PO TABS
ORAL_TABLET | ORAL | Status: AC
  Filled 2020-05-29: qty 1

## 2020-05-29 MED ORDER — BORTEZOMIB CHEMO IV INJECTION 3.5 MG (1MG/ML-GENERIC)
1.0000 mg/m2 | Freq: Once | INTRAVENOUS | Status: AC
  Administered 2020-05-29: 2 mg via INTRAVENOUS
  Filled 2020-05-29: qty 2

## 2020-05-29 MED ORDER — PROCHLORPERAZINE MALEATE 10 MG PO TABS
10.0000 mg | ORAL_TABLET | Freq: Once | ORAL | Status: AC
  Administered 2020-05-29: 10 mg via ORAL

## 2020-05-29 NOTE — Patient Instructions (Signed)
[facility] Discharge Instructions for Patients Receiving Chemotherapy  Today you received the following chemotherapy agents: Bortezomib (Velcade)  To help prevent nausea and vomiting after your treatment, we encourage you to take your nausea medication  as prescribed.    If you develop nausea and vomiting that is not controlled by your nausea medication, call the clinic.   BELOW ARE SYMPTOMS THAT SHOULD BE REPORTED IMMEDIATELY:  *FEVER GREATER THAN 100.5 F  *CHILLS WITH OR WITHOUT FEVER  NAUSEA AND VOMITING THAT IS NOT CONTROLLED WITH YOUR NAUSEA MEDICATION  *UNUSUAL SHORTNESS OF BREATH  *UNUSUAL BRUISING OR BLEEDING  TENDERNESS IN MOUTH AND THROAT WITH OR WITHOUT PRESENCE OF ULCERS  *URINARY PROBLEMS  *BOWEL PROBLEMS  UNUSUAL RASH Items with * indicate a potential emergency and should be followed up as soon as possible.  Feel free to call the clinic should you have any questions or concerns. The clinic phone number is [phone].  Please show the CHEMO ALERT CARD at check-in to the Emergency Department and triage nurse.  Zoledronic Acid Injection (Hypercalcemia, Oncology) What is this medicine? ZOLEDRONIC ACID (ZOE le dron ik AS id) slows calcium loss from bones. It high calcium levels in the blood from some kinds of cancer. It may be used in other people at risk for bone loss. This medicine may be used for other purposes; ask your health care provider or pharmacist if you have questions. COMMON BRAND NAME(S): Zometa What should I tell my health care provider before I take this medicine? They need to know if you have any of these conditions:  cancer  dehydration  dental disease  kidney disease  liver disease  low levels of calcium in the blood  lung or breathing disease (asthma)  receiving steroids like dexamethasone or prednisone  an unusual or allergic reaction to zoledronic acid, other medicines, foods, dyes, or  preservatives  pregnant or trying to get pregnant  breast-feeding How should I use this medicine? This drug is injected into a vein. It is given by a health care provider in a hospital or clinic setting. Talk to your health care provider about the use of this drug in children. Special care may be needed. Overdosage: If you think you have taken too much of this medicine contact a poison control center or emergency room at once. NOTE: This medicine is only for you. Do not share this medicine with others. What if I miss a dose? Keep appointments for follow-up doses. It is important not to miss your dose. Call your health care provider if you are unable to keep an appointment. What may interact with this medicine?  certain antibiotics given by injection  NSAIDs, medicines for pain and inflammation, like ibuprofen or naproxen  some diuretics like bumetanide, furosemide  teriparatide  thalidomide This list may not describe all possible interactions. Give your health care provider a list of all the medicines, herbs, non-prescription drugs, or dietary supplements you use. Also tell them if you smoke, drink alcohol, or use illegal drugs. Some items may interact with your medicine. What should I watch for while using this medicine? Visit your health care provider for regular checks on your progress. It may be some time before you see the benefit from this drug. Some people who take this drug have severe bone, joint, or muscle pain. This drug may also increase your risk for jaw problems or a broken thigh bone. Tell your health care provider right away if you have severe pain in your   jaw, bones, joints, or muscles. Tell you health care provider if you have any pain that does not go away or that gets worse. Tell your dentist and dental surgeon that you are taking this drug. You should not have major dental surgery while on this drug. See your dentist to have a dental exam and fix any dental problems  before starting this drug. Take good care of your teeth while on this drug. Make sure you see your dentist for regular follow-up appointments. You should make sure you get enough calcium and vitamin D while you are taking this drug. Discuss the foods you eat and the vitamins you take with your health care provider. Check with your health care provider if you have severe diarrhea, nausea, and vomiting, or if you sweat a lot. The loss of too much body fluid may make it dangerous for you to take this drug. You may need blood work done while you are taking this drug. Do not become pregnant while taking this drug. Women should inform their health care provider if they wish to become pregnant or think they might be pregnant. There is potential for serious harm to an unborn child. Talk to your health care provider for more information. What side effects may I notice from receiving this medicine? Side effects that you should report to your doctor or health care provider as soon as possible:  allergic reactions (skin rash, itching or hives; swelling of the face, lips, or tongue)  bone pain  infection (fever, chills, cough, sore throat, pain or trouble passing urine)  jaw pain, especially after dental work  joint pain  kidney injury (trouble passing urine or change in the amount of urine)  low blood pressure (dizziness; feeling faint or lightheaded, falls; unusually weak or tired)  low calcium levels (fast heartbeat; muscle cramps or pain; pain, tingling, or numbness in the hands or feet; seizures)  low magnesium levels (fast, irregular heartbeat; muscle cramp or pain; muscle weakness; tremors; seizures)  low red blood cell counts (trouble breathing; feeling faint; lightheaded, falls; unusually weak or tired)  muscle pain  redness, blistering, peeling, or loosening of the skin, including inside the mouth  severe diarrhea  swelling of the ankles, feet, hands  trouble breathing Side effects  that usually do not require medical attention (report to your doctor or health care provider if they continue or are bothersome):  anxious  constipation  coughing  depressed mood  eye irritation, itching, or pain  fever  general ill feeling or flu-like symptoms  nausea  pain, redness, or irritation at site where injected  trouble sleeping This list may not describe all possible side effects. Call your doctor for medical advice about side effects. You may report side effects to FDA at 1-800-FDA-1088. Where should I keep my medicine? This drug is given in a hospital or clinic. It will not be stored at home. NOTE: This sheet is a summary. It may not cover all possible information. If you have questions about this medicine, talk to your doctor, pharmacist, or health care provider.  2021 Elsevier/Gold Standard (2018-12-20 09:13:00)  

## 2020-06-01 ENCOUNTER — Telehealth: Payer: Self-pay | Admitting: *Deleted

## 2020-06-01 DIAGNOSIS — E119 Type 2 diabetes mellitus without complications: Secondary | ICD-10-CM | POA: Diagnosis not present

## 2020-06-01 DIAGNOSIS — I10 Essential (primary) hypertension: Secondary | ICD-10-CM | POA: Diagnosis not present

## 2020-06-01 DIAGNOSIS — E440 Moderate protein-calorie malnutrition: Secondary | ICD-10-CM | POA: Diagnosis not present

## 2020-06-01 DIAGNOSIS — C9000 Multiple myeloma not having achieved remission: Secondary | ICD-10-CM | POA: Diagnosis not present

## 2020-06-01 DIAGNOSIS — D509 Iron deficiency anemia, unspecified: Secondary | ICD-10-CM | POA: Diagnosis not present

## 2020-06-01 NOTE — Telephone Encounter (Signed)
Received vm message from pt's wife regarding some upcoming appts. He needed lab appt added to appts on 06/19/20 and 07/02/20. He also needs his appt on 06/26/20 moved to later in the day as it takes him quite awhile to get ready due to his RA.  Scheduling message sent for 4/8/ appt change

## 2020-06-02 ENCOUNTER — Telehealth: Payer: Self-pay | Admitting: Oncology

## 2020-06-02 DIAGNOSIS — K4091 Unilateral inguinal hernia, without obstruction or gangrene, recurrent: Secondary | ICD-10-CM | POA: Diagnosis not present

## 2020-06-02 DIAGNOSIS — N183 Chronic kidney disease, stage 3 unspecified: Secondary | ICD-10-CM | POA: Diagnosis not present

## 2020-06-02 DIAGNOSIS — I129 Hypertensive chronic kidney disease with stage 1 through stage 4 chronic kidney disease, or unspecified chronic kidney disease: Secondary | ICD-10-CM | POA: Diagnosis not present

## 2020-06-02 DIAGNOSIS — C649 Malignant neoplasm of unspecified kidney, except renal pelvis: Secondary | ICD-10-CM | POA: Diagnosis not present

## 2020-06-02 DIAGNOSIS — S72142D Displaced intertrochanteric fracture of left femur, subsequent encounter for closed fracture with routine healing: Secondary | ICD-10-CM | POA: Diagnosis not present

## 2020-06-02 DIAGNOSIS — I2510 Atherosclerotic heart disease of native coronary artery without angina pectoris: Secondary | ICD-10-CM | POA: Diagnosis not present

## 2020-06-02 DIAGNOSIS — E1122 Type 2 diabetes mellitus with diabetic chronic kidney disease: Secondary | ICD-10-CM | POA: Diagnosis not present

## 2020-06-02 DIAGNOSIS — C9000 Multiple myeloma not having achieved remission: Secondary | ICD-10-CM | POA: Diagnosis not present

## 2020-06-02 DIAGNOSIS — I2584 Coronary atherosclerosis due to calcified coronary lesion: Secondary | ICD-10-CM | POA: Diagnosis not present

## 2020-06-02 NOTE — Telephone Encounter (Signed)
R/s appts per 3/14 sch msg. Pt's wife is aware.

## 2020-06-03 DIAGNOSIS — S72142D Displaced intertrochanteric fracture of left femur, subsequent encounter for closed fracture with routine healing: Secondary | ICD-10-CM | POA: Diagnosis not present

## 2020-06-03 DIAGNOSIS — I2584 Coronary atherosclerosis due to calcified coronary lesion: Secondary | ICD-10-CM | POA: Diagnosis not present

## 2020-06-03 DIAGNOSIS — K4091 Unilateral inguinal hernia, without obstruction or gangrene, recurrent: Secondary | ICD-10-CM | POA: Diagnosis not present

## 2020-06-03 DIAGNOSIS — I2510 Atherosclerotic heart disease of native coronary artery without angina pectoris: Secondary | ICD-10-CM | POA: Diagnosis not present

## 2020-06-03 DIAGNOSIS — I129 Hypertensive chronic kidney disease with stage 1 through stage 4 chronic kidney disease, or unspecified chronic kidney disease: Secondary | ICD-10-CM | POA: Diagnosis not present

## 2020-06-03 DIAGNOSIS — E1122 Type 2 diabetes mellitus with diabetic chronic kidney disease: Secondary | ICD-10-CM | POA: Diagnosis not present

## 2020-06-03 DIAGNOSIS — N183 Chronic kidney disease, stage 3 unspecified: Secondary | ICD-10-CM | POA: Diagnosis not present

## 2020-06-03 DIAGNOSIS — C9000 Multiple myeloma not having achieved remission: Secondary | ICD-10-CM | POA: Diagnosis not present

## 2020-06-03 DIAGNOSIS — C649 Malignant neoplasm of unspecified kidney, except renal pelvis: Secondary | ICD-10-CM | POA: Diagnosis not present

## 2020-06-04 DIAGNOSIS — I2584 Coronary atherosclerosis due to calcified coronary lesion: Secondary | ICD-10-CM | POA: Diagnosis not present

## 2020-06-04 DIAGNOSIS — C9000 Multiple myeloma not having achieved remission: Secondary | ICD-10-CM | POA: Diagnosis not present

## 2020-06-04 DIAGNOSIS — E1122 Type 2 diabetes mellitus with diabetic chronic kidney disease: Secondary | ICD-10-CM | POA: Diagnosis not present

## 2020-06-04 DIAGNOSIS — I129 Hypertensive chronic kidney disease with stage 1 through stage 4 chronic kidney disease, or unspecified chronic kidney disease: Secondary | ICD-10-CM | POA: Diagnosis not present

## 2020-06-04 DIAGNOSIS — C649 Malignant neoplasm of unspecified kidney, except renal pelvis: Secondary | ICD-10-CM | POA: Diagnosis not present

## 2020-06-04 DIAGNOSIS — S72142D Displaced intertrochanteric fracture of left femur, subsequent encounter for closed fracture with routine healing: Secondary | ICD-10-CM | POA: Diagnosis not present

## 2020-06-04 DIAGNOSIS — N183 Chronic kidney disease, stage 3 unspecified: Secondary | ICD-10-CM | POA: Diagnosis not present

## 2020-06-04 DIAGNOSIS — I2510 Atherosclerotic heart disease of native coronary artery without angina pectoris: Secondary | ICD-10-CM | POA: Diagnosis not present

## 2020-06-04 DIAGNOSIS — K4091 Unilateral inguinal hernia, without obstruction or gangrene, recurrent: Secondary | ICD-10-CM | POA: Diagnosis not present

## 2020-06-05 ENCOUNTER — Inpatient Hospital Stay: Payer: Medicare HMO

## 2020-06-05 ENCOUNTER — Other Ambulatory Visit: Payer: Medicare HMO

## 2020-06-05 ENCOUNTER — Ambulatory Visit: Payer: Medicare HMO

## 2020-06-05 ENCOUNTER — Other Ambulatory Visit: Payer: Self-pay

## 2020-06-05 ENCOUNTER — Encounter: Payer: Self-pay | Admitting: Oncology

## 2020-06-05 ENCOUNTER — Other Ambulatory Visit: Payer: Self-pay | Admitting: Oncology

## 2020-06-05 VITALS — BP 170/97 | HR 100 | Temp 98.4°F | Resp 17 | Wt 166.5 lb

## 2020-06-05 DIAGNOSIS — C9000 Multiple myeloma not having achieved remission: Secondary | ICD-10-CM

## 2020-06-05 DIAGNOSIS — Z5112 Encounter for antineoplastic immunotherapy: Secondary | ICD-10-CM | POA: Diagnosis not present

## 2020-06-05 LAB — CBC WITH DIFFERENTIAL (CANCER CENTER ONLY)
Abs Immature Granulocytes: 0.03 10*3/uL (ref 0.00–0.07)
Basophils Absolute: 0 10*3/uL (ref 0.0–0.1)
Basophils Relative: 0 %
Eosinophils Absolute: 0 10*3/uL (ref 0.0–0.5)
Eosinophils Relative: 0 %
HCT: 35.8 % — ABNORMAL LOW (ref 39.0–52.0)
Hemoglobin: 11.9 g/dL — ABNORMAL LOW (ref 13.0–17.0)
Immature Granulocytes: 0 %
Lymphocytes Relative: 5 %
Lymphs Abs: 0.6 10*3/uL — ABNORMAL LOW (ref 0.7–4.0)
MCH: 32.4 pg (ref 26.0–34.0)
MCHC: 33.2 g/dL (ref 30.0–36.0)
MCV: 97.5 fL (ref 80.0–100.0)
Monocytes Absolute: 0.1 10*3/uL (ref 0.1–1.0)
Monocytes Relative: 1 %
Neutro Abs: 10.3 10*3/uL — ABNORMAL HIGH (ref 1.7–7.7)
Neutrophils Relative %: 94 %
Platelet Count: 174 10*3/uL (ref 150–400)
RBC: 3.67 MIL/uL — ABNORMAL LOW (ref 4.22–5.81)
RDW: 14.2 % (ref 11.5–15.5)
WBC Count: 11.1 10*3/uL — ABNORMAL HIGH (ref 4.0–10.5)
nRBC: 0 % (ref 0.0–0.2)

## 2020-06-05 LAB — CMP (CANCER CENTER ONLY)
ALT: 13 U/L (ref 0–44)
AST: 11 U/L — ABNORMAL LOW (ref 15–41)
Albumin: 3.4 g/dL — ABNORMAL LOW (ref 3.5–5.0)
Alkaline Phosphatase: 99 U/L (ref 38–126)
Anion gap: 10 (ref 5–15)
BUN: 12 mg/dL (ref 8–23)
CO2: 22 mmol/L (ref 22–32)
Calcium: 8.6 mg/dL — ABNORMAL LOW (ref 8.9–10.3)
Chloride: 102 mmol/L (ref 98–111)
Creatinine: 0.95 mg/dL (ref 0.61–1.24)
GFR, Estimated: >60 mL/min (ref >60)
Glucose, Bld: 177 mg/dL — ABNORMAL HIGH (ref 70–99)
Potassium: 4.3 mmol/L (ref 3.5–5.1)
Sodium: 134 mmol/L — ABNORMAL LOW (ref 135–145)
Total Bilirubin: 0.4 mg/dL (ref 0.3–1.2)
Total Protein: 7.4 g/dL (ref 6.5–8.1)

## 2020-06-05 MED ORDER — NON FORMULARY: 1.0000 mg/m2 | Freq: Once | Status: DC

## 2020-06-05 MED ORDER — MORPHINE SULFATE ER 30 MG PO TBCR: 30.0000 mg | EXTENDED_RELEASE_TABLET | Freq: Two times a day (BID) | ORAL | 0 refills | Status: DC

## 2020-06-05 MED ORDER — SODIUM CHLORIDE 0.9 % IV SOLN
Freq: Once | INTRAVENOUS | Status: AC
  Filled 2020-06-05: qty 250

## 2020-06-05 MED ORDER — PROCHLORPERAZINE MALEATE 10 MG PO TABS
10.0000 mg | ORAL_TABLET | Freq: Once | ORAL | Status: AC
  Administered 2020-06-05: 10 mg via ORAL

## 2020-06-05 MED ORDER — BORTEZOMIB CHEMO IV INJECTION 3.5 MG (1MG/ML-GENERIC)
2.0000 mg | Freq: Once | INTRAVENOUS | Status: AC
  Administered 2020-06-05: 2 mg via INTRAVENOUS
  Filled 2020-06-05: qty 2

## 2020-06-05 MED ORDER — PROCHLORPERAZINE MALEATE 10 MG PO TABS
ORAL_TABLET | ORAL | Status: AC
  Filled 2020-06-05: qty 1

## 2020-06-05 NOTE — Patient Instructions (Signed)
[facility] Discharge Instructions for Patients Receiving Chemotherapy  Today you received the following chemotherapy agents: bortezomib.  To help prevent nausea and vomiting after your treatment, we encourage you to take your nausea medication as directed.   If you develop nausea and vomiting that is not controlled by your nausea medication, call the clinic.   BELOW ARE SYMPTOMS THAT SHOULD BE REPORTED IMMEDIATELY:  *FEVER GREATER THAN 100.5 F  *CHILLS WITH OR WITHOUT FEVER  NAUSEA AND VOMITING THAT IS NOT CONTROLLED WITH YOUR NAUSEA MEDICATION  *UNUSUAL SHORTNESS OF BREATH  *UNUSUAL BRUISING OR BLEEDING  TENDERNESS IN MOUTH AND THROAT WITH OR WITHOUT PRESENCE OF ULCERS  *URINARY PROBLEMS  *BOWEL PROBLEMS  UNUSUAL RASH Items with * indicate a potential emergency and should be followed up as soon as possible.  Feel free to call the clinic should you have any questions or concerns. The clinic phone number is [phone].  Please show the CHEMO ALERT CARD at check-in to the Emergency Department and triage nurse.   

## 2020-06-08 DIAGNOSIS — I2584 Coronary atherosclerosis due to calcified coronary lesion: Secondary | ICD-10-CM | POA: Diagnosis not present

## 2020-06-08 DIAGNOSIS — N183 Chronic kidney disease, stage 3 unspecified: Secondary | ICD-10-CM | POA: Diagnosis not present

## 2020-06-08 DIAGNOSIS — K4091 Unilateral inguinal hernia, without obstruction or gangrene, recurrent: Secondary | ICD-10-CM | POA: Diagnosis not present

## 2020-06-08 DIAGNOSIS — S72142D Displaced intertrochanteric fracture of left femur, subsequent encounter for closed fracture with routine healing: Secondary | ICD-10-CM | POA: Diagnosis not present

## 2020-06-08 DIAGNOSIS — C649 Malignant neoplasm of unspecified kidney, except renal pelvis: Secondary | ICD-10-CM | POA: Diagnosis not present

## 2020-06-08 DIAGNOSIS — C9000 Multiple myeloma not having achieved remission: Secondary | ICD-10-CM | POA: Diagnosis not present

## 2020-06-08 DIAGNOSIS — I2510 Atherosclerotic heart disease of native coronary artery without angina pectoris: Secondary | ICD-10-CM | POA: Diagnosis not present

## 2020-06-08 DIAGNOSIS — E1122 Type 2 diabetes mellitus with diabetic chronic kidney disease: Secondary | ICD-10-CM | POA: Diagnosis not present

## 2020-06-08 DIAGNOSIS — I129 Hypertensive chronic kidney disease with stage 1 through stage 4 chronic kidney disease, or unspecified chronic kidney disease: Secondary | ICD-10-CM | POA: Diagnosis not present

## 2020-06-10 DIAGNOSIS — I2510 Atherosclerotic heart disease of native coronary artery without angina pectoris: Secondary | ICD-10-CM | POA: Diagnosis not present

## 2020-06-10 DIAGNOSIS — S72142D Displaced intertrochanteric fracture of left femur, subsequent encounter for closed fracture with routine healing: Secondary | ICD-10-CM | POA: Diagnosis not present

## 2020-06-10 DIAGNOSIS — N183 Chronic kidney disease, stage 3 unspecified: Secondary | ICD-10-CM | POA: Diagnosis not present

## 2020-06-10 DIAGNOSIS — E1122 Type 2 diabetes mellitus with diabetic chronic kidney disease: Secondary | ICD-10-CM | POA: Diagnosis not present

## 2020-06-10 DIAGNOSIS — C9000 Multiple myeloma not having achieved remission: Secondary | ICD-10-CM | POA: Diagnosis not present

## 2020-06-10 DIAGNOSIS — C649 Malignant neoplasm of unspecified kidney, except renal pelvis: Secondary | ICD-10-CM | POA: Diagnosis not present

## 2020-06-10 DIAGNOSIS — I2584 Coronary atherosclerosis due to calcified coronary lesion: Secondary | ICD-10-CM | POA: Diagnosis not present

## 2020-06-10 DIAGNOSIS — K4091 Unilateral inguinal hernia, without obstruction or gangrene, recurrent: Secondary | ICD-10-CM | POA: Diagnosis not present

## 2020-06-10 DIAGNOSIS — I129 Hypertensive chronic kidney disease with stage 1 through stage 4 chronic kidney disease, or unspecified chronic kidney disease: Secondary | ICD-10-CM | POA: Diagnosis not present

## 2020-06-11 DIAGNOSIS — I129 Hypertensive chronic kidney disease with stage 1 through stage 4 chronic kidney disease, or unspecified chronic kidney disease: Secondary | ICD-10-CM | POA: Diagnosis not present

## 2020-06-11 DIAGNOSIS — C649 Malignant neoplasm of unspecified kidney, except renal pelvis: Secondary | ICD-10-CM | POA: Diagnosis not present

## 2020-06-11 DIAGNOSIS — S72142D Displaced intertrochanteric fracture of left femur, subsequent encounter for closed fracture with routine healing: Secondary | ICD-10-CM | POA: Diagnosis not present

## 2020-06-11 DIAGNOSIS — E1122 Type 2 diabetes mellitus with diabetic chronic kidney disease: Secondary | ICD-10-CM | POA: Diagnosis not present

## 2020-06-11 DIAGNOSIS — K4091 Unilateral inguinal hernia, without obstruction or gangrene, recurrent: Secondary | ICD-10-CM | POA: Diagnosis not present

## 2020-06-11 DIAGNOSIS — I2510 Atherosclerotic heart disease of native coronary artery without angina pectoris: Secondary | ICD-10-CM | POA: Diagnosis not present

## 2020-06-11 DIAGNOSIS — I2584 Coronary atherosclerosis due to calcified coronary lesion: Secondary | ICD-10-CM | POA: Diagnosis not present

## 2020-06-11 DIAGNOSIS — N183 Chronic kidney disease, stage 3 unspecified: Secondary | ICD-10-CM | POA: Diagnosis not present

## 2020-06-11 DIAGNOSIS — C9000 Multiple myeloma not having achieved remission: Secondary | ICD-10-CM | POA: Diagnosis not present

## 2020-06-12 ENCOUNTER — Inpatient Hospital Stay: Payer: Medicare HMO

## 2020-06-12 ENCOUNTER — Other Ambulatory Visit: Payer: Medicare HMO

## 2020-06-12 ENCOUNTER — Ambulatory Visit: Payer: Medicare HMO

## 2020-06-12 ENCOUNTER — Other Ambulatory Visit: Payer: Self-pay

## 2020-06-12 VITALS — BP 164/94 | HR 97 | Temp 97.9°F | Resp 16

## 2020-06-12 DIAGNOSIS — C9000 Multiple myeloma not having achieved remission: Secondary | ICD-10-CM

## 2020-06-12 DIAGNOSIS — Z5112 Encounter for antineoplastic immunotherapy: Secondary | ICD-10-CM | POA: Diagnosis not present

## 2020-06-12 LAB — CBC WITH DIFFERENTIAL (CANCER CENTER ONLY)
Abs Immature Granulocytes: 0.04 10*3/uL (ref 0.00–0.07)
Basophils Absolute: 0 10*3/uL (ref 0.0–0.1)
Basophils Relative: 0 %
Eosinophils Absolute: 0 10*3/uL (ref 0.0–0.5)
Eosinophils Relative: 0 %
HCT: 38.3 % — ABNORMAL LOW (ref 39.0–52.0)
Hemoglobin: 12.7 g/dL — ABNORMAL LOW (ref 13.0–17.0)
Immature Granulocytes: 0 %
Lymphocytes Relative: 8 %
Lymphs Abs: 0.7 10*3/uL (ref 0.7–4.0)
MCH: 31.6 pg (ref 26.0–34.0)
MCHC: 33.2 g/dL (ref 30.0–36.0)
MCV: 95.3 fL (ref 80.0–100.0)
Monocytes Absolute: 0.1 10*3/uL (ref 0.1–1.0)
Monocytes Relative: 1 %
Neutro Abs: 8.8 10*3/uL — ABNORMAL HIGH (ref 1.7–7.7)
Neutrophils Relative %: 91 %
Platelet Count: 166 10*3/uL (ref 150–400)
RBC: 4.02 MIL/uL — ABNORMAL LOW (ref 4.22–5.81)
RDW: 13.8 % (ref 11.5–15.5)
WBC Count: 9.7 10*3/uL (ref 4.0–10.5)
nRBC: 0 % (ref 0.0–0.2)

## 2020-06-12 LAB — CMP (CANCER CENTER ONLY)
ALT: 13 U/L (ref 0–44)
AST: 11 U/L — ABNORMAL LOW (ref 15–41)
Albumin: 3.7 g/dL (ref 3.5–5.0)
Alkaline Phosphatase: 84 U/L (ref 38–126)
Anion gap: 14 (ref 5–15)
BUN: 10 mg/dL (ref 8–23)
CO2: 20 mmol/L — ABNORMAL LOW (ref 22–32)
Calcium: 8.7 mg/dL — ABNORMAL LOW (ref 8.9–10.3)
Chloride: 101 mmol/L (ref 98–111)
Creatinine: 1.04 mg/dL (ref 0.61–1.24)
GFR, Estimated: >60 mL/min (ref >60)
Glucose, Bld: 249 mg/dL — ABNORMAL HIGH (ref 70–99)
Potassium: 4.2 mmol/L (ref 3.5–5.1)
Sodium: 135 mmol/L (ref 135–145)
Total Bilirubin: 0.5 mg/dL (ref 0.3–1.2)
Total Protein: 7.9 g/dL (ref 6.5–8.1)

## 2020-06-12 MED ORDER — BORTEZOMIB CHEMO IV INJECTION 3.5 MG (1MG/ML-GENERIC)
1.0000 mg/m2 | Freq: Once | INTRAVENOUS | Status: AC
  Administered 2020-06-12: 2 mg via INTRAVENOUS
  Filled 2020-06-12: qty 2

## 2020-06-12 MED ORDER — SODIUM CHLORIDE 0.9 % IV SOLN
Freq: Once | INTRAVENOUS | Status: AC
  Filled 2020-06-12: qty 250

## 2020-06-12 MED ORDER — PROCHLORPERAZINE MALEATE 10 MG PO TABS
ORAL_TABLET | ORAL | Status: AC
  Filled 2020-06-12: qty 1

## 2020-06-12 MED ORDER — PROCHLORPERAZINE MALEATE 10 MG PO TABS
10.0000 mg | ORAL_TABLET | Freq: Once | ORAL | Status: AC
  Administered 2020-06-12: 10 mg via ORAL

## 2020-06-12 NOTE — Patient Instructions (Signed)
[facility] Discharge Instructions for Patients Receiving Chemotherapy  Today you received the following chemotherapy agents: bortezomib.  To help prevent nausea and vomiting after your treatment, we encourage you to take your nausea medication as directed.   If you develop nausea and vomiting that is not controlled by your nausea medication, call the clinic.   BELOW ARE SYMPTOMS THAT SHOULD BE REPORTED IMMEDIATELY:  *FEVER GREATER THAN 100.5 F  *CHILLS WITH OR WITHOUT FEVER  NAUSEA AND VOMITING THAT IS NOT CONTROLLED WITH YOUR NAUSEA MEDICATION  *UNUSUAL SHORTNESS OF BREATH  *UNUSUAL BRUISING OR BLEEDING  TENDERNESS IN MOUTH AND THROAT WITH OR WITHOUT PRESENCE OF ULCERS  *URINARY PROBLEMS  *BOWEL PROBLEMS  UNUSUAL RASH Items with * indicate a potential emergency and should be followed up as soon as possible.  Feel free to call the clinic should you have any questions or concerns. The clinic phone number is [phone].  Please show the CHEMO ALERT CARD at check-in to the Emergency Department and triage nurse.   

## 2020-06-15 DIAGNOSIS — Z4789 Encounter for other orthopedic aftercare: Secondary | ICD-10-CM | POA: Diagnosis not present

## 2020-06-16 DIAGNOSIS — S72142D Displaced intertrochanteric fracture of left femur, subsequent encounter for closed fracture with routine healing: Secondary | ICD-10-CM | POA: Diagnosis not present

## 2020-06-16 DIAGNOSIS — N183 Chronic kidney disease, stage 3 unspecified: Secondary | ICD-10-CM | POA: Diagnosis not present

## 2020-06-16 DIAGNOSIS — I2584 Coronary atherosclerosis due to calcified coronary lesion: Secondary | ICD-10-CM | POA: Diagnosis not present

## 2020-06-16 DIAGNOSIS — I2510 Atherosclerotic heart disease of native coronary artery without angina pectoris: Secondary | ICD-10-CM | POA: Diagnosis not present

## 2020-06-16 DIAGNOSIS — I129 Hypertensive chronic kidney disease with stage 1 through stage 4 chronic kidney disease, or unspecified chronic kidney disease: Secondary | ICD-10-CM | POA: Diagnosis not present

## 2020-06-16 DIAGNOSIS — C649 Malignant neoplasm of unspecified kidney, except renal pelvis: Secondary | ICD-10-CM | POA: Diagnosis not present

## 2020-06-16 DIAGNOSIS — K4091 Unilateral inguinal hernia, without obstruction or gangrene, recurrent: Secondary | ICD-10-CM | POA: Diagnosis not present

## 2020-06-16 DIAGNOSIS — E1122 Type 2 diabetes mellitus with diabetic chronic kidney disease: Secondary | ICD-10-CM | POA: Diagnosis not present

## 2020-06-16 DIAGNOSIS — C9000 Multiple myeloma not having achieved remission: Secondary | ICD-10-CM | POA: Diagnosis not present

## 2020-06-17 ENCOUNTER — Telehealth: Payer: Self-pay | Admitting: Oncology

## 2020-06-17 ENCOUNTER — Other Ambulatory Visit: Payer: Self-pay | Admitting: Oncology

## 2020-06-17 DIAGNOSIS — C9000 Multiple myeloma not having achieved remission: Secondary | ICD-10-CM | POA: Diagnosis not present

## 2020-06-17 DIAGNOSIS — C649 Malignant neoplasm of unspecified kidney, except renal pelvis: Secondary | ICD-10-CM | POA: Diagnosis not present

## 2020-06-17 DIAGNOSIS — I2584 Coronary atherosclerosis due to calcified coronary lesion: Secondary | ICD-10-CM | POA: Diagnosis not present

## 2020-06-17 DIAGNOSIS — N183 Chronic kidney disease, stage 3 unspecified: Secondary | ICD-10-CM | POA: Diagnosis not present

## 2020-06-17 DIAGNOSIS — I129 Hypertensive chronic kidney disease with stage 1 through stage 4 chronic kidney disease, or unspecified chronic kidney disease: Secondary | ICD-10-CM | POA: Diagnosis not present

## 2020-06-17 DIAGNOSIS — E1122 Type 2 diabetes mellitus with diabetic chronic kidney disease: Secondary | ICD-10-CM | POA: Diagnosis not present

## 2020-06-17 DIAGNOSIS — I2510 Atherosclerotic heart disease of native coronary artery without angina pectoris: Secondary | ICD-10-CM | POA: Diagnosis not present

## 2020-06-17 DIAGNOSIS — K4091 Unilateral inguinal hernia, without obstruction or gangrene, recurrent: Secondary | ICD-10-CM | POA: Diagnosis not present

## 2020-06-17 DIAGNOSIS — S72142D Displaced intertrochanteric fracture of left femur, subsequent encounter for closed fracture with routine healing: Secondary | ICD-10-CM | POA: Diagnosis not present

## 2020-06-17 NOTE — Telephone Encounter (Signed)
Scheduled appt per 3/30 sch msg. Pt's wife is aware.  

## 2020-06-18 DIAGNOSIS — I129 Hypertensive chronic kidney disease with stage 1 through stage 4 chronic kidney disease, or unspecified chronic kidney disease: Secondary | ICD-10-CM | POA: Diagnosis not present

## 2020-06-18 DIAGNOSIS — E1122 Type 2 diabetes mellitus with diabetic chronic kidney disease: Secondary | ICD-10-CM | POA: Diagnosis not present

## 2020-06-18 DIAGNOSIS — I2584 Coronary atherosclerosis due to calcified coronary lesion: Secondary | ICD-10-CM | POA: Diagnosis not present

## 2020-06-18 DIAGNOSIS — S72142D Displaced intertrochanteric fracture of left femur, subsequent encounter for closed fracture with routine healing: Secondary | ICD-10-CM | POA: Diagnosis not present

## 2020-06-18 DIAGNOSIS — C9000 Multiple myeloma not having achieved remission: Secondary | ICD-10-CM | POA: Diagnosis not present

## 2020-06-18 DIAGNOSIS — C649 Malignant neoplasm of unspecified kidney, except renal pelvis: Secondary | ICD-10-CM | POA: Diagnosis not present

## 2020-06-18 DIAGNOSIS — I2510 Atherosclerotic heart disease of native coronary artery without angina pectoris: Secondary | ICD-10-CM | POA: Diagnosis not present

## 2020-06-18 DIAGNOSIS — N183 Chronic kidney disease, stage 3 unspecified: Secondary | ICD-10-CM | POA: Diagnosis not present

## 2020-06-18 DIAGNOSIS — K4091 Unilateral inguinal hernia, without obstruction or gangrene, recurrent: Secondary | ICD-10-CM | POA: Diagnosis not present

## 2020-06-19 ENCOUNTER — Inpatient Hospital Stay: Payer: Medicare HMO | Attending: Oncology

## 2020-06-19 ENCOUNTER — Inpatient Hospital Stay: Payer: Medicare HMO

## 2020-06-19 ENCOUNTER — Other Ambulatory Visit: Payer: Self-pay

## 2020-06-19 VITALS — BP 146/90 | HR 84 | Temp 98.1°F | Resp 16 | Wt 172.0 lb

## 2020-06-19 DIAGNOSIS — C9000 Multiple myeloma not having achieved remission: Secondary | ICD-10-CM | POA: Insufficient documentation

## 2020-06-19 DIAGNOSIS — Z5112 Encounter for antineoplastic immunotherapy: Secondary | ICD-10-CM | POA: Insufficient documentation

## 2020-06-19 LAB — CMP (CANCER CENTER ONLY)
ALT: 11 U/L (ref 0–44)
AST: 11 U/L — ABNORMAL LOW (ref 15–41)
Albumin: 3.5 g/dL (ref 3.5–5.0)
Alkaline Phosphatase: 68 U/L (ref 38–126)
Anion gap: 14 (ref 5–15)
BUN: 13 mg/dL (ref 8–23)
CO2: 20 mmol/L — ABNORMAL LOW (ref 22–32)
Calcium: 8.4 mg/dL — ABNORMAL LOW (ref 8.9–10.3)
Chloride: 101 mmol/L (ref 98–111)
Creatinine: 1.03 mg/dL (ref 0.61–1.24)
GFR, Estimated: >60 mL/min (ref >60)
Glucose, Bld: 216 mg/dL — ABNORMAL HIGH (ref 70–99)
Potassium: 4.2 mmol/L (ref 3.5–5.1)
Sodium: 135 mmol/L (ref 135–145)
Total Bilirubin: 0.4 mg/dL (ref 0.3–1.2)
Total Protein: 7.2 g/dL (ref 6.5–8.1)

## 2020-06-19 LAB — CBC WITH DIFFERENTIAL (CANCER CENTER ONLY)
Abs Immature Granulocytes: 0.02 10*3/uL (ref 0.00–0.07)
Basophils Absolute: 0 10*3/uL (ref 0.0–0.1)
Basophils Relative: 0 %
Eosinophils Absolute: 0 10*3/uL (ref 0.0–0.5)
Eosinophils Relative: 0 %
HCT: 36.7 % — ABNORMAL LOW (ref 39.0–52.0)
Hemoglobin: 12.2 g/dL — ABNORMAL LOW (ref 13.0–17.0)
Immature Granulocytes: 0 %
Lymphocytes Relative: 5 %
Lymphs Abs: 0.5 10*3/uL — ABNORMAL LOW (ref 0.7–4.0)
MCH: 31.7 pg (ref 26.0–34.0)
MCHC: 33.2 g/dL (ref 30.0–36.0)
MCV: 95.3 fL (ref 80.0–100.0)
Monocytes Absolute: 0.1 10*3/uL (ref 0.1–1.0)
Monocytes Relative: 1 %
Neutro Abs: 8 10*3/uL — ABNORMAL HIGH (ref 1.7–7.7)
Neutrophils Relative %: 94 %
Platelet Count: 145 10*3/uL — ABNORMAL LOW (ref 150–400)
RBC: 3.85 MIL/uL — ABNORMAL LOW (ref 4.22–5.81)
RDW: 14 % (ref 11.5–15.5)
WBC Count: 8.6 10*3/uL (ref 4.0–10.5)
nRBC: 0 % (ref 0.0–0.2)

## 2020-06-19 MED ORDER — BORTEZOMIB CHEMO IV INJECTION 3.5 MG (1MG/ML-GENERIC)
1.0000 mg/m2 | Freq: Once | INTRAVENOUS | Status: AC
  Administered 2020-06-19: 2 mg via INTRAVENOUS
  Filled 2020-06-19: qty 2

## 2020-06-19 MED ORDER — PROCHLORPERAZINE MALEATE 10 MG PO TABS
10.0000 mg | ORAL_TABLET | Freq: Once | ORAL | Status: AC
Start: 2020-06-19 — End: 2020-06-19
  Administered 2020-06-19: 10 mg via ORAL

## 2020-06-19 MED ORDER — PROCHLORPERAZINE MALEATE 10 MG PO TABS
ORAL_TABLET | ORAL | Status: AC
  Filled 2020-06-19: qty 1

## 2020-06-19 MED ORDER — SODIUM CHLORIDE 0.9 % IV SOLN
Freq: Once | INTRAVENOUS | Status: AC
  Filled 2020-06-19: qty 250

## 2020-06-19 NOTE — Addendum Note (Signed)
Addended by: Tora Kindred on: 06/19/2020 10:27 AM   Modules accepted: Orders

## 2020-06-19 NOTE — Patient Instructions (Signed)
[facility] Discharge Instructions for Patients Receiving Chemotherapy  Today you received the following chemotherapy agents: Bortezomib (Velcade)  To help prevent nausea and vomiting after your treatment, we encourage you to take your nausea medication  as prescribed.    If you develop nausea and vomiting that is not controlled by your nausea medication, call the clinic.   BELOW ARE SYMPTOMS THAT SHOULD BE REPORTED IMMEDIATELY:  *FEVER GREATER THAN 100.5 F  *CHILLS WITH OR WITHOUT FEVER  NAUSEA AND VOMITING THAT IS NOT CONTROLLED WITH YOUR NAUSEA MEDICATION  *UNUSUAL SHORTNESS OF BREATH  *UNUSUAL BRUISING OR BLEEDING  TENDERNESS IN MOUTH AND THROAT WITH OR WITHOUT PRESENCE OF ULCERS  *URINARY PROBLEMS  *BOWEL PROBLEMS  UNUSUAL RASH Items with * indicate a potential emergency and should be followed up as soon as possible.  Feel free to call the clinic should you have any questions or concerns. The clinic phone number is [phone].  Please show the [place] at check-in to the Emergency Department and triage nurse.  Zoledronic Acid Injection (Hypercalcemia, Oncology) What is this medicine? ZOLEDRONIC ACID (ZOE le dron ik AS id) slows calcium loss from bones. It high calcium levels in the blood from some kinds of cancer. It may be used in other people at risk for bone loss. This medicine may be used for other purposes; ask your health care provider or pharmacist if you have questions. COMMON BRAND NAME(S): Zometa What should I tell my health care provider before I take this medicine? They need to know if you have any of these conditions:  cancer  dehydration  dental disease  kidney disease  liver disease  low levels of calcium in the blood  lung or breathing disease (asthma)  receiving steroids like dexamethasone or prednisone  an unusual or allergic reaction to zoledronic acid, other medicines, foods, dyes, or  preservatives  pregnant or trying to get pregnant  breast-feeding How should I use this medicine? This drug is injected into a vein. It is given by a health care provider in a hospital or clinic setting. Talk to your health care provider about the use of this drug in children. Special care may be needed. Overdosage: If you think you have taken too much of this medicine contact a poison control center or emergency room at once. NOTE: This medicine is only for you. Do not share this medicine with others. What if I miss a dose? Keep appointments for follow-up doses. It is important not to miss your dose. Call your health care provider if you are unable to keep an appointment. What may interact with this medicine?  certain antibiotics given by injection  NSAIDs, medicines for pain and inflammation, like ibuprofen or naproxen  some diuretics like bumetanide, furosemide  teriparatide  thalidomide This list may not describe all possible interactions. Give your health care provider a list of all the medicines, herbs, non-prescription drugs, or dietary supplements you use. Also tell them if you smoke, drink alcohol, or use illegal drugs. Some items may interact with your medicine. What should I watch for while using this medicine? Visit your health care provider for regular checks on your progress. It may be some time before you see the benefit from this drug. Some people who take this drug have severe bone, joint, or muscle pain. This drug may also increase your risk for jaw problems or a broken thigh bone. Tell your health care provider right away if you have severe pain in your  jaw, bones, joints, or muscles. Tell you health care provider if you have any pain that does not go away or that gets worse. Tell your dentist and dental surgeon that you are taking this drug. You should not have major dental surgery while on this drug. See your dentist to have a dental exam and fix any dental problems  before starting this drug. Take good care of your teeth while on this drug. Make sure you see your dentist for regular follow-up appointments. You should make sure you get enough calcium and vitamin D while you are taking this drug. Discuss the foods you eat and the vitamins you take with your health care provider. Check with your health care provider if you have severe diarrhea, nausea, and vomiting, or if you sweat a lot. The loss of too much body fluid may make it dangerous for you to take this drug. You may need blood work done while you are taking this drug. Do not become pregnant while taking this drug. Women should inform their health care provider if they wish to become pregnant or think they might be pregnant. There is potential for serious harm to an unborn child. Talk to your health care provider for more information. What side effects may I notice from receiving this medicine? Side effects that you should report to your doctor or health care provider as soon as possible:  allergic reactions (skin rash, itching or hives; swelling of the face, lips, or tongue)  bone pain  infection (fever, chills, cough, sore throat, pain or trouble passing urine)  jaw pain, especially after dental work  joint pain  kidney injury (trouble passing urine or change in the amount of urine)  low blood pressure (dizziness; feeling faint or lightheaded, falls; unusually weak or tired)  low calcium levels (fast heartbeat; muscle cramps or pain; pain, tingling, or numbness in the hands or feet; seizures)  low magnesium levels (fast, irregular heartbeat; muscle cramp or pain; muscle weakness; tremors; seizures)  low red blood cell counts (trouble breathing; feeling faint; lightheaded, falls; unusually weak or tired)  muscle pain  redness, blistering, peeling, or loosening of the skin, including inside the mouth  severe diarrhea  swelling of the ankles, feet, hands  trouble breathing Side effects  that usually do not require medical attention (report to your doctor or health care provider if they continue or are bothersome):  anxious  constipation  coughing  depressed mood  eye irritation, itching, or pain  fever  general ill feeling or flu-like symptoms  nausea  pain, redness, or irritation at site where injected  trouble sleeping This list may not describe all possible side effects. Call your doctor for medical advice about side effects. You may report side effects to FDA at 1-800-FDA-1088. Where should I keep my medicine? This drug is given in a hospital or clinic. It will not be stored at home. NOTE: This sheet is a summary. It may not cover all possible information. If you have questions about this medicine, talk to your doctor, pharmacist, or health care provider.  2021 Elsevier/Gold Standard (2018-12-20 09:13:00)

## 2020-06-23 DIAGNOSIS — N183 Chronic kidney disease, stage 3 unspecified: Secondary | ICD-10-CM | POA: Diagnosis not present

## 2020-06-23 DIAGNOSIS — I129 Hypertensive chronic kidney disease with stage 1 through stage 4 chronic kidney disease, or unspecified chronic kidney disease: Secondary | ICD-10-CM | POA: Diagnosis not present

## 2020-06-23 DIAGNOSIS — S72142D Displaced intertrochanteric fracture of left femur, subsequent encounter for closed fracture with routine healing: Secondary | ICD-10-CM | POA: Diagnosis not present

## 2020-06-23 DIAGNOSIS — I2510 Atherosclerotic heart disease of native coronary artery without angina pectoris: Secondary | ICD-10-CM | POA: Diagnosis not present

## 2020-06-23 DIAGNOSIS — I2584 Coronary atherosclerosis due to calcified coronary lesion: Secondary | ICD-10-CM | POA: Diagnosis not present

## 2020-06-23 DIAGNOSIS — K4091 Unilateral inguinal hernia, without obstruction or gangrene, recurrent: Secondary | ICD-10-CM | POA: Diagnosis not present

## 2020-06-23 DIAGNOSIS — E1122 Type 2 diabetes mellitus with diabetic chronic kidney disease: Secondary | ICD-10-CM | POA: Diagnosis not present

## 2020-06-23 DIAGNOSIS — C649 Malignant neoplasm of unspecified kidney, except renal pelvis: Secondary | ICD-10-CM | POA: Diagnosis not present

## 2020-06-23 DIAGNOSIS — C9000 Multiple myeloma not having achieved remission: Secondary | ICD-10-CM | POA: Diagnosis not present

## 2020-06-24 ENCOUNTER — Telehealth: Payer: Self-pay | Admitting: *Deleted

## 2020-06-24 NOTE — Telephone Encounter (Signed)
-----   Message from Wyatt Portela, MD sent at 06/24/2020 12:59 PM EDT ----- Regarding: RE: Patient questions Ok to have booster and have mole removed. Thanks ----- Message ----- From: Rolene Course, RN Sent: 06/24/2020  12:49 PM EDT To: Wyatt Portela, MD Subject: Patient questions                              This patient's wife called with a couple of questions.  They are asking if he can have a second covid booster even though he is currently in treatment.  Also, he has a skin tag or mole which a dermatologist is recommending that it be removed. They are asking if this should be done now or wait until his tx is finished?  Please advise, thanks.

## 2020-06-24 NOTE — Telephone Encounter (Signed)
Returned call to patient's wife, advised her of Dr. Hazeline Junker response, ok to proceed with second covid booster and mole/skin tag removal during cancer tx.  She verbalizes understanding.

## 2020-06-25 DIAGNOSIS — C9000 Multiple myeloma not having achieved remission: Secondary | ICD-10-CM | POA: Diagnosis not present

## 2020-06-25 DIAGNOSIS — I2510 Atherosclerotic heart disease of native coronary artery without angina pectoris: Secondary | ICD-10-CM | POA: Diagnosis not present

## 2020-06-25 DIAGNOSIS — S72142D Displaced intertrochanteric fracture of left femur, subsequent encounter for closed fracture with routine healing: Secondary | ICD-10-CM | POA: Diagnosis not present

## 2020-06-25 DIAGNOSIS — N183 Chronic kidney disease, stage 3 unspecified: Secondary | ICD-10-CM | POA: Diagnosis not present

## 2020-06-25 DIAGNOSIS — I2584 Coronary atherosclerosis due to calcified coronary lesion: Secondary | ICD-10-CM | POA: Diagnosis not present

## 2020-06-25 DIAGNOSIS — I129 Hypertensive chronic kidney disease with stage 1 through stage 4 chronic kidney disease, or unspecified chronic kidney disease: Secondary | ICD-10-CM | POA: Diagnosis not present

## 2020-06-25 DIAGNOSIS — K4091 Unilateral inguinal hernia, without obstruction or gangrene, recurrent: Secondary | ICD-10-CM | POA: Diagnosis not present

## 2020-06-25 DIAGNOSIS — C649 Malignant neoplasm of unspecified kidney, except renal pelvis: Secondary | ICD-10-CM | POA: Diagnosis not present

## 2020-06-25 DIAGNOSIS — E1122 Type 2 diabetes mellitus with diabetic chronic kidney disease: Secondary | ICD-10-CM | POA: Diagnosis not present

## 2020-06-26 ENCOUNTER — Other Ambulatory Visit: Payer: Medicare HMO

## 2020-06-26 ENCOUNTER — Other Ambulatory Visit: Payer: Self-pay

## 2020-06-26 ENCOUNTER — Ambulatory Visit: Payer: Medicare HMO

## 2020-06-26 ENCOUNTER — Inpatient Hospital Stay: Payer: Medicare HMO

## 2020-06-26 VITALS — BP 157/92 | HR 95 | Temp 98.1°F | Resp 16 | Wt 174.2 lb

## 2020-06-26 DIAGNOSIS — C9000 Multiple myeloma not having achieved remission: Secondary | ICD-10-CM

## 2020-06-26 DIAGNOSIS — Z5112 Encounter for antineoplastic immunotherapy: Secondary | ICD-10-CM | POA: Diagnosis not present

## 2020-06-26 LAB — CBC WITH DIFFERENTIAL (CANCER CENTER ONLY)
Abs Immature Granulocytes: 0.03 10*3/uL (ref 0.00–0.07)
Basophils Absolute: 0 10*3/uL (ref 0.0–0.1)
Basophils Relative: 0 %
Eosinophils Absolute: 0 10*3/uL (ref 0.0–0.5)
Eosinophils Relative: 0 %
HCT: 37.5 % — ABNORMAL LOW (ref 39.0–52.0)
Hemoglobin: 12.5 g/dL — ABNORMAL LOW (ref 13.0–17.0)
Immature Granulocytes: 0 %
Lymphocytes Relative: 6 %
Lymphs Abs: 0.6 10*3/uL — ABNORMAL LOW (ref 0.7–4.0)
MCH: 31.4 pg (ref 26.0–34.0)
MCHC: 33.3 g/dL (ref 30.0–36.0)
MCV: 94.2 fL (ref 80.0–100.0)
Monocytes Absolute: 0.1 10*3/uL (ref 0.1–1.0)
Monocytes Relative: 1 %
Neutro Abs: 8.6 10*3/uL — ABNORMAL HIGH (ref 1.7–7.7)
Neutrophils Relative %: 93 %
Platelet Count: 152 10*3/uL (ref 150–400)
RBC: 3.98 MIL/uL — ABNORMAL LOW (ref 4.22–5.81)
RDW: 13.7 % (ref 11.5–15.5)
WBC Count: 9.3 10*3/uL (ref 4.0–10.5)
nRBC: 0 % (ref 0.0–0.2)

## 2020-06-26 LAB — CMP (CANCER CENTER ONLY)
ALT: 14 U/L (ref 0–44)
AST: 12 U/L — ABNORMAL LOW (ref 15–41)
Albumin: 3.7 g/dL (ref 3.5–5.0)
Alkaline Phosphatase: 64 U/L (ref 38–126)
Anion gap: 14 (ref 5–15)
BUN: 12 mg/dL (ref 8–23)
CO2: 19 mmol/L — ABNORMAL LOW (ref 22–32)
Calcium: 8.5 mg/dL — ABNORMAL LOW (ref 8.9–10.3)
Chloride: 101 mmol/L (ref 98–111)
Creatinine: 1.11 mg/dL (ref 0.61–1.24)
GFR, Estimated: >60 mL/min (ref >60)
Glucose, Bld: 239 mg/dL — ABNORMAL HIGH (ref 70–99)
Potassium: 4.1 mmol/L (ref 3.5–5.1)
Sodium: 134 mmol/L — ABNORMAL LOW (ref 135–145)
Total Bilirubin: 0.4 mg/dL (ref 0.3–1.2)
Total Protein: 7.5 g/dL (ref 6.5–8.1)

## 2020-06-26 MED ORDER — PROCHLORPERAZINE MALEATE 10 MG PO TABS
ORAL_TABLET | ORAL | Status: AC
  Filled 2020-06-26: qty 1

## 2020-06-26 MED ORDER — BORTEZOMIB CHEMO IV INJECTION 3.5 MG (1MG/ML-GENERIC)
1.0000 mg/m2 | Freq: Once | INTRAVENOUS | Status: AC
Start: 2020-06-26 — End: 2020-06-26
  Administered 2020-06-26: 2 mg via INTRAVENOUS
  Filled 2020-06-26: qty 2

## 2020-06-26 MED ORDER — PROCHLORPERAZINE MALEATE 10 MG PO TABS
10.0000 mg | ORAL_TABLET | Freq: Once | ORAL | Status: AC
  Administered 2020-06-26: 10 mg via ORAL

## 2020-06-26 MED ORDER — SODIUM CHLORIDE 0.9 % IV SOLN
Freq: Once | INTRAVENOUS | Status: AC
  Filled 2020-06-26: qty 250

## 2020-06-26 NOTE — Patient Instructions (Signed)
[facility] Discharge Instructions for Patients Receiving Chemotherapy  Today you received the following chemotherapy agent: Bortezomib (velcade)  To help prevent nausea and vomiting after your treatment, we encourage you to take your nausea medication as directed by your MD.   If you develop nausea and vomiting that is not controlled by your nausea medication, call the clinic.   BELOW ARE SYMPTOMS THAT SHOULD BE REPORTED IMMEDIATELY:  *FEVER GREATER THAN 100.5 F  *CHILLS WITH OR WITHOUT FEVER  NAUSEA AND VOMITING THAT IS NOT CONTROLLED WITH YOUR NAUSEA MEDICATION  *UNUSUAL SHORTNESS OF BREATH  *UNUSUAL BRUISING OR BLEEDING  TENDERNESS IN MOUTH AND THROAT WITH OR WITHOUT PRESENCE OF ULCERS  *URINARY PROBLEMS  *BOWEL PROBLEMS  UNUSUAL RASH Items with * indicate a potential emergency and should be followed up as soon as possible.  Feel free to call the clinic should you have any questions or concerns. The clinic phone number is [phone].  Please show the [place] at check-in to the Emergency Department and triage nurse.

## 2020-06-26 NOTE — Progress Notes (Signed)
Blood return noted before, during, and after IV Bortezomib.

## 2020-06-29 LAB — MULTIPLE MYELOMA PANEL, SERUM
Albumin SerPl Elph-Mcnc: 3.7 g/dL (ref 2.9–4.4)
Albumin/Glob SerPl: 1.2 (ref 0.7–1.7)
Alpha 1: 0.2 g/dL (ref 0.0–0.4)
Alpha2 Glob SerPl Elph-Mcnc: 0.8 g/dL (ref 0.4–1.0)
B-Globulin SerPl Elph-Mcnc: 0.8 g/dL (ref 0.7–1.3)
Gamma Glob SerPl Elph-Mcnc: 1.4 g/dL (ref 0.4–1.8)
Globulin, Total: 3.2 g/dL (ref 2.2–3.9)
IgA: 1306 mg/dL — ABNORMAL HIGH (ref 61–437)
IgG (Immunoglobin G), Serum: 380 mg/dL — ABNORMAL LOW (ref 603–1613)
IgM (Immunoglobulin M), Srm: 9 mg/dL — ABNORMAL LOW (ref 15–143)
M Protein SerPl Elph-Mcnc: 0.9 g/dL — ABNORMAL HIGH
Total Protein ELP: 6.9 g/dL (ref 6.0–8.5)

## 2020-06-29 LAB — KAPPA/LAMBDA LIGHT CHAINS
Kappa free light chain: 9.2 mg/L (ref 3.3–19.4)
Kappa, lambda light chain ratio: 0.03 — ABNORMAL LOW (ref 0.26–1.65)
Lambda free light chains: 271.4 mg/L — ABNORMAL HIGH (ref 5.7–26.3)

## 2020-06-30 DIAGNOSIS — I2584 Coronary atherosclerosis due to calcified coronary lesion: Secondary | ICD-10-CM | POA: Diagnosis not present

## 2020-06-30 DIAGNOSIS — C649 Malignant neoplasm of unspecified kidney, except renal pelvis: Secondary | ICD-10-CM | POA: Diagnosis not present

## 2020-06-30 DIAGNOSIS — K4091 Unilateral inguinal hernia, without obstruction or gangrene, recurrent: Secondary | ICD-10-CM | POA: Diagnosis not present

## 2020-06-30 DIAGNOSIS — N183 Chronic kidney disease, stage 3 unspecified: Secondary | ICD-10-CM | POA: Diagnosis not present

## 2020-06-30 DIAGNOSIS — E1122 Type 2 diabetes mellitus with diabetic chronic kidney disease: Secondary | ICD-10-CM | POA: Diagnosis not present

## 2020-06-30 DIAGNOSIS — I129 Hypertensive chronic kidney disease with stage 1 through stage 4 chronic kidney disease, or unspecified chronic kidney disease: Secondary | ICD-10-CM | POA: Diagnosis not present

## 2020-06-30 DIAGNOSIS — S72142D Displaced intertrochanteric fracture of left femur, subsequent encounter for closed fracture with routine healing: Secondary | ICD-10-CM | POA: Diagnosis not present

## 2020-06-30 DIAGNOSIS — C9000 Multiple myeloma not having achieved remission: Secondary | ICD-10-CM | POA: Diagnosis not present

## 2020-06-30 DIAGNOSIS — I2510 Atherosclerotic heart disease of native coronary artery without angina pectoris: Secondary | ICD-10-CM | POA: Diagnosis not present

## 2020-07-01 ENCOUNTER — Other Ambulatory Visit: Payer: Self-pay | Admitting: Family Medicine

## 2020-07-01 DIAGNOSIS — I712 Thoracic aortic aneurysm, without rupture, unspecified: Secondary | ICD-10-CM

## 2020-07-02 ENCOUNTER — Ambulatory Visit: Payer: Medicare HMO

## 2020-07-02 ENCOUNTER — Other Ambulatory Visit: Payer: Medicare HMO

## 2020-07-03 ENCOUNTER — Inpatient Hospital Stay: Payer: Medicare HMO

## 2020-07-03 ENCOUNTER — Other Ambulatory Visit: Payer: Self-pay

## 2020-07-03 VITALS — BP 160/90 | HR 94 | Temp 98.0°F | Resp 18 | Wt 175.5 lb

## 2020-07-03 DIAGNOSIS — C9000 Multiple myeloma not having achieved remission: Secondary | ICD-10-CM

## 2020-07-03 DIAGNOSIS — Z5112 Encounter for antineoplastic immunotherapy: Secondary | ICD-10-CM | POA: Diagnosis not present

## 2020-07-03 LAB — CMP (CANCER CENTER ONLY)
ALT: 9 U/L (ref 0–44)
AST: 12 U/L — ABNORMAL LOW (ref 15–41)
Albumin: 3.7 g/dL (ref 3.5–5.0)
Alkaline Phosphatase: 67 U/L (ref 38–126)
Anion gap: 13 (ref 5–15)
BUN: 13 mg/dL (ref 8–23)
CO2: 24 mmol/L (ref 22–32)
Calcium: 8.9 mg/dL (ref 8.9–10.3)
Chloride: 99 mmol/L (ref 98–111)
Creatinine: 1.12 mg/dL (ref 0.61–1.24)
GFR, Estimated: >60 mL/min (ref >60)
Glucose, Bld: 168 mg/dL — ABNORMAL HIGH (ref 70–99)
Potassium: 4.1 mmol/L (ref 3.5–5.1)
Sodium: 136 mmol/L (ref 135–145)
Total Bilirubin: 0.4 mg/dL (ref 0.3–1.2)
Total Protein: 7.5 g/dL (ref 6.5–8.1)

## 2020-07-03 LAB — CBC WITH DIFFERENTIAL (CANCER CENTER ONLY)
Abs Immature Granulocytes: 0.03 10*3/uL (ref 0.00–0.07)
Basophils Absolute: 0 10*3/uL (ref 0.0–0.1)
Basophils Relative: 0 %
Eosinophils Absolute: 0 10*3/uL (ref 0.0–0.5)
Eosinophils Relative: 0 %
HCT: 38.5 % — ABNORMAL LOW (ref 39.0–52.0)
Hemoglobin: 12.9 g/dL — ABNORMAL LOW (ref 13.0–17.0)
Immature Granulocytes: 0 %
Lymphocytes Relative: 7 %
Lymphs Abs: 0.7 10*3/uL (ref 0.7–4.0)
MCH: 31.4 pg (ref 26.0–34.0)
MCHC: 33.5 g/dL (ref 30.0–36.0)
MCV: 93.7 fL (ref 80.0–100.0)
Monocytes Absolute: 0.1 10*3/uL (ref 0.1–1.0)
Monocytes Relative: 1 %
Neutro Abs: 8.2 10*3/uL — ABNORMAL HIGH (ref 1.7–7.7)
Neutrophils Relative %: 92 %
Platelet Count: 150 10*3/uL (ref 150–400)
RBC: 4.11 MIL/uL — ABNORMAL LOW (ref 4.22–5.81)
RDW: 13.6 % (ref 11.5–15.5)
WBC Count: 9 10*3/uL (ref 4.0–10.5)
nRBC: 0 % (ref 0.0–0.2)

## 2020-07-03 MED ORDER — BORTEZOMIB CHEMO IV INJECTION 3.5 MG (1MG/ML-GENERIC)
1.0000 mg/m2 | Freq: Once | INTRAVENOUS | Status: AC
  Administered 2020-07-03: 2 mg via INTRAVENOUS
  Filled 2020-07-03: qty 2

## 2020-07-03 MED ORDER — PROCHLORPERAZINE MALEATE 10 MG PO TABS
ORAL_TABLET | ORAL | Status: AC
  Filled 2020-07-03: qty 1

## 2020-07-03 MED ORDER — PROCHLORPERAZINE MALEATE 10 MG PO TABS
10.0000 mg | ORAL_TABLET | Freq: Once | ORAL | Status: AC
  Administered 2020-07-03: 10 mg via ORAL

## 2020-07-03 MED ORDER — SODIUM CHLORIDE 0.9 % IV SOLN
Freq: Once | INTRAVENOUS | Status: AC
  Filled 2020-07-03: qty 250

## 2020-07-03 NOTE — Patient Instructions (Signed)
[facility] Discharge Instructions for Patients Receiving Chemotherapy  Today you received the following chemotherapy agents: bortezomib.  To help prevent nausea and vomiting after your treatment, we encourage you to take your nausea medication as directed.   If you develop nausea and vomiting that is not controlled by your nausea medication, call the clinic.   BELOW ARE SYMPTOMS THAT SHOULD BE REPORTED IMMEDIATELY:  *FEVER GREATER THAN 100.5 F  *CHILLS WITH OR WITHOUT FEVER  NAUSEA AND VOMITING THAT IS NOT CONTROLLED WITH YOUR NAUSEA MEDICATION  *UNUSUAL SHORTNESS OF BREATH  *UNUSUAL BRUISING OR BLEEDING  TENDERNESS IN MOUTH AND THROAT WITH OR WITHOUT PRESENCE OF ULCERS  *URINARY PROBLEMS  *BOWEL PROBLEMS  UNUSUAL RASH Items with * indicate a potential emergency and should be followed up as soon as possible.  Feel free to call the clinic should you have any questions or concerns. The clinic phone number is [phone].  Please show the CHEMO ALERT CARD at check-in to the Emergency Department and triage nurse.   

## 2020-07-07 ENCOUNTER — Ambulatory Visit (INDEPENDENT_AMBULATORY_CARE_PROVIDER_SITE_OTHER): Payer: Medicare HMO

## 2020-07-07 ENCOUNTER — Encounter: Payer: Self-pay | Admitting: Orthopaedic Surgery

## 2020-07-07 ENCOUNTER — Ambulatory Visit: Payer: Medicare HMO | Admitting: Orthopaedic Surgery

## 2020-07-07 VITALS — BP 164/93 | HR 92 | Ht 70.0 in | Wt 148.0 lb

## 2020-07-07 DIAGNOSIS — S32059A Unspecified fracture of fifth lumbar vertebra, initial encounter for closed fracture: Secondary | ICD-10-CM | POA: Diagnosis not present

## 2020-07-07 NOTE — Progress Notes (Signed)
Office Visit Note   Patient: Devin [name]           Date of Birth: Jun 24, 1940           MRN: [id number] Visit Date: 07/07/2020              Requested by: [name], MD [place],  [place] [address] PCP: [name], MD   Assessment & Plan: Visit Diagnoses:  1. Closed fracture of fifth lumbar vertebra, unspecified fracture morphology, initial encounter [facility])     Plan: Reviewed x-rays with patient and his wife.  I can check him back on an as-needed basis continue to use his walker for fall prevention especially with his recent fall and hip fracture.  He can follow-up with me as needed.  Follow-Up Instructions: No follow-ups on file.   Orders:  Orders Placed This Encounter  Procedures  . XR Lumbar Spine 2-3 Views   No orders of the defined types were placed in this encounter.     Procedures: No procedures performed   Clinical Data: No additional findings.   Subjective: Chief Complaint  Patient presents with  . Lower Back - Pain    HPI 80 year old male returns for follow-up of L5 pathologic fracture with multiple myeloma.  In the interim when I last saw him he had a fall on 05/02/2020 and had left hip fracture fixed with a trochanteric nail at [facility] after he fell in the kitchen at his home in [place].  Hip is doing well he still using his walker.  He states his back is doing well not really giving him much problems.  New lumbar x-rays are obtained today.  Review of Systems unchanged other than new left hip fracture as above.   Objective: Vital Signs: BP (!) 164/93   Pulse 92   Ht $R'5\' 10"'mM$  (1.778 m)   Wt 148 lb (67.1 kg)   BMI 21.24 kg/m   Physical Exam Constitutional:      Appearance: He is well-developed.  HENT:     Head: Normocephalic and atraumatic.  Eyes:     Pupils: Pupils are equal, round, and reactive to light.  Neck:     Thyroid: No thyromegaly.     Trachea: No tracheal deviation.   Cardiovascular:     Rate and Rhythm: Normal rate.  Pulmonary:     Effort: Pulmonary effort is normal.     Breath sounds: No wheezing.  Abdominal:     General: Bowel sounds are normal.     Palpations: Abdomen is soft.  Skin:    General: Skin is warm and dry.     Capillary Refill: Capillary refill takes less than 2 seconds.  Neurological:     Mental Status: He is alert and oriented to person, place, and time.  Psychiatric:        Behavior: Behavior normal.        Thought Content: Thought content normal.        Judgment: Judgment normal.     Ortho Exam patient is amatory with a walker.  Gastrocsoleus anterior tib is intact.  He still has some weakness in the left hip and quad which she relates to postop from his hip fracture which is improving. Specialty Comments:  No specialty comments available.  Imaging: No results found.   PMFS History: Patient Active Problem List   Diagnosis Date Noted  . Fracture of L5 vertebra ([place]) 03/31/2020  . Intractable pain 03/31/2020  . Impaired  ambulation 03/31/2020  . Closed L5 vertebral fracture ([place]) 03/31/2020  . Chronic kidney disease, stage 3 unspecified ([place]) 03/03/2020  . 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/2020  . Essential hypertension 03/03/2020  . Hardening of the aorta (main artery of the heart) ([place]) 03/03/2020  . Iron deficiency anemia 03/03/2020  . Multiple myeloma ([place]) 03/03/2020  . Obesity 03/03/2020  . Personal history of colonic polyps 03/03/2020  . Personal history of pulmonary embolism 03/03/2020  . Prediabetes 03/03/2020  . Primary gout 03/03/2020  . Pure hypercholesterolemia 03/03/2020  . 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[place]) 03/03/2020  . Thoracic aortic aneurysm without rupture ([place]) 03/03/2020  . Type 2 diabetes mellitus ([place]) 03/03/2020  . Vitamin D deficiency 03/03/2020  . Malignant tumor of kidney ([place]) 03/03/2020  . 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 Renal lesion 05/06/2016  . Delirium  05/05/2016  . Pulmonary embolus ([place]) 05/05/2016  . 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 Osteoarthritis of right knee 04/20/2016  . Rheumatoid arthritis involving right knee ([place]) 04/20/2016  . Inguinal hernia without mention of obstruction or gangrene, unilateral or unspecified, (not specified as recurrent)-left 08/10/2011   Past Medical History:  Diagnosis Date  . Arthritis   . History of pulmonary embolism 04/2016  . Hypertension   . Renal cell carcinoma (HCC)     Family History  Problem Relation Age of Onset  . Cancer Mother        bone  . Breast cancer Neg Hx   . Colon cancer Neg Hx   . Pancreatic cancer Neg Hx   . Prostate cancer Neg Hx     Past Surgical History:  Procedure Laterality Date  . APPENDECTOMY    . DENTAL SURGERY  03/2016   all upper teeth pulled and dentures.   Marland Kitchen HERNIA REPAIR  1985&2001  . IR FLUORO GUIDED NEEDLE PLC ASPIRATION/INJECTION LOC  02/05/2020  . right knee replacement   04/2016  . right wrist plate due to fracture     . ROBOT ASSISTED LAPAROSCOPIC NEPHRECTOMY Right 10/03/2016   Procedure: XI ROBOTIC ASSISTED LAPAROSCOPIC  PARTIAL NEPHRECTOMY;  Surgeon: Raynelle Bring, MD;  Location: [facility];  Service: Urology;  Laterality: Right;   Social History   Occupational History    Comment: retired  Tobacco Use  . Smoking status: Former Research scientist (life sciences)  . Smokeless tobacco: Former Systems developer    Quit date: 08/09/1985  Vaping Use  . Vaping Use: Never used  Substance and Sexual Activity  . Alcohol use: Yes    Comment: occ beer   . Drug use: No  . Sexual activity: Not Currently

## 2020-07-08 DIAGNOSIS — E1122 Type 2 diabetes mellitus with diabetic chronic kidney disease: Secondary | ICD-10-CM | POA: Diagnosis not present

## 2020-07-08 DIAGNOSIS — I129 Hypertensive chronic kidney disease with stage 1 through stage 4 chronic kidney disease, or unspecified chronic kidney disease: Secondary | ICD-10-CM | POA: Diagnosis not present

## 2020-07-08 DIAGNOSIS — S72142D Displaced intertrochanteric fracture of left femur, subsequent encounter for closed fracture with routine healing: Secondary | ICD-10-CM | POA: Diagnosis not present

## 2020-07-08 DIAGNOSIS — N183 Chronic kidney disease, stage 3 unspecified: Secondary | ICD-10-CM | POA: Diagnosis not present

## 2020-07-08 DIAGNOSIS — C9000 Multiple myeloma not having achieved remission: Secondary | ICD-10-CM | POA: Diagnosis not present

## 2020-07-08 DIAGNOSIS — I2510 Atherosclerotic heart disease of native coronary artery without angina pectoris: Secondary | ICD-10-CM | POA: Diagnosis not present

## 2020-07-08 DIAGNOSIS — I2584 Coronary atherosclerosis due to calcified coronary lesion: Secondary | ICD-10-CM | POA: Diagnosis not present

## 2020-07-08 DIAGNOSIS — K4091 Unilateral inguinal hernia, without obstruction or gangrene, recurrent: Secondary | ICD-10-CM | POA: Diagnosis not present

## 2020-07-08 DIAGNOSIS — C649 Malignant neoplasm of unspecified kidney, except renal pelvis: Secondary | ICD-10-CM | POA: Diagnosis not present

## 2020-07-10 ENCOUNTER — Other Ambulatory Visit: Payer: Self-pay

## 2020-07-10 ENCOUNTER — Inpatient Hospital Stay: Payer: Medicare HMO

## 2020-07-10 ENCOUNTER — Inpatient Hospital Stay (HOSPITAL_BASED_OUTPATIENT_CLINIC_OR_DEPARTMENT_OTHER): Payer: Medicare HMO | Admitting: Oncology

## 2020-07-10 ENCOUNTER — Other Ambulatory Visit: Payer: Medicare HMO

## 2020-07-10 VITALS — BP 155/93 | HR 88 | Temp 96.5°F | Resp 20 | Wt 167.1 lb

## 2020-07-10 DIAGNOSIS — Z5112 Encounter for antineoplastic immunotherapy: Secondary | ICD-10-CM | POA: Diagnosis not present

## 2020-07-10 DIAGNOSIS — C9000 Multiple myeloma not having achieved remission: Secondary | ICD-10-CM

## 2020-07-10 LAB — CBC WITH DIFFERENTIAL (CANCER CENTER ONLY)
Abs Immature Granulocytes: 0.05 10*3/uL (ref 0.00–0.07)
Basophils Absolute: 0 10*3/uL (ref 0.0–0.1)
Basophils Relative: 0 %
Eosinophils Absolute: 0 10*3/uL (ref 0.0–0.5)
Eosinophils Relative: 0 %
HCT: 36 % — ABNORMAL LOW (ref 39.0–52.0)
Hemoglobin: 12.5 g/dL — ABNORMAL LOW (ref 13.0–17.0)
Immature Granulocytes: 1 %
Lymphocytes Relative: 6 %
Lymphs Abs: 0.6 10*3/uL — ABNORMAL LOW (ref 0.7–4.0)
MCH: 32.1 pg (ref 26.0–34.0)
MCHC: 34.7 g/dL (ref 30.0–36.0)
MCV: 92.3 fL (ref 80.0–100.0)
Monocytes Absolute: 0.2 10*3/uL (ref 0.1–1.0)
Monocytes Relative: 2 %
Neutro Abs: 8.6 10*3/uL — ABNORMAL HIGH (ref 1.7–7.7)
Neutrophils Relative %: 91 %
Platelet Count: 150 10*3/uL (ref 150–400)
RBC: 3.9 MIL/uL — ABNORMAL LOW (ref 4.22–5.81)
RDW: 13.7 % (ref 11.5–15.5)
WBC Count: 9.5 10*3/uL (ref 4.0–10.5)
nRBC: 0 % (ref 0.0–0.2)

## 2020-07-10 LAB — CMP (CANCER CENTER ONLY)
ALT: 9 U/L (ref 0–44)
AST: 12 U/L — ABNORMAL LOW (ref 15–41)
Albumin: 3.5 g/dL (ref 3.5–5.0)
Alkaline Phosphatase: 66 U/L (ref 38–126)
Anion gap: 11 (ref 5–15)
BUN: 12 mg/dL (ref 8–23)
CO2: 23 mmol/L (ref 22–32)
Calcium: 8.8 mg/dL — ABNORMAL LOW (ref 8.9–10.3)
Chloride: 100 mmol/L (ref 98–111)
Creatinine: 1.04 mg/dL (ref 0.61–1.24)
GFR, Estimated: >60 mL/min (ref >60)
Glucose, Bld: 217 mg/dL — ABNORMAL HIGH (ref 70–99)
Potassium: 4.1 mmol/L (ref 3.5–5.1)
Sodium: 134 mmol/L — ABNORMAL LOW (ref 135–145)
Total Bilirubin: 0.4 mg/dL (ref 0.3–1.2)
Total Protein: 7.1 g/dL (ref 6.5–8.1)

## 2020-07-10 MED ORDER — SODIUM CHLORIDE 0.9 % IV SOLN
Freq: Once | INTRAVENOUS | Status: AC
Start: 2020-07-10 — End: 2020-07-10
  Filled 2020-07-10: qty 250

## 2020-07-10 MED ORDER — DEXAMETHASONE 4 MG PO TABS: ORAL_TABLET | ORAL | 3 refills | Status: DC

## 2020-07-10 MED ORDER — MORPHINE SULFATE 15 MG PO TABS: 15.0000 mg | ORAL_TABLET | ORAL | 0 refills | Status: DC | PRN

## 2020-07-10 MED ORDER — PROCHLORPERAZINE MALEATE 10 MG PO TABS
10.0000 mg | ORAL_TABLET | Freq: Once | ORAL | Status: AC
  Administered 2020-07-10: 10 mg via ORAL

## 2020-07-10 MED ORDER — BORTEZOMIB CHEMO IV INJECTION 3.5 MG (1MG/ML-GENERIC)
1.0000 mg/m2 | Freq: Once | INTRAVENOUS | Status: AC
Start: 2020-07-10 — End: 2020-07-10
  Administered 2020-07-10: 2 mg via INTRAVENOUS
  Filled 2020-07-10: qty 2

## 2020-07-10 MED ORDER — ACYCLOVIR 400 MG PO TABS: 400.0000 mg | ORAL_TABLET | Freq: Every day | ORAL | 3 refills | Status: DC

## 2020-07-10 MED ORDER — MORPHINE SULFATE ER 30 MG PO TBCR: 30.0000 mg | EXTENDED_RELEASE_TABLET | Freq: Two times a day (BID) | ORAL | 0 refills | Status: DC

## 2020-07-10 NOTE — Patient Instructions (Signed)
[facility] MEDICAL ONCOLOGY  Discharge Instructions: Thank you for choosing [facility] to provide your oncology and hematology care.   If you have a lab appointment with the Cancer Center, please go directly to the Cancer Center and check in at the registration area.   Wear comfortable clothing and clothing appropriate for easy access to any Portacath or PICC line.   We strive to give you quality time with your provider. You may need to reschedule your appointment if you arrive late (15 or more minutes).  Arriving late affects you and other patients whose appointments are after yours.  Also, if you miss three or more appointments without notifying the office, you may be dismissed from the clinic at the provider's discretion.      For prescription refill requests, have your pharmacy contact our office and allow 72 hours for refills to be completed.    Today you received the following chemotherapy and/or immunotherapy agent: Bortezomib (Velcade).   To help prevent nausea and vomiting after your treatment, we encourage you to take your nausea medication as directed.  BELOW ARE SYMPTOMS THAT SHOULD BE REPORTED IMMEDIATELY: *FEVER GREATER THAN 100.4 F (38 C) OR HIGHER *CHILLS OR SWEATING *NAUSEA AND VOMITING THAT IS NOT CONTROLLED WITH YOUR NAUSEA MEDICATION *UNUSUAL SHORTNESS OF BREATH *UNUSUAL BRUISING OR BLEEDING *URINARY PROBLEMS (pain or burning when urinating, or frequent urination) *BOWEL PROBLEMS (unusual diarrhea, constipation, pain near the anus) TENDERNESS IN MOUTH AND THROAT WITH OR WITHOUT PRESENCE OF ULCERS (sore throat, sores in mouth, or a toothache) UNUSUAL RASH, SWELLING OR PAIN  UNUSUAL VAGINAL DISCHARGE OR ITCHING   Items with * indicate a potential emergency and should be followed up as soon as possible or go to the Emergency Department if any problems should occur.  Please show the CHEMOTHERAPY ALERT CARD or IMMUNOTHERAPY ALERT CARD at  check-in to the Emergency Department and triage nurse.  Should you have questions after your visit or need to cancel or reschedule your appointment, please contact [facility]COL[facility]  Dept: [phone]  and follow the prompts.  Office hours are 8:00 a.m. to 4:30 p.m. Monday - Friday. Please note that voicemails left after 4:00 p.m. may not be returned until the following business day.  We are closed weekends and major holidays. You have access to a nurse at all times for urgent questions. Please call the main number to the clinic Dept: [phone] and follow the prompts.   For any non-urgent questions, you may also contact your provider using MyChart. We now offer e-Visits for anyone 18 and older to request care online for non-urgent symptoms. For details visit [email or web address].   Also download the MyChart app! Go to the app store, search "MyChart", open the app, select [facility], and log in with your MyChart username and password.  Due to Covid, a mask is required upon entering the hospital/clinic. If you do not have a mask, one will be given to you upon arrival. For doctor visits, patients may have 1 support person aged 18 or older with them. For treatment visits, patients cannot have anyone with them due to current Covid guidelines and our immunocompromised population.   

## 2020-07-10 NOTE — Progress Notes (Signed)
Hematology and Oncology Follow Up Visit  Devin Devin [name] [id number] 10-17-1940 80 y.o. 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   Prior Therapy:   He status post right robotic laparoscopic partial nephrectomy on October 03, 2016.  Palliative radiation therapy to the spine.  He completed 20 Gy in 10 fractions on March 24, 2020.  Current therapy:   Velcade and dexamethasone started on January 7 to be given weekly.  He is here for the next cycle of treatment.   Interim History: Devin Devin [name] is here for return evaluation.  Since the last visit, he reports no major changes in his health.  He continues to tolerate Velcade and dexamethasone without any major complications.  He denies any nausea, fatigue or worsening neuropathy.  He denies any recent hospitalizations or illnesses.  He is recovering from his hip surgery and continues to participate in physical therapy.  He is ambulating with the help of Devin [name] walker but he feels he is closer to using Devin [name] cane.    Medications: Reviewed without changes. Current Outpatient Medications  Medication Sig Dispense Refill  . acetaminophen (TYLENOL) 500 MG tablet Take 1,000 mg by mouth daily as needed for mild pain or moderate pain.    Marland Kitchen acyclovir (ZOVIRAX) 400 MG tablet Take 1 tablet (400 mg total) by mouth daily. 90 tablet 3  . allopurinol (ZYLOPRIM) 300 MG tablet Take 300 mg by mouth daily.     Marland Kitchen amLODipine (NORVASC) 10 MG tablet Take 10 mg by mouth daily.    . ASPIRIN LOW DOSE 81 MG EC tablet Take 81 mg by mouth daily.    . calcium-vitamin D (OSCAL WITH D) 500-200 MG-UNIT tablet Take 1 tablet  by mouth 2 (two) times daily. 60 tablet 3  . dexamethasone (DECADRON) 4 MG tablet Take 5 tablets weekly on the day of cancer treatment. 60 tablet 3  . folic acid (FOLVITE) 1 MG tablet Take 1 mg by mouth daily.    . furosemide (LASIX) 20 MG tablet Take 20 mg by mouth 2 (two) times daily.     Marland Kitchen labetalol (NORMODYNE) 300 MG tablet Take 300 mg by mouth 2 (two) times daily.     Marland Kitchen lidocaine (LIDODERM) 5 % Place 1 patch onto the skin every 12 (twelve) hours. Remove & Discard patch within 12 hours or as directed by MD 20 patch 0  . losartan (COZAAR) 100 MG tablet Take 100 mg by mouth daily.    Marland Kitchen morphine (MS CONTIN) 30 MG 12 hr tablet Take 1 tablet (30 mg total) by mouth every 12 (twelve) hours. 60 tablet 0  . morphine (MSIR) 15 MG tablet Take 1 tablet (15 mg total) by mouth every 4 (four) hours as needed for severe pain. 60 tablet 0  . NARCAN 4 MG/0.1ML LIQD nasal spray kit Place 1 spray into the nose once.    . polyethylene glycol (MIRALAX / GLYCOLAX) 17 g packet Take 17 g by mouth daily as needed for moderate constipation. 14 each 0  . rosuvastatin (CRESTOR) 5 MG tablet Take 5 mg by mouth daily.    Marland Kitchen senna-docusate (SENOKOT-S) 8.6-50 MG tablet Take 1 tablet by mouth at bedtime. 100 tablet 0  .  Vitamin D, Ergocalciferol, (DRISDOL) 1.25 MG (50000 UNIT) CAPS capsule Take 50,000 Units by mouth every 14 (fourteen) days.     No current facility-administered medications for this visit.     Allergies:  Allergies  Allergen Reactions  . Amlodipine Swelling  . Pravastatin Other (See Comments)    Joint pain  . Zestril [Lisinopril] Other (See Comments)    Gout   ( Zestril) gout  . Other Other (See Comments)  . Oxycodone Other (See Comments)    Causes agitation       Physical Exam:  Blood pressure (!) 155/93, pulse 88, temperature (!) 96.5 F (35.8 C), temperature source Tympanic, resp. rate 20, weight 167 lb 2 oz (75.8 kg), SpO2 97 %.     ECOG:  1     General appearance: Alert, awake  without any distress. Head: Atraumatic without abnormalities Oropharynx: Without any thrush or ulcers. Eyes: No scleral icterus. Lymph nodes: No lymphadenopathy noted in the cervical, supraclavicular, or axillary nodes Heart:regular rate and rhythm, without any murmurs or gallops.   Lung: Clear to auscultation without any rhonchi, wheezes or dullness to percussion. Abdomin: Soft, nontender without any shifting dullness or ascites. Musculoskeletal: No clubbing or cyanosis. Neurological: No motor or sensory deficits. Skin: No rashes or lesions.        Lab Results: Lab Results  Component Value Date   WBC 9.0 07/03/2020   HGB 12.9 (L) 07/03/2020   HCT 38.5 (L) 07/03/2020   MCV 93.7 07/03/2020   PLT 150 07/03/2020     Chemistry      Component Value Date/Time   NA 136 07/03/2020 1237   K 4.1 07/03/2020 1237   CL 99 07/03/2020 1237   CO2 24 07/03/2020 1237   BUN 13 07/03/2020 1237   CREATININE 1.12 07/03/2020 1237      Component Value Date/Time   CALCIUM 8.9 07/03/2020 1237   ALKPHOS 67 07/03/2020 1237   AST 12 (L) 07/03/2020 1237   ALT 9 07/03/2020 1237   BILITOT 0.4 07/03/2020 1237        Results for Devin Devin [name], Devin Devin [name] (MRN [id number]) as of 07/10/2020 11:13  Ref. Range 02/11/2020 11:49 03/27/2020 13:58 05/22/2020 12:18 06/26/2020 13:22  M Protein SerPl Elph-Mcnc Latest Ref Range: Not Observed g/dL 2.5 (H) 0.7 (H) 0.7 (H) 0.9 (H)    Impression and Plan:  80 year old with:  1.  IgA lambda multiple myeloma presented with  poor prognostic features including (14,16)  Translocation and bone disease in November 2021.   His disease status was updated at this time and treatment options were reviewed.  He is currently on Velcade and dexamethasone with reasonable response to therapy and M spike currently has declined from 2.5 to 0.7 in March 2022. On April 8 his M spike is 0.9 with IgA level continues to decline.  Serum light chains also showing reasonable decline with his lambda free  light chain.  Risks and benefits of continuing Velcade alone versus adding Revlimid or daratumumab.  After discussion today, we opted to continue and monitor his status for the next 4 weeks and make Devin [name] determination accordingly.   2.  Compression fracture of the spine: Appears to be recovering well at this time without any pain exacerbation.  3.  Antiemetics: No nausea or vomiting reported at this time.  Compazine is available to him.  5.  VZV prophylaxis: He is currently on acyclovir without any reactivation.  This will be refilled for him.  6.  Bone directed therapy: He is  currently on Zometa every 3 months with next injection will be given in May 2022.  7.  Chronic pain: He is currently on morphine 30 mg twice Devin [name] day with very infrequent use for breakthrough pain medication.  I will refill this medication for him.  8. Follow-up: He will continue to follow with weekly Velcade and MD follow-up in 4 weeks.   30  minutes were spent on this visit.  The time was dedicated to reviewing laboratory data, disease status update and outlining future plan of care.     Zola Button, MD 4/22/202211:12 AM

## 2020-07-10 NOTE — Progress Notes (Signed)
Blood return noted before, during, and after Bortezomib IV injection.

## 2020-07-13 ENCOUNTER — Other Ambulatory Visit: Payer: Self-pay | Admitting: Oncology

## 2020-07-13 DIAGNOSIS — C44329 Squamous cell carcinoma of skin of other parts of face: Secondary | ICD-10-CM | POA: Diagnosis not present

## 2020-07-13 DIAGNOSIS — L578 Other skin changes due to chronic exposure to nonionizing radiation: Secondary | ICD-10-CM | POA: Diagnosis not present

## 2020-07-15 DIAGNOSIS — I129 Hypertensive chronic kidney disease with stage 1 through stage 4 chronic kidney disease, or unspecified chronic kidney disease: Secondary | ICD-10-CM | POA: Diagnosis not present

## 2020-07-15 DIAGNOSIS — K4091 Unilateral inguinal hernia, without obstruction or gangrene, recurrent: Secondary | ICD-10-CM | POA: Diagnosis not present

## 2020-07-15 DIAGNOSIS — C9000 Multiple myeloma not having achieved remission: Secondary | ICD-10-CM | POA: Diagnosis not present

## 2020-07-15 DIAGNOSIS — I2510 Atherosclerotic heart disease of native coronary artery without angina pectoris: Secondary | ICD-10-CM | POA: Diagnosis not present

## 2020-07-15 DIAGNOSIS — I2584 Coronary atherosclerosis due to calcified coronary lesion: Secondary | ICD-10-CM | POA: Diagnosis not present

## 2020-07-15 DIAGNOSIS — N183 Chronic kidney disease, stage 3 unspecified: Secondary | ICD-10-CM | POA: Diagnosis not present

## 2020-07-15 DIAGNOSIS — E1122 Type 2 diabetes mellitus with diabetic chronic kidney disease: Secondary | ICD-10-CM | POA: Diagnosis not present

## 2020-07-15 DIAGNOSIS — C649 Malignant neoplasm of unspecified kidney, except renal pelvis: Secondary | ICD-10-CM | POA: Diagnosis not present

## 2020-07-15 DIAGNOSIS — S72142D Displaced intertrochanteric fracture of left femur, subsequent encounter for closed fracture with routine healing: Secondary | ICD-10-CM | POA: Diagnosis not present

## 2020-07-16 ENCOUNTER — Other Ambulatory Visit: Payer: Self-pay

## 2020-07-16 ENCOUNTER — Ambulatory Visit
Admission: RE | Admit: 2020-07-16 | Discharge: 2020-07-16 | Disposition: A | Discharge status: Home / Self Care | Payer: Medicare HMO | Source: Ambulatory Visit | Attending: Family Medicine | Admitting: Family Medicine

## 2020-07-16 DIAGNOSIS — I712 Thoracic aortic aneurysm, without rupture, unspecified: Secondary | ICD-10-CM

## 2020-07-16 DIAGNOSIS — S22080A Wedge compression fracture of T11-T12 vertebra, initial encounter for closed fracture: Secondary | ICD-10-CM | POA: Diagnosis not present

## 2020-07-16 DIAGNOSIS — Z85528 Personal history of other malignant neoplasm of kidney: Secondary | ICD-10-CM | POA: Diagnosis not present

## 2020-07-16 DIAGNOSIS — J439 Emphysema, unspecified: Secondary | ICD-10-CM | POA: Diagnosis not present

## 2020-07-16 MED ORDER — IOPAMIDOL (ISOVUE-370) INJECTION 76%
75.0000 mL | Freq: Once | INTRAVENOUS | Status: AC | PRN
  Administered 2020-07-16: 75 mL via INTRAVENOUS

## 2020-07-17 ENCOUNTER — Inpatient Hospital Stay: Payer: Medicare HMO

## 2020-07-17 ENCOUNTER — Ambulatory Visit: Payer: Medicare HMO

## 2020-07-17 VITALS — BP 152/98 | HR 82 | Temp 98.4°F | Resp 18 | Ht 70.0 in | Wt 178.8 lb

## 2020-07-17 DIAGNOSIS — C9000 Multiple myeloma not having achieved remission: Secondary | ICD-10-CM

## 2020-07-17 DIAGNOSIS — Z5112 Encounter for antineoplastic immunotherapy: Secondary | ICD-10-CM | POA: Diagnosis not present

## 2020-07-17 LAB — CMP (CANCER CENTER ONLY)
ALT: 7 U/L (ref 0–44)
AST: 11 U/L — ABNORMAL LOW (ref 15–41)
Albumin: 3.6 g/dL (ref 3.5–5.0)
Alkaline Phosphatase: 68 U/L (ref 38–126)
Anion gap: 10 (ref 5–15)
BUN: 12 mg/dL (ref 8–23)
CO2: 23 mmol/L (ref 22–32)
Calcium: 8.7 mg/dL — ABNORMAL LOW (ref 8.9–10.3)
Chloride: 101 mmol/L (ref 98–111)
Creatinine: 1.07 mg/dL (ref 0.61–1.24)
GFR, Estimated: >60 mL/min (ref >60)
Glucose, Bld: 171 mg/dL — ABNORMAL HIGH (ref 70–99)
Potassium: 4.1 mmol/L (ref 3.5–5.1)
Sodium: 134 mmol/L — ABNORMAL LOW (ref 135–145)
Total Bilirubin: 0.3 mg/dL (ref 0.3–1.2)
Total Protein: 7.1 g/dL (ref 6.5–8.1)

## 2020-07-17 LAB — CBC WITH DIFFERENTIAL (CANCER CENTER ONLY)
Abs Immature Granulocytes: 0.03 10*3/uL (ref 0.00–0.07)
Basophils Absolute: 0 10*3/uL (ref 0.0–0.1)
Basophils Relative: 0 %
Eosinophils Absolute: 0.1 10*3/uL (ref 0.0–0.5)
Eosinophils Relative: 1 %
HCT: 36.8 % — ABNORMAL LOW (ref 39.0–52.0)
Hemoglobin: 12.4 g/dL — ABNORMAL LOW (ref 13.0–17.0)
Immature Granulocytes: 0 %
Lymphocytes Relative: 11 %
Lymphs Abs: 1.1 10*3/uL (ref 0.7–4.0)
MCH: 31 pg (ref 26.0–34.0)
MCHC: 33.7 g/dL (ref 30.0–36.0)
MCV: 92 fL (ref 80.0–100.0)
Monocytes Absolute: 0.6 10*3/uL (ref 0.1–1.0)
Monocytes Relative: 6 %
Neutro Abs: 8.3 10*3/uL — ABNORMAL HIGH (ref 1.7–7.7)
Neutrophils Relative %: 82 %
Platelet Count: 160 10*3/uL (ref 150–400)
RBC: 4 MIL/uL — ABNORMAL LOW (ref 4.22–5.81)
RDW: 13.5 % (ref 11.5–15.5)
WBC Count: 10.2 10*3/uL (ref 4.0–10.5)
nRBC: 0 % (ref 0.0–0.2)

## 2020-07-17 MED ORDER — SODIUM CHLORIDE 0.9% FLUSH
10.0000 mL | INTRAVENOUS | Status: DC | PRN
Start: 2020-07-17 — End: 2020-07-17
  Filled 2020-07-17: qty 10

## 2020-07-17 MED ORDER — HEPARIN SOD (PORK) LOCK FLUSH 100 UNIT/ML IV SOLN
500.0000 [IU] | Freq: Once | INTRAVENOUS | Status: DC | PRN
  Filled 2020-07-17: qty 5

## 2020-07-17 MED ORDER — PROCHLORPERAZINE MALEATE 10 MG PO TABS
10.0000 mg | ORAL_TABLET | Freq: Once | ORAL | Status: AC
  Administered 2020-07-17: 10 mg via ORAL

## 2020-07-17 MED ORDER — SODIUM CHLORIDE 0.9 % IV SOLN
Freq: Once | INTRAVENOUS | Status: AC
  Filled 2020-07-17: qty 250

## 2020-07-17 MED ORDER — PROCHLORPERAZINE MALEATE 10 MG PO TABS
ORAL_TABLET | ORAL | Status: AC
  Filled 2020-07-17: qty 1

## 2020-07-17 MED ORDER — BORTEZOMIB CHEMO IV INJECTION 3.5 MG (1MG/ML-GENERIC)
1.0000 mg/m2 | Freq: Once | INTRAVENOUS | Status: AC
  Administered 2020-07-17: 2 mg via INTRAVENOUS
  Filled 2020-07-17: qty 2

## 2020-07-17 NOTE — Patient Instructions (Signed)
[facility]  Discharge Instructions: Thank you for choosing [facility] to provide your oncology and hematology care.   If you have a lab appointment with the Cancer Center, please go directly to the Cancer Center and check in at the registration area.   Wear comfortable clothing and clothing appropriate for easy access to any Portacath or PICC line.   We strive to give you quality time with your provider. You may need to reschedule your appointment if you arrive late (15 or more minutes).  Arriving late affects you and other patients whose appointments are after yours.  Also, if you miss three or more appointments without notifying the office, you may be dismissed from the clinic at the provider's discretion.      For prescription refill requests, have your pharmacy contact our office and allow 72 hours for refills to be completed.    Today you received the following chemotherapy and/or immunotherapy agents Velcade       To help prevent nausea and vomiting after your treatment, we encourage you to take your nausea medication as directed.  BELOW ARE SYMPTOMS THAT SHOULD BE REPORTED IMMEDIATELY: *FEVER GREATER THAN 100.4 F (38 C) OR HIGHER *CHILLS OR SWEATING *NAUSEA AND VOMITING THAT IS NOT CONTROLLED WITH YOUR NAUSEA MEDICATION *UNUSUAL SHORTNESS OF BREATH *UNUSUAL BRUISING OR BLEEDING *URINARY PROBLEMS (pain or burning when urinating, or frequent urination) *BOWEL PROBLEMS (unusual diarrhea, constipation, pain near the anus) TENDERNESS IN MOUTH AND THROAT WITH OR WITHOUT PRESENCE OF ULCERS (sore throat, sores in mouth, or a toothache) UNUSUAL RASH, SWELLING OR PAIN  UNUSUAL VAGINAL DISCHARGE OR ITCHING   Items with * indicate a potential emergency and should be followed up as soon as possible or go to the Emergency Department if any problems should occur.  Please show the CHEMOTHERAPY ALERT CARD or IMMUNOTHERAPY ALERT CARD at check-in to  the Emergency Department and triage nurse.  Should you have questions after your visit or need to cancel or reschedule your appointment, please contact [facility]  Dept: [phone]  and follow the prompts.  Office hours are 8:00 a.m. to 4:30 p.m. Monday - Friday. Please note that voicemails left after 4:00 p.m. may not be returned until the following business day.  We are closed weekends and major holidays. You have access to a nurse at all times for urgent questions. Please call the main number to the clinic Dept: [phone] and follow the prompts.   For any non-urgent questions, you may also contact your provider using MyChart. We now offer e-Visits for anyone 18 and older to request care online for non-urgent symptoms. For details visit [email or web address].   Also download the MyChart app! Go to the app store, search "MyChart", open the app, select [facility], and log in with your MyChart username and password.  Due to Covid, a mask is required upon entering the hospital/clinic. If you do not have a mask, one will be given to you upon arrival. For doctor visits, patients may have 1 support person aged 18 or older with them. For treatment visits, patients cannot have anyone with them due to current Covid guidelines and our immunocompromised population.   

## 2020-07-22 DIAGNOSIS — C649 Malignant neoplasm of unspecified kidney, except renal pelvis: Secondary | ICD-10-CM | POA: Diagnosis not present

## 2020-07-22 DIAGNOSIS — E1122 Type 2 diabetes mellitus with diabetic chronic kidney disease: Secondary | ICD-10-CM | POA: Diagnosis not present

## 2020-07-22 DIAGNOSIS — N183 Chronic kidney disease, stage 3 unspecified: Secondary | ICD-10-CM | POA: Diagnosis not present

## 2020-07-22 DIAGNOSIS — K4091 Unilateral inguinal hernia, without obstruction or gangrene, recurrent: Secondary | ICD-10-CM | POA: Diagnosis not present

## 2020-07-22 DIAGNOSIS — S72142D Displaced intertrochanteric fracture of left femur, subsequent encounter for closed fracture with routine healing: Secondary | ICD-10-CM | POA: Diagnosis not present

## 2020-07-22 DIAGNOSIS — I129 Hypertensive chronic kidney disease with stage 1 through stage 4 chronic kidney disease, or unspecified chronic kidney disease: Secondary | ICD-10-CM | POA: Diagnosis not present

## 2020-07-22 DIAGNOSIS — I2584 Coronary atherosclerosis due to calcified coronary lesion: Secondary | ICD-10-CM | POA: Diagnosis not present

## 2020-07-22 DIAGNOSIS — C9000 Multiple myeloma not having achieved remission: Secondary | ICD-10-CM | POA: Diagnosis not present

## 2020-07-22 DIAGNOSIS — I2510 Atherosclerotic heart disease of native coronary artery without angina pectoris: Secondary | ICD-10-CM | POA: Diagnosis not present

## 2020-07-23 ENCOUNTER — Telehealth: Payer: Self-pay | Admitting: *Deleted

## 2020-07-23 NOTE — Telephone Encounter (Signed)
-----   Message from Wyatt Portela, MD sent at 07/23/2020 10:20 AM EDT ----- Regarding: RE: CT results There is nothing new on the scan. I will discuss next visit.  ----- Message ----- From: Rolene Course, RN Sent: 07/23/2020  10:01 AM EDT To: Wyatt Portela, MD Subject: CT results                                     This patient's wife called about his CT from 4/30, his PCP shared the results with them yesterday & they are concerned about a new compression fx & possible new mets.  She is requesting you call them to discuss results, or he has an appointment for labs & infusion tomorrow morning & they are asking if they could get an appointment with you then.  Please advise what works best, thanks, Set designer.

## 2020-07-23 NOTE — Telephone Encounter (Signed)
PC to patient's wife, informed her Dr. Alen Blew says there are no new findings on the CT scan & he will discuss this with them on the 5/20 MD visit.  She verbalizes understanding.

## 2020-07-24 ENCOUNTER — Inpatient Hospital Stay: Payer: Medicare HMO | Attending: Oncology

## 2020-07-24 ENCOUNTER — Inpatient Hospital Stay: Payer: Medicare HMO

## 2020-07-24 ENCOUNTER — Other Ambulatory Visit: Payer: Self-pay

## 2020-07-24 VITALS — BP 167/93 | HR 84 | Temp 98.1°F | Resp 20

## 2020-07-24 DIAGNOSIS — Z5112 Encounter for antineoplastic immunotherapy: Secondary | ICD-10-CM | POA: Diagnosis not present

## 2020-07-24 DIAGNOSIS — C9000 Multiple myeloma not having achieved remission: Secondary | ICD-10-CM

## 2020-07-24 LAB — CMP (CANCER CENTER ONLY)
ALT: 8 U/L (ref 0–44)
AST: 10 U/L — ABNORMAL LOW (ref 15–41)
Albumin: 3.6 g/dL (ref 3.5–5.0)
Alkaline Phosphatase: 58 U/L (ref 38–126)
Anion gap: 13 (ref 5–15)
BUN: 11 mg/dL (ref 8–23)
CO2: 23 mmol/L (ref 22–32)
Calcium: 8.7 mg/dL — ABNORMAL LOW (ref 8.9–10.3)
Chloride: 102 mmol/L (ref 98–111)
Creatinine: 1.05 mg/dL (ref 0.61–1.24)
GFR, Estimated: >60 mL/min (ref >60)
Glucose, Bld: 169 mg/dL — ABNORMAL HIGH (ref 70–99)
Potassium: 3.7 mmol/L (ref 3.5–5.1)
Sodium: 138 mmol/L (ref 135–145)
Total Bilirubin: 0.3 mg/dL (ref 0.3–1.2)
Total Protein: 7.1 g/dL (ref 6.5–8.1)

## 2020-07-24 LAB — CBC WITH DIFFERENTIAL (CANCER CENTER ONLY)
Abs Immature Granulocytes: 0.03 10*3/uL (ref 0.00–0.07)
Basophils Absolute: 0 10*3/uL (ref 0.0–0.1)
Basophils Relative: 0 %
Eosinophils Absolute: 0.1 10*3/uL (ref 0.0–0.5)
Eosinophils Relative: 1 %
HCT: 38.4 % — ABNORMAL LOW (ref 39.0–52.0)
Hemoglobin: 13.1 g/dL (ref 13.0–17.0)
Immature Granulocytes: 0 %
Lymphocytes Relative: 7 %
Lymphs Abs: 0.6 10*3/uL — ABNORMAL LOW (ref 0.7–4.0)
MCH: 31.3 pg (ref 26.0–34.0)
MCHC: 34.1 g/dL (ref 30.0–36.0)
MCV: 91.6 fL (ref 80.0–100.0)
Monocytes Absolute: 0.4 10*3/uL (ref 0.1–1.0)
Monocytes Relative: 4 %
Neutro Abs: 7.8 10*3/uL — ABNORMAL HIGH (ref 1.7–7.7)
Neutrophils Relative %: 88 %
Platelet Count: 163 10*3/uL (ref 150–400)
RBC: 4.19 MIL/uL — ABNORMAL LOW (ref 4.22–5.81)
RDW: 13.6 % (ref 11.5–15.5)
WBC Count: 9 10*3/uL (ref 4.0–10.5)
nRBC: 0 % (ref 0.0–0.2)

## 2020-07-24 MED ORDER — ZOLEDRONIC ACID 4 MG/100ML IV SOLN
INTRAVENOUS | Status: AC
  Filled 2020-07-24: qty 100

## 2020-07-24 MED ORDER — ZOLEDRONIC ACID 4 MG/100ML IV SOLN
4.0000 mg | Freq: Once | INTRAVENOUS | Status: AC
  Administered 2020-07-24: 4 mg via INTRAVENOUS

## 2020-07-24 MED ORDER — PROCHLORPERAZINE MALEATE 10 MG PO TABS
ORAL_TABLET | ORAL | Status: AC
  Filled 2020-07-24: qty 1

## 2020-07-24 MED ORDER — SODIUM CHLORIDE 0.9 % IV SOLN
Freq: Once | INTRAVENOUS | Status: AC
Start: 2020-07-24 — End: 2020-07-24
  Filled 2020-07-24: qty 250

## 2020-07-24 MED ORDER — BORTEZOMIB CHEMO IV INJECTION 3.5 MG (1MG/ML-GENERIC)
1.0000 mg/m2 | Freq: Once | INTRAVENOUS | Status: AC
  Administered 2020-07-24: 2 mg via INTRAVENOUS
  Filled 2020-07-24: qty 2

## 2020-07-24 MED ORDER — PROCHLORPERAZINE MALEATE 10 MG PO TABS
10.0000 mg | ORAL_TABLET | Freq: Once | ORAL | Status: AC
  Administered 2020-07-24: 10 mg via ORAL

## 2020-07-24 NOTE — Patient Instructions (Addendum)
[facility]  Discharge Instructions: Thank you for choosing [facility] to provide your oncology and hematology care.   If you have a lab appointment with the [facility], please go directly to the [facility] and check in at the registration area.   Wear comfortable clothing and clothing appropriate for easy access to any Portacath or PICC line.   We strive to give you quality time with your provider. You may need to reschedule your appointment if you arrive late (15 or more minutes).  Arriving late affects you and other patients whose appointments are after yours.  Also, if you miss three or more appointments without notifying the office, you may be dismissed from the clinic at the provider's discretion.      For prescription refill requests, have your pharmacy contact our office and allow 72 hours for refills to be completed.    Today you received the following chemotherapy and/or immunotherapy agents: bortezomib      To help prevent nausea and vomiting after your treatment, we encourage you to take your nausea medication as directed.  BELOW ARE SYMPTOMS THAT SHOULD BE REPORTED IMMEDIATELY: . *FEVER GREATER THAN 100.4 F (38 C) OR HIGHER . *CHILLS OR SWEATING . *NAUSEA AND VOMITING THAT IS NOT CONTROLLED WITH YOUR NAUSEA MEDICATION . *UNUSUAL SHORTNESS OF BREATH . *UNUSUAL BRUISING OR BLEEDING . *URINARY PROBLEMS (pain or burning when urinating, or frequent urination) . *BOWEL PROBLEMS (unusual diarrhea, constipation, pain near the anus) . TENDERNESS IN MOUTH AND THROAT WITH OR WITHOUT PRESENCE OF ULCERS (sore throat, sores in mouth, or a toothache) . UNUSUAL RASH, SWELLING OR PAIN  . UNUSUAL VAGINAL DISCHARGE OR ITCHING   Items with * indicate a potential emergency and should be followed up as soon as possible or go to the Emergency Department if any problems should occur.  Please show the CHEMOTHERAPY ALERT CARD or IMMUNOTHERAPY ALERT  CARD at check-in to the Emergency Department and triage nurse.  Should you have questions after your visit or need to cancel or reschedule your appointment, please contact [facility]  Dept: [phone]  and follow the prompts.  Office hours are 8:00 a.m. to 4:30 p.m. Monday - Friday. Please note that voicemails left after 4:00 p.m. may not be returned until the following business day.  We are closed weekends and major holidays. You have access to a nurse at all times for urgent questions. Please call the main number to the clinic Dept: [phone] and follow the prompts.   For any non-urgent questions, you may also contact your provider using MyChart. We now offer e-Visits for anyone 25 and older to request care online for non-urgent symptoms. For details visit [email or web address].   Also download the MyChart app! Go to the app store, search "MyChart", open the app, select [facility], and log in with your MyChart username and password.  Due to Covid, a mask is required upon entering the hospital/clinic. If you do not have a mask, one will be given to you upon arrival. For doctor visits, patients may have 1 support person aged 25 or older with them. For treatment visits, patients cannot have anyone with them due to current Covid guidelines and our immunocompromised population.   Zoledronic Acid Injection (Hypercalcemia, Oncology) What is this medicine? ZOLEDRONIC ACID (ZOE le dron ik AS id) slows calcium loss from bones. It high calcium levels in the blood from some kinds of cancer. It may be used in  other people at risk for bone loss. This medicine may be used for other purposes; ask your health care provider or pharmacist if you have questions. COMMON BRAND NAME(S): Zometa What should I tell my health care provider before I take this medicine? They need to know if you have any of these conditions:  cancer  dehydration  dental disease  kidney  disease  liver disease  low levels of calcium in the blood  lung or breathing disease (asthma)  receiving steroids like dexamethasone or prednisone  an unusual or allergic reaction to zoledronic acid, other medicines, foods, dyes, or preservatives  pregnant or trying to get pregnant  breast-feeding How should I use this medicine? This drug is injected into a vein. It is given by a health care provider in a hospital or clinic setting. Talk to your health care provider about the use of this drug in children. Special care may be needed. Overdosage: If you think you have taken too much of this medicine contact a poison control center or emergency room at once. NOTE: This medicine is only for you. Do not share this medicine with others. What if I miss a dose? Keep appointments for follow-up doses. It is important not to miss your dose. Call your health care provider if you are unable to keep an appointment. What may interact with this medicine?  certain antibiotics given by injection  NSAIDs, medicines for pain and inflammation, like ibuprofen or naproxen  some diuretics like bumetanide, furosemide  teriparatide  thalidomide This list may not describe all possible interactions. Give your health care provider a list of all the medicines, herbs, non-prescription drugs, or dietary supplements you use. Also tell them if you smoke, drink alcohol, or use illegal drugs. Some items may interact with your medicine. What should I watch for while using this medicine? Visit your health care provider for regular checks on your progress. It may be some time before you see the benefit from this drug. Some people who take this drug have severe bone, joint, or muscle pain. This drug may also increase your risk for jaw problems or a broken thigh bone. Tell your health care provider right away if you have severe pain in your jaw, bones, joints, or muscles. Tell you health care provider if you have any  pain that does not go away or that gets worse. Tell your dentist and dental surgeon that you are taking this drug. You should not have major dental surgery while on this drug. See your dentist to have a dental exam and fix any dental problems before starting this drug. Take good care of your teeth while on this drug. Make sure you see your dentist for regular follow-up appointments. You should make sure you get enough calcium and vitamin D while you are taking this drug. Discuss the foods you eat and the vitamins you take with your health care provider. Check with your health care provider if you have severe diarrhea, nausea, and vomiting, or if you sweat a lot. The loss of too much body fluid may make it dangerous for you to take this drug. You may need blood work done while you are taking this drug. Do not become pregnant while taking this drug. Women should inform their health care provider if they wish to become pregnant or think they might be pregnant. There is potential for serious harm to an unborn child. Talk to your health care provider for more information. What side effects may I notice from  receiving this medicine? Side effects that you should report to your doctor or health care provider as soon as possible:  allergic reactions (skin rash, itching or hives; swelling of the face, lips, or tongue)  bone pain  infection (fever, chills, cough, sore throat, pain or trouble passing urine)  jaw pain, especially after dental work  joint pain  kidney injury (trouble passing urine or change in the amount of urine)  low blood pressure (dizziness; feeling faint or lightheaded, falls; unusually weak or tired)  low calcium levels (fast heartbeat; muscle cramps or pain; pain, tingling, or numbness in the hands or feet; seizures)  low magnesium levels (fast, irregular heartbeat; muscle cramp or pain; muscle weakness; tremors; seizures)  low red blood cell counts (trouble breathing; feeling  faint; lightheaded, falls; unusually weak or tired)  muscle pain  redness, blistering, peeling, or loosening of the skin, including inside the mouth  severe diarrhea  swelling of the ankles, feet, hands  trouble breathing Side effects that usually do not require medical attention (report to your doctor or health care provider if they continue or are bothersome):  anxious  constipation  coughing  depressed mood  eye irritation, itching, or pain  fever  general ill feeling or flu-like symptoms  nausea  pain, redness, or irritation at site where injected  trouble sleeping This list may not describe all possible side effects. Call your doctor for medical advice about side effects. You may report side effects to FDA at 1-800-FDA-1088. Where should I keep my medicine? This drug is given in a hospital or clinic. It will not be stored at home. NOTE: This sheet is a summary. It may not cover all possible information. If you have questions about this medicine, talk to your doctor, pharmacist, or health care provider.  2021 Elsevier/Gold Standard (2018-12-20 09:13:00)

## 2020-07-31 ENCOUNTER — Other Ambulatory Visit: Payer: Self-pay

## 2020-07-31 ENCOUNTER — Inpatient Hospital Stay: Payer: Medicare HMO

## 2020-07-31 VITALS — BP 138/94 | HR 98 | Temp 98.5°F | Resp 18

## 2020-07-31 DIAGNOSIS — C9000 Multiple myeloma not having achieved remission: Secondary | ICD-10-CM

## 2020-07-31 DIAGNOSIS — Z5112 Encounter for antineoplastic immunotherapy: Secondary | ICD-10-CM | POA: Diagnosis not present

## 2020-07-31 LAB — CMP (CANCER CENTER ONLY)
ALT: 9 U/L (ref 0–44)
AST: 12 U/L — ABNORMAL LOW (ref 15–41)
Albumin: 3.9 g/dL (ref 3.5–5.0)
Alkaline Phosphatase: 63 U/L (ref 38–126)
Anion gap: 10 (ref 5–15)
BUN: 15 mg/dL (ref 8–23)
CO2: 20 mmol/L — ABNORMAL LOW (ref 22–32)
Calcium: 9.2 mg/dL (ref 8.9–10.3)
Chloride: 103 mmol/L (ref 98–111)
Creatinine: 1.29 mg/dL — ABNORMAL HIGH (ref 0.61–1.24)
GFR, Estimated: 56 mL/min — ABNORMAL LOW (ref >60)
Glucose, Bld: 225 mg/dL — ABNORMAL HIGH (ref 70–99)
Potassium: 4.1 mmol/L (ref 3.5–5.1)
Sodium: 133 mmol/L — ABNORMAL LOW (ref 135–145)
Total Bilirubin: 0.4 mg/dL (ref 0.3–1.2)
Total Protein: 7.9 g/dL (ref 6.5–8.1)

## 2020-07-31 LAB — CBC WITH DIFFERENTIAL (CANCER CENTER ONLY)
Abs Immature Granulocytes: 0.03 10*3/uL (ref 0.00–0.07)
Basophils Absolute: 0 10*3/uL (ref 0.0–0.1)
Basophils Relative: 0 %
Eosinophils Absolute: 0 10*3/uL (ref 0.0–0.5)
Eosinophils Relative: 0 %
HCT: 39.4 % (ref 39.0–52.0)
Hemoglobin: 13.2 g/dL (ref 13.0–17.0)
Immature Granulocytes: 0 %
Lymphocytes Relative: 5 %
Lymphs Abs: 0.4 10*3/uL — ABNORMAL LOW (ref 0.7–4.0)
MCH: 30.6 pg (ref 26.0–34.0)
MCHC: 33.5 g/dL (ref 30.0–36.0)
MCV: 91.2 fL (ref 80.0–100.0)
Monocytes Absolute: 0.1 10*3/uL (ref 0.1–1.0)
Monocytes Relative: 1 %
Neutro Abs: 8.9 10*3/uL — ABNORMAL HIGH (ref 1.7–7.7)
Neutrophils Relative %: 94 %
Platelet Count: 160 10*3/uL (ref 150–400)
RBC: 4.32 MIL/uL (ref 4.22–5.81)
RDW: 13.5 % (ref 11.5–15.5)
WBC Count: 9.4 10*3/uL (ref 4.0–10.5)
nRBC: 0 % (ref 0.0–0.2)

## 2020-07-31 MED ORDER — PROCHLORPERAZINE MALEATE 10 MG PO TABS
10.0000 mg | ORAL_TABLET | Freq: Once | ORAL | Status: AC
  Administered 2020-07-31: 10 mg via ORAL

## 2020-07-31 MED ORDER — BORTEZOMIB CHEMO IV INJECTION 3.5 MG (1MG/ML-GENERIC)
1.0000 mg/m2 | Freq: Once | INTRAVENOUS | Status: AC
  Administered 2020-07-31: 2 mg via INTRAVENOUS
  Filled 2020-07-31: qty 2

## 2020-07-31 MED ORDER — PROCHLORPERAZINE MALEATE 10 MG PO TABS
ORAL_TABLET | ORAL | Status: AC
  Filled 2020-07-31: qty 1

## 2020-07-31 MED ORDER — SODIUM CHLORIDE 0.9 % IV SOLN
Freq: Once | INTRAVENOUS | Status: AC
  Filled 2020-07-31: qty 250

## 2020-07-31 NOTE — Patient Instructions (Signed)
[facility]  Discharge Instructions: Thank you for choosing [facility] to provide your oncology and hematology care.   If you have a lab appointment with the Cancer Center, please go directly to the Cancer Center and check in at the registration area.   Wear comfortable clothing and clothing appropriate for easy access to any Portacath or PICC line.   We strive to give you quality time with your provider. You may need to reschedule your appointment if you arrive late (15 or more minutes).  Arriving late affects you and other patients whose appointments are after yours.  Also, if you miss three or more appointments without notifying the office, you may be dismissed from the clinic at the provider's discretion.      For prescription refill requests, have your pharmacy contact our office and allow 72 hours for refills to be completed.    Today you received the following chemotherapy and/or immunotherapy agents: bortezomib      To help prevent nausea and vomiting after your treatment, we encourage you to take your nausea medication as directed.  BELOW ARE SYMPTOMS THAT SHOULD BE REPORTED IMMEDIATELY: . *FEVER GREATER THAN 100.4 F (38 C) OR HIGHER . *CHILLS OR SWEATING . *NAUSEA AND VOMITING THAT IS NOT CONTROLLED WITH YOUR NAUSEA MEDICATION . *UNUSUAL SHORTNESS OF BREATH . *UNUSUAL BRUISING OR BLEEDING . *URINARY PROBLEMS (pain or burning when urinating, or frequent urination) . *BOWEL PROBLEMS (unusual diarrhea, constipation, pain near the anus) . TENDERNESS IN MOUTH AND THROAT WITH OR WITHOUT PRESENCE OF ULCERS (sore throat, sores in mouth, or a toothache) . UNUSUAL RASH, SWELLING OR PAIN  . UNUSUAL VAGINAL DISCHARGE OR ITCHING   Items with * indicate a potential emergency and should be followed up as soon as possible or go to the Emergency Department if any problems should occur.  Please show the CHEMOTHERAPY ALERT CARD or IMMUNOTHERAPY ALERT  CARD at check-in to the Emergency Department and triage nurse.  Should you have questions after your visit or need to cancel or reschedule your appointment, please contact [facility]  Dept: [phone]  and follow the prompts.  Office hours are 8:00 a.m. to 4:30 p.m. Monday - Friday. Please note that voicemails left after 4:00 p.m. may not be returned until the following business day.  We are closed weekends and major holidays. You have access to a nurse at all times for urgent questions. Please call the main number to the clinic Dept: [phone] and follow the prompts.   For any non-urgent questions, you may also contact your provider using MyChart. We now offer e-Visits for anyone 18 and older to request care online for non-urgent symptoms. For details visit [email or web address].   Also download the MyChart app! Go to the app store, search "MyChart", open the app, select [facility], and log in with your MyChart username and password.  Due to Covid, a mask is required upon entering the hospital/clinic. If you do not have a mask, one will be given to you upon arrival. For doctor visits, patients may have 1 support person aged 18 or older with them. For treatment visits, patients cannot have anyone with them due to current Covid guidelines and our immunocompromised population.   Zoledronic Acid Injection (Hypercalcemia, Oncology) What is this medicine? ZOLEDRONIC ACID (ZOE le dron ik AS id) slows calcium loss from bones. It high calcium levels in the blood from some kinds of cancer. It may be used in   other people at risk for bone loss. This medicine may be used for other purposes; ask your health care provider or pharmacist if you have questions. COMMON BRAND NAME(S): Zometa What should I tell my health care provider before I take this medicine? They need to know if you have any of these conditions:  cancer  dehydration  dental disease  kidney  disease  liver disease  low levels of calcium in the blood  lung or breathing disease (asthma)  receiving steroids like dexamethasone or prednisone  an unusual or allergic reaction to zoledronic acid, other medicines, foods, dyes, or preservatives  pregnant or trying to get pregnant  breast-feeding How should I use this medicine? This drug is injected into a vein. It is given by a health care provider in a hospital or clinic setting. Talk to your health care provider about the use of this drug in children. Special care may be needed. Overdosage: If you think you have taken too much of this medicine contact a poison control center or emergency room at once. NOTE: This medicine is only for you. Do not share this medicine with others. What if I miss a dose? Keep appointments for follow-up doses. It is important not to miss your dose. Call your health care provider if you are unable to keep an appointment. What may interact with this medicine?  certain antibiotics given by injection  NSAIDs, medicines for pain and inflammation, like ibuprofen or naproxen  some diuretics like bumetanide, furosemide  teriparatide  thalidomide This list may not describe all possible interactions. Give your health care provider a list of all the medicines, herbs, non-prescription drugs, or dietary supplements you use. Also tell them if you smoke, drink alcohol, or use illegal drugs. Some items may interact with your medicine. What should I watch for while using this medicine? Visit your health care provider for regular checks on your progress. It may be some time before you see the benefit from this drug. Some people who take this drug have severe bone, joint, or muscle pain. This drug may also increase your risk for jaw problems or a broken thigh bone. Tell your health care provider right away if you have severe pain in your jaw, bones, joints, or muscles. Tell you health care provider if you have any  pain that does not go away or that gets worse. Tell your dentist and dental surgeon that you are taking this drug. You should not have major dental surgery while on this drug. See your dentist to have a dental exam and fix any dental problems before starting this drug. Take good care of your teeth while on this drug. Make sure you see your dentist for regular follow-up appointments. You should make sure you get enough calcium and vitamin D while you are taking this drug. Discuss the foods you eat and the vitamins you take with your health care provider. Check with your health care provider if you have severe diarrhea, nausea, and vomiting, or if you sweat a lot. The loss of too much body fluid may make it dangerous for you to take this drug. You may need blood work done while you are taking this drug. Do not become pregnant while taking this drug. Women should inform their health care provider if they wish to become pregnant or think they might be pregnant. There is potential for serious harm to an unborn child. Talk to your health care provider for more information. What side effects may I notice from  receiving this medicine? Side effects that you should report to your doctor or health care provider as soon as possible:  allergic reactions (skin rash, itching or hives; swelling of the face, lips, or tongue)  bone pain  infection (fever, chills, cough, sore throat, pain or trouble passing urine)  jaw pain, especially after dental work  joint pain  kidney injury (trouble passing urine or change in the amount of urine)  low blood pressure (dizziness; feeling faint or lightheaded, falls; unusually weak or tired)  low calcium levels (fast heartbeat; muscle cramps or pain; pain, tingling, or numbness in the hands or feet; seizures)  low magnesium levels (fast, irregular heartbeat; muscle cramp or pain; muscle weakness; tremors; seizures)  low red blood cell counts (trouble breathing; feeling  faint; lightheaded, falls; unusually weak or tired)  muscle pain  redness, blistering, peeling, or loosening of the skin, including inside the mouth  severe diarrhea  swelling of the ankles, feet, hands  trouble breathing Side effects that usually do not require medical attention (report to your doctor or health care provider if they continue or are bothersome):  anxious  constipation  coughing  depressed mood  eye irritation, itching, or pain  fever  general ill feeling or flu-like symptoms  nausea  pain, redness, or irritation at site where injected  trouble sleeping This list may not describe all possible side effects. Call your doctor for medical advice about side effects. You may report side effects to FDA at 1-800-FDA-1088. Where should I keep my medicine? This drug is given in a hospital or clinic. It will not be stored at home. NOTE: This sheet is a summary. It may not cover all possible information. If you have questions about this medicine, talk to your doctor, pharmacist, or health care provider.  2021 Elsevier/Gold Standard (2018-12-20 09:13:00)

## 2020-08-03 LAB — KAPPA/LAMBDA LIGHT CHAINS
Kappa free light chain: 9.2 mg/L (ref 3.3–19.4)
Kappa, lambda light chain ratio: 0.04 — ABNORMAL LOW (ref 0.26–1.65)
Lambda free light chains: 260.7 mg/L — ABNORMAL HIGH (ref 5.7–26.3)

## 2020-08-05 LAB — MULTIPLE MYELOMA PANEL, SERUM
Albumin SerPl Elph-Mcnc: 3.8 g/dL (ref 2.9–4.4)
Albumin/Glob SerPl: 1.1 (ref 0.7–1.7)
Alpha 1: 0.3 g/dL (ref 0.0–0.4)
Alpha2 Glob SerPl Elph-Mcnc: 0.9 g/dL (ref 0.4–1.0)
B-Globulin SerPl Elph-Mcnc: 1 g/dL (ref 0.7–1.3)
Gamma Glob SerPl Elph-Mcnc: 1.5 g/dL (ref 0.4–1.8)
Globulin, Total: 3.7 g/dL (ref 2.2–3.9)
IgA: 1287 mg/dL — ABNORMAL HIGH (ref 61–437)
IgG (Immunoglobin G), Serum: 424 mg/dL — ABNORMAL LOW (ref 603–1613)
IgM (Immunoglobulin M), Srm: 10 mg/dL — ABNORMAL LOW (ref 15–143)
M Protein SerPl Elph-Mcnc: 1 g/dL — ABNORMAL HIGH
Total Protein ELP: 7.5 g/dL (ref 6.0–8.5)

## 2020-08-07 ENCOUNTER — Inpatient Hospital Stay: Payer: Medicare HMO

## 2020-08-07 ENCOUNTER — Other Ambulatory Visit: Payer: Self-pay

## 2020-08-07 ENCOUNTER — Inpatient Hospital Stay (HOSPITAL_BASED_OUTPATIENT_CLINIC_OR_DEPARTMENT_OTHER): Payer: Medicare HMO | Admitting: Oncology

## 2020-08-07 VITALS — BP 178/95

## 2020-08-07 VITALS — BP 141/99 | HR 98 | Temp 98.7°F | Resp 19 | Wt 176.1 lb

## 2020-08-07 DIAGNOSIS — C9000 Multiple myeloma not having achieved remission: Secondary | ICD-10-CM

## 2020-08-07 DIAGNOSIS — C419 Malignant neoplasm of bone and articular cartilage, unspecified: Secondary | ICD-10-CM

## 2020-08-07 DIAGNOSIS — Z5112 Encounter for antineoplastic immunotherapy: Secondary | ICD-10-CM | POA: Diagnosis not present

## 2020-08-07 LAB — CBC WITH DIFFERENTIAL (CANCER CENTER ONLY)
Abs Immature Granulocytes: 0.04 10*3/uL (ref 0.00–0.07)
Basophils Absolute: 0 10*3/uL (ref 0.0–0.1)
Basophils Relative: 0 %
Eosinophils Absolute: 0 10*3/uL (ref 0.0–0.5)
Eosinophils Relative: 0 %
HCT: 39 % (ref 39.0–52.0)
Hemoglobin: 13.2 g/dL (ref 13.0–17.0)
Immature Granulocytes: 0 %
Lymphocytes Relative: 2 %
Lymphs Abs: 0.3 10*3/uL — ABNORMAL LOW (ref 0.7–4.0)
MCH: 31 pg (ref 26.0–34.0)
MCHC: 33.8 g/dL (ref 30.0–36.0)
MCV: 91.5 fL (ref 80.0–100.0)
Monocytes Absolute: 0.1 10*3/uL (ref 0.1–1.0)
Monocytes Relative: 1 %
Neutro Abs: 14.1 10*3/uL — ABNORMAL HIGH (ref 1.7–7.7)
Neutrophils Relative %: 97 %
Platelet Count: 150 10*3/uL (ref 150–400)
RBC: 4.26 MIL/uL (ref 4.22–5.81)
RDW: 14 % (ref 11.5–15.5)
WBC Count: 14.8 10*3/uL — ABNORMAL HIGH (ref 4.0–10.5)
nRBC: 0 % (ref 0.0–0.2)

## 2020-08-07 LAB — CMP (CANCER CENTER ONLY)
ALT: 10 U/L (ref 0–44)
AST: 12 U/L — ABNORMAL LOW (ref 15–41)
Albumin: 3.8 g/dL (ref 3.5–5.0)
Alkaline Phosphatase: 61 U/L (ref 38–126)
Anion gap: 11 (ref 5–15)
BUN: 14 mg/dL (ref 8–23)
CO2: 20 mmol/L — ABNORMAL LOW (ref 22–32)
Calcium: 8.8 mg/dL — ABNORMAL LOW (ref 8.9–10.3)
Chloride: 103 mmol/L (ref 98–111)
Creatinine: 1.26 mg/dL — ABNORMAL HIGH (ref 0.61–1.24)
GFR, Estimated: 58 mL/min — ABNORMAL LOW (ref >60)
Glucose, Bld: 218 mg/dL — ABNORMAL HIGH (ref 70–99)
Potassium: 4.1 mmol/L (ref 3.5–5.1)
Sodium: 134 mmol/L — ABNORMAL LOW (ref 135–145)
Total Bilirubin: 0.6 mg/dL (ref 0.3–1.2)
Total Protein: 7.6 g/dL (ref 6.5–8.1)

## 2020-08-07 MED ORDER — SODIUM CHLORIDE 0.9 % IV SOLN
Freq: Once | INTRAVENOUS | Status: AC
Start: 2020-08-07 — End: 2020-08-07
  Filled 2020-08-07: qty 250

## 2020-08-07 MED ORDER — PROCHLORPERAZINE MALEATE 10 MG PO TABS
ORAL_TABLET | ORAL | Status: AC
  Filled 2020-08-07: qty 1

## 2020-08-07 MED ORDER — MORPHINE SULFATE ER 30 MG PO TBCR: 30.0000 mg | EXTENDED_RELEASE_TABLET | Freq: Two times a day (BID) | ORAL | 0 refills | Status: DC

## 2020-08-07 MED ORDER — BORTEZOMIB CHEMO IV INJECTION 3.5 MG (1MG/ML-GENERIC)
1.0000 mg/m2 | Freq: Once | INTRAVENOUS | Status: AC
  Administered 2020-08-07: 2 mg via INTRAVENOUS
  Filled 2020-08-07: qty 2

## 2020-08-07 MED ORDER — PROCHLORPERAZINE MALEATE 10 MG PO TABS
10.0000 mg | ORAL_TABLET | Freq: Once | ORAL | Status: AC
  Administered 2020-08-07: 10 mg via ORAL

## 2020-08-07 NOTE — Progress Notes (Signed)
Hematology and Oncology Follow Up Visit  CAMRIN GEARHEART [id number] Aug 17, 1940 80 y.o. 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  He presented with poor prognostic features including 50% plasma cell involvement in the marrow, 3 q. deletion, duplication 1 q, (44;92).   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   Prior Therapy:   He status post right robotic laparoscopic partial nephrectomy on October 03, 2016.  Palliative radiation therapy to the spine.  He completed 20 Gy in 10 fractions on March 24, 2020.  Current therapy:   Velcade and dexamethasone started on January 7 to be given weekly.  He is here for the next cycle of treatment.   Interim History: Mr. Zeidman returns today for a follow-up visit.  Since the last visit, he reports no major changes in his health.  He continues to tolerate current therapy without any complaints.  He denies any nausea, vomiting or peripheral neuropathy.  He denies any recent falls or syncope.  His performance status quality of life continues to improve.  He is ambulating with the help of a cane.  He denies any falls or syncope.    Medications: Updated on review. Current Outpatient Medications  Medication Sig Dispense Refill  . acetaminophen (TYLENOL) 500 MG tablet Take 1,000 mg by mouth daily as needed for mild pain or moderate pain.    Marland Kitchen acyclovir (ZOVIRAX) 400 MG tablet Take 1 tablet (400 mg total) by mouth daily. 90 tablet 3  . allopurinol (ZYLOPRIM) 300 MG tablet Take 300 mg by mouth daily.     Marland Kitchen amLODipine (NORVASC) 10 MG tablet Take 10 mg by mouth daily.    . ASPIRIN LOW DOSE 81 MG EC tablet Take 81 mg by mouth daily.    . Calcium Carb-Cholecalciferol (OYSTER SHELL CALCIUM W/D) 500-200 MG-UNIT TABS TAKE 1 TABLET BY MOUTH TWICE A DAY 180 tablet 1  .  calcium-vitamin D (OSCAL WITH D) 500-200 MG-UNIT tablet Take 1 tablet by mouth 2 (two) times daily. 60 tablet 3  . dexamethasone (DECADRON) 4 MG tablet Take 5 tablets weekly on the day of cancer treatment. 60 tablet 3  . folic acid (FOLVITE) 1 MG tablet Take 1 mg by mouth daily.    . furosemide (LASIX) 20 MG tablet Take 20 mg by mouth 2 (two) times daily.  (Patient not taking: Reported on 07/10/2020)    . labetalol (NORMODYNE) 300 MG tablet Take 300 mg by mouth 2 (two) times daily.  (Patient not taking: Reported on 07/10/2020)    . lidocaine (LIDODERM) 5 % Place 1 patch onto the skin every 12 (twelve) hours. Remove & Discard patch within 12 hours or as directed by MD (Patient not taking: Reported on 07/10/2020) 20 patch 0  . losartan (COZAAR) 100 MG tablet Take 100 mg by mouth daily. (Patient not taking: Reported on 07/10/2020)    . morphine (MS CONTIN) 30 MG 12 hr tablet Take 1 tablet (30 mg total) by mouth every 12 (twelve) hours. 60 tablet 0  . morphine (MSIR) 15 MG tablet Take 1 tablet (15 mg total) by mouth every 4 (four) hours as needed for severe pain. 60 tablet 0  . NARCAN 4 MG/0.1ML LIQD nasal spray kit Place 1 spray into the nose once.    . polyethylene glycol (MIRALAX / GLYCOLAX) 17 g packet Take 17 g by mouth daily  as needed for moderate constipation. 14 each 0  . rosuvastatin (CRESTOR) 5 MG tablet Take 5 mg by mouth daily.    Marland Kitchen senna-docusate (SENOKOT-S) 8.6-50 MG tablet Take 1 tablet by mouth at bedtime. 100 tablet 0  . Vitamin D, Ergocalciferol, (DRISDOL) 1.25 MG (50000 UNIT) CAPS capsule Take 50,000 Units by mouth every 14 (fourteen) days.     No current facility-administered medications for this visit.     Allergies:  Allergies  Allergen Reactions  . Amlodipine Swelling  . Pravastatin Other (See Comments)    Joint pain  . Zestril [Lisinopril] Other (See Comments)    Gout   ( Zestril) gout  . Other Other (See Comments)  . Oxycodone Other (See Comments)    Causes agitation        Physical Exam:  Blood pressure (!) 141/99, pulse 98, temperature 98.7 F (37.1 C), temperature source Tympanic, resp. rate 19, weight 176 lb 1.6 oz (79.9 kg), SpO2 97 %.     ECOG:  1   General appearance: Comfortable appearing without any discomfort Head: Normocephalic without any trauma Oropharynx: Mucous membranes are moist and pink without any thrush or ulcers. Eyes: Pupils are equal and round reactive to light. Lymph nodes: No cervical, supraclavicular, inguinal or axillary lymphadenopathy.   Heart:regular rate and rhythm.  S1 and S2 without leg edema. Lung: Clear without any rhonchi or wheezes.  No dullness to percussion. Abdomin: Soft, nontender, nondistended with good bowel sounds.  No hepatosplenomegaly. Musculoskeletal: No joint deformity or effusion.  Full range of motion noted. Neurological: No deficits noted on motor, sensory and deep tendon reflex exam. Skin: No petechial rash or dryness.  Appeared moist.         Lab Results: Lab Results  Component Value Date   WBC 9.4 07/31/2020   HGB 13.2 07/31/2020   HCT 39.4 07/31/2020   MCV 91.2 07/31/2020   PLT 160 07/31/2020     Chemistry      Component Value Date/Time   NA 133 (L) 07/31/2020 1331   K 4.1 07/31/2020 1331   CL 103 07/31/2020 1331   CO2 20 (L) 07/31/2020 1331   BUN 15 07/31/2020 1331   CREATININE 1.29 (H) 07/31/2020 1331      Component Value Date/Time   CALCIUM 9.2 07/31/2020 1331   ALKPHOS 63 07/31/2020 1331   AST 12 (L) 07/31/2020 1331   ALT 9 07/31/2020 1331   BILITOT 0.4 07/31/2020 1331     Results for NORMON, PETTIJOHN (MRN [id number]) as of 08/07/2020 12:19  Ref. Range 06/26/2020 13:22 07/31/2020 13:31  Kappa free light chain Latest Ref Range: 3.3 - 19.4 mg/L 9.2 9.2  Lamda free light chains Latest Ref Range: 5.7 - 26.3 mg/L 271.4 (H) 260.7 (H)  Kappa, lamda light chain ratio Latest Ref Range: 0.26 - 1.65  0.03 (L) 0.04 (L)    IMPRESSION: 1. 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 No dissection or acute aortic findings. 2. Mild T12 superior endplate compression fracture is new from prior thoracic spine CT. Slight loss of height. No obvious underlying bone lesion. Chronic loss of height of T4 vertebral body, unchanged. 3. 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 There is sclerosis involving the anterior left fifth, sixth, and seventh ribs which may represent healing fractures, however possibility of underlying is raised. Recommend attention on subsequent restaging exams. 4. Coronary artery calcifications.    Impression and Plan:  80 year old with:  1.  Multiple myeloma diagnosed in November 2021.  He presented with IgA lambda and bone involvement including poor prognostic features.  He continues to be on Velcade and dexamethasone with reasonable response although his M spike has plateaued with IgA level slightly declining.  Risks and benefits of continuing this treatment versus adding additional therapy utilizing cyclophosphamide, daratumumab or Revlimid were reiterated.  After discussion today, the plan is to continue with Velcade and dexamethasone and switch to Revlimid in June 2022.   2.  Compression fracture of the spine: No recent exacerbation noted at this time.  CT scan obtained on July 16, 2020 does not show any new lesions.  3.  Antiemetics: Compazine is available to him without any nausea or vomiting.  5.  VZV prophylaxis: I recommended continuing acyclovir at this time.  No reactivation of herpes zoster.   6.  Bone directed therapy: He will receive Zometa every 3 months.  Last infusion was given on May 5 and will be repeated in 3 months.  7.  Chronic pain: He is currently on morphine long-acting with pain being manageable.  8.  Follow-up: He will continue weekly Velcade and MD follow-up and 4 weeks.   30  minutes were dedicated to this  encounter.  The time spent on reviewing laboratory data, treatment options and addressing complications related to his cancer and cancer therapy.     Zola Button, MD 5/20/202212:18 PM

## 2020-08-07 NOTE — Patient Instructions (Signed)
[facility]  Discharge Instructions: Thank you for choosing [facility] to provide your oncology and hematology care.   If you have a lab appointment with the [facility], please go directly to the [facility] and check in at the registration area.   Wear comfortable clothing and clothing appropriate for easy access to any Portacath or PICC line.   We strive to give you quality time with your provider. You may need to reschedule your appointment if you arrive late (15 or more minutes).  Arriving late affects you and other patients whose appointments are after yours.  Also, if you miss three or more appointments without notifying the office, you may be dismissed from the clinic at the provider's discretion.      For prescription refill requests, have your pharmacy contact our office and allow 72 hours for refills to be completed.    Today you received the following chemotherapy and/or immunotherapy agents: bortezomib      To help prevent nausea and vomiting after your treatment, we encourage you to take your nausea medication as directed.  BELOW ARE SYMPTOMS THAT SHOULD BE REPORTED IMMEDIATELY: . *FEVER GREATER THAN 100.4 F (38 C) OR HIGHER . *CHILLS OR SWEATING . *NAUSEA AND VOMITING THAT IS NOT CONTROLLED WITH YOUR NAUSEA MEDICATION . *UNUSUAL SHORTNESS OF BREATH . *UNUSUAL BRUISING OR BLEEDING . *URINARY PROBLEMS (pain or burning when urinating, or frequent urination) . *BOWEL PROBLEMS (unusual diarrhea, constipation, pain near the anus) . TENDERNESS IN MOUTH AND THROAT WITH OR WITHOUT PRESENCE OF ULCERS (sore throat, sores in mouth, or a toothache) . UNUSUAL RASH, SWELLING OR PAIN  . UNUSUAL VAGINAL DISCHARGE OR ITCHING   Items with * indicate a potential emergency and should be followed up as soon as possible or go to the Emergency Department if any problems should occur.  Please show the CHEMOTHERAPY ALERT CARD or IMMUNOTHERAPY ALERT  CARD at check-in to the Emergency Department and triage nurse.  Should you have questions after your visit or need to cancel or reschedule your appointment, please contact [facility]  Dept: [phone]  and follow the prompts.  Office hours are 8:00 a.m. to 4:30 p.m. Monday - Friday. Please note that voicemails left after 4:00 p.m. may not be returned until the following business day.  We are closed weekends and major holidays. You have access to a nurse at all times for urgent questions. Please call the main number to the clinic Dept: [phone] and follow the prompts.   For any non-urgent questions, you may also contact your provider using MyChart. We now offer e-Visits for anyone 74 and older to request care online for non-urgent symptoms. For details visit [email or web address].   Also download the MyChart app! Go to the app store, search "MyChart", open the app, select [facility], and log in with your MyChart username and password.  Due to Covid, a mask is required upon entering the hospital/clinic. If you do not have a mask, one will be given to you upon arrival. For doctor visits, patients may have 1 support person aged 46 or older with them. For treatment visits, patients cannot have anyone with them due to current Covid guidelines and our immunocompromised population.   Zoledronic Acid Injection (Hypercalcemia, Oncology) What is this medicine? ZOLEDRONIC ACID (ZOE le dron ik AS id) slows calcium loss from bones. It high calcium levels in the blood from some kinds of cancer. It may be used in  other people at risk for bone loss. This medicine may be used for other purposes; ask your health care provider or pharmacist if you have questions. COMMON BRAND NAME(S): Zometa What should I tell my health care provider before I take this medicine? They need to know if you have any of these conditions:  cancer  dehydration  dental disease  kidney  disease  liver disease  low levels of calcium in the blood  lung or breathing disease (asthma)  receiving steroids like dexamethasone or prednisone  an unusual or allergic reaction to zoledronic acid, other medicines, foods, dyes, or preservatives  pregnant or trying to get pregnant  breast-feeding How should I use this medicine? This drug is injected into a vein. It is given by a health care provider in a hospital or clinic setting. Talk to your health care provider about the use of this drug in children. Special care may be needed. Overdosage: If you think you have taken too much of this medicine contact a poison control center or emergency room at once. NOTE: This medicine is only for you. Do not share this medicine with others. What if I miss a dose? Keep appointments for follow-up doses. It is important not to miss your dose. Call your health care provider if you are unable to keep an appointment. What may interact with this medicine?  certain antibiotics given by injection  NSAIDs, medicines for pain and inflammation, like ibuprofen or naproxen  some diuretics like bumetanide, furosemide  teriparatide  thalidomide This list may not describe all possible interactions. Give your health care provider a list of all the medicines, herbs, non-prescription drugs, or dietary supplements you use. Also tell them if you smoke, drink alcohol, or use illegal drugs. Some items may interact with your medicine. What should I watch for while using this medicine? Visit your health care provider for regular checks on your progress. It may be some time before you see the benefit from this drug. Some people who take this drug have severe bone, joint, or muscle pain. This drug may also increase your risk for jaw problems or a broken thigh bone. Tell your health care provider right away if you have severe pain in your jaw, bones, joints, or muscles. Tell you health care provider if you have any  pain that does not go away or that gets worse. Tell your dentist and dental surgeon that you are taking this drug. You should not have major dental surgery while on this drug. See your dentist to have a dental exam and fix any dental problems before starting this drug. Take good care of your teeth while on this drug. Make sure you see your dentist for regular follow-up appointments. You should make sure you get enough calcium and vitamin D while you are taking this drug. Discuss the foods you eat and the vitamins you take with your health care provider. Check with your health care provider if you have severe diarrhea, nausea, and vomiting, or if you sweat a lot. The loss of too much body fluid may make it dangerous for you to take this drug. You may need blood work done while you are taking this drug. Do not become pregnant while taking this drug. Women should inform their health care provider if they wish to become pregnant or think they might be pregnant. There is potential for serious harm to an unborn child. Talk to your health care provider for more information. What side effects may I notice from  receiving this medicine? Side effects that you should report to your doctor or health care provider as soon as possible:  allergic reactions (skin rash, itching or hives; swelling of the face, lips, or tongue)  bone pain  infection (fever, chills, cough, sore throat, pain or trouble passing urine)  jaw pain, especially after dental work  joint pain  kidney injury (trouble passing urine or change in the amount of urine)  low blood pressure (dizziness; feeling faint or lightheaded, falls; unusually weak or tired)  low calcium levels (fast heartbeat; muscle cramps or pain; pain, tingling, or numbness in the hands or feet; seizures)  low magnesium levels (fast, irregular heartbeat; muscle cramp or pain; muscle weakness; tremors; seizures)  low red blood cell counts (trouble breathing; feeling  faint; lightheaded, falls; unusually weak or tired)  muscle pain  redness, blistering, peeling, or loosening of the skin, including inside the mouth  severe diarrhea  swelling of the ankles, feet, hands  trouble breathing Side effects that usually do not require medical attention (report to your doctor or health care provider if they continue or are bothersome):  anxious  constipation  coughing  depressed mood  eye irritation, itching, or pain  fever  general ill feeling or flu-like symptoms  nausea  pain, redness, or irritation at site where injected  trouble sleeping This list may not describe all possible side effects. Call your doctor for medical advice about side effects. You may report side effects to FDA at 1-800-FDA-1088. Where should I keep my medicine? This drug is given in a hospital or clinic. It will not be stored at home. NOTE: This sheet is a summary. It may not cover all possible information. If you have questions about this medicine, talk to your doctor, pharmacist, or health care provider.  2021 Elsevier/Gold Standard (2018-12-20 09:13:00)

## 2020-08-14 ENCOUNTER — Inpatient Hospital Stay: Payer: Medicare HMO

## 2020-08-14 ENCOUNTER — Other Ambulatory Visit: Payer: Self-pay

## 2020-08-14 VITALS — BP 173/95 | HR 88 | Temp 98.0°F | Resp 18

## 2020-08-14 DIAGNOSIS — C9000 Multiple myeloma not having achieved remission: Secondary | ICD-10-CM

## 2020-08-14 DIAGNOSIS — Z5112 Encounter for antineoplastic immunotherapy: Secondary | ICD-10-CM | POA: Diagnosis not present

## 2020-08-14 LAB — CBC WITH DIFFERENTIAL (CANCER CENTER ONLY)
Abs Immature Granulocytes: 0.03 10*3/uL (ref 0.00–0.07)
Basophils Absolute: 0 10*3/uL (ref 0.0–0.1)
Basophils Relative: 0 %
Eosinophils Absolute: 0.1 10*3/uL (ref 0.0–0.5)
Eosinophils Relative: 1 %
HCT: 37.3 % — ABNORMAL LOW (ref 39.0–52.0)
Hemoglobin: 12.8 g/dL — ABNORMAL LOW (ref 13.0–17.0)
Immature Granulocytes: 0 %
Lymphocytes Relative: 6 %
Lymphs Abs: 0.6 10*3/uL — ABNORMAL LOW (ref 0.7–4.0)
MCH: 30.8 pg (ref 26.0–34.0)
MCHC: 34.3 g/dL (ref 30.0–36.0)
MCV: 89.7 fL (ref 80.0–100.0)
Monocytes Absolute: 0.3 10*3/uL (ref 0.1–1.0)
Monocytes Relative: 3 %
Neutro Abs: 8.8 10*3/uL — ABNORMAL HIGH (ref 1.7–7.7)
Neutrophils Relative %: 90 %
Platelet Count: 146 10*3/uL — ABNORMAL LOW (ref 150–400)
RBC: 4.16 MIL/uL — ABNORMAL LOW (ref 4.22–5.81)
RDW: 14 % (ref 11.5–15.5)
WBC Count: 9.8 10*3/uL (ref 4.0–10.5)
nRBC: 0 % (ref 0.0–0.2)

## 2020-08-14 LAB — CMP (CANCER CENTER ONLY)
ALT: 7 U/L (ref 0–44)
AST: 10 U/L — ABNORMAL LOW (ref 15–41)
Albumin: 3.6 g/dL (ref 3.5–5.0)
Alkaline Phosphatase: 57 U/L (ref 38–126)
Anion gap: 11 (ref 5–15)
BUN: 20 mg/dL (ref 8–23)
CO2: 23 mmol/L (ref 22–32)
Calcium: 9.3 mg/dL (ref 8.9–10.3)
Chloride: 101 mmol/L (ref 98–111)
Creatinine: 1.31 mg/dL — ABNORMAL HIGH (ref 0.61–1.24)
GFR, Estimated: 55 mL/min — ABNORMAL LOW (ref >60)
Glucose, Bld: 200 mg/dL — ABNORMAL HIGH (ref 70–99)
Potassium: 4.6 mmol/L (ref 3.5–5.1)
Sodium: 135 mmol/L (ref 135–145)
Total Bilirubin: 0.5 mg/dL (ref 0.3–1.2)
Total Protein: 7.3 g/dL (ref 6.5–8.1)

## 2020-08-14 MED ORDER — PROCHLORPERAZINE MALEATE 10 MG PO TABS
10.0000 mg | ORAL_TABLET | Freq: Once | ORAL | Status: AC
  Administered 2020-08-14: 10 mg via ORAL

## 2020-08-14 MED ORDER — PROCHLORPERAZINE MALEATE 10 MG PO TABS
ORAL_TABLET | ORAL | Status: AC
  Filled 2020-08-14: qty 1

## 2020-08-14 MED ORDER — SODIUM CHLORIDE 0.9 % IV SOLN
Freq: Once | INTRAVENOUS | Status: AC
  Filled 2020-08-14: qty 250

## 2020-08-14 MED ORDER — BORTEZOMIB CHEMO IV INJECTION 3.5 MG (1MG/ML-GENERIC)
1.0000 mg/m2 | Freq: Once | INTRAVENOUS | Status: AC
  Administered 2020-08-14: 2 mg via INTRAVENOUS
  Filled 2020-08-14: qty 2

## 2020-08-14 NOTE — Patient Instructions (Signed)
[facility]   ?Discharge Instructions: ?Thank you for choosing [facility] to provide your oncology and hematology care.  ? ?If you have a lab appointment with the Cancer Center, please go directly to the Cancer Center and check in at the registration area. ?  ?Wear comfortable clothing and clothing appropriate for easy access to any Portacath or PICC line.  ? ?We strive to give you quality time with your provider. You may need to reschedule your appointment if you arrive late (15 or more minutes).  Arriving late affects you and other patients whose appointments are after yours.  Also, if you miss three or more appointments without notifying the office, you may be dismissed from the clinic at the provider?s discretion.    ?  ?For prescription refill requests, have your pharmacy contact our office and allow 72 hours for refills to be completed.   ? ?Today you received the following chemotherapy and/or immunotherapy agents: bortezomib    ?  ?To help prevent nausea and vomiting after your treatment, we encourage you to take your nausea medication as directed. ? ?BELOW ARE SYMPTOMS THAT SHOULD BE REPORTED IMMEDIATELY: ?*FEVER GREATER THAN 100.4 F (38 ?C) OR HIGHER ?*CHILLS OR SWEATING ?*NAUSEA AND VOMITING THAT IS NOT CONTROLLED WITH YOUR NAUSEA MEDICATION ?*UNUSUAL SHORTNESS OF BREATH ?*UNUSUAL BRUISING OR BLEEDING ?*URINARY PROBLEMS (pain or burning when urinating, or frequent urination) ?*BOWEL PROBLEMS (unusual diarrhea, constipation, pain near the anus) ?TENDERNESS IN MOUTH AND THROAT WITH OR WITHOUT PRESENCE OF ULCERS (sore throat, sores in mouth, or a toothache) ?UNUSUAL RASH, SWELLING OR PAIN  ?UNUSUAL VAGINAL DISCHARGE OR ITCHING  ? ?Items with * indicate a potential emergency and should be followed up as soon as possible or go to the Emergency Department if any problems should occur. ? ?Please show the CHEMOTHERAPY ALERT CARD or IMMUNOTHERAPY ALERT CARD at check-in  to the Emergency Department and triage nurse. ? ?Should you have questions after your visit or need to cancel or reschedule your appointment, please contact [facility]  Dept: [phone]  and follow the prompts.  Office hours are 8:00 a.m. to 4:30 p.m. Monday - Friday. Please note that voicemails left after 4:00 p.m. may not be returned until the following business day.  We are closed weekends and major holidays. You have access to a nurse at all times for urgent questions. Please call the main number to the clinic Dept: [phone] and follow the prompts. ? ? ?For any non-urgent questions, you may also contact your provider using MyChart. We now offer e-Visits for anyone 18 and older to request care online for non-urgent symptoms. For details visit [email or web address]. ?  ?Also download the MyChart app! Go to the app store, search "MyChart", open the app, select [facility], and log in with your MyChart username and password. ? ?Due to Covid, a mask is required upon entering the hospital/clinic. If you do not have a mask, one will be given to you upon arrival. For doctor visits, patients may have 1 support person aged 18 or older with them. For treatment visits, patients cannot have anyone with them due to current Covid guidelines and our immunocompromised population.  ? ?

## 2020-08-18 ENCOUNTER — Telehealth: Payer: Self-pay | Admitting: Oncology

## 2020-08-18 NOTE — Telephone Encounter (Signed)
Scheduled appt per MD. Pt's wife is aware.

## 2020-08-21 ENCOUNTER — Inpatient Hospital Stay: Payer: Medicare HMO

## 2020-08-21 ENCOUNTER — Inpatient Hospital Stay: Payer: Medicare HMO | Attending: Oncology

## 2020-08-21 ENCOUNTER — Other Ambulatory Visit: Payer: Self-pay

## 2020-08-21 VITALS — BP 145/78 | HR 78 | Temp 98.2°F | Resp 18

## 2020-08-21 DIAGNOSIS — C9000 Multiple myeloma not having achieved remission: Secondary | ICD-10-CM

## 2020-08-21 DIAGNOSIS — Z5112 Encounter for antineoplastic immunotherapy: Secondary | ICD-10-CM | POA: Diagnosis not present

## 2020-08-21 DIAGNOSIS — Z885 Allergy status to narcotic agent status: Secondary | ICD-10-CM | POA: Diagnosis not present

## 2020-08-21 DIAGNOSIS — C641 Malignant neoplasm of right kidney, except renal pelvis: Secondary | ICD-10-CM | POA: Insufficient documentation

## 2020-08-21 DIAGNOSIS — Z79899 Other long term (current) drug therapy: Secondary | ICD-10-CM | POA: Insufficient documentation

## 2020-08-21 DIAGNOSIS — Z888 Allergy status to other drugs, medicaments and biological substances status: Secondary | ICD-10-CM | POA: Diagnosis not present

## 2020-08-21 DIAGNOSIS — Z905 Acquired absence of kidney: Secondary | ICD-10-CM | POA: Insufficient documentation

## 2020-08-21 DIAGNOSIS — G8929 Other chronic pain: Secondary | ICD-10-CM | POA: Insufficient documentation

## 2020-08-21 LAB — CBC WITH DIFFERENTIAL (CANCER CENTER ONLY)
Abs Immature Granulocytes: 0.05 10*3/uL (ref 0.00–0.07)
Basophils Absolute: 0 10*3/uL (ref 0.0–0.1)
Basophils Relative: 0 %
Eosinophils Absolute: 0 10*3/uL (ref 0.0–0.5)
Eosinophils Relative: 0 %
HCT: 36.9 % — ABNORMAL LOW (ref 39.0–52.0)
Hemoglobin: 12.9 g/dL — ABNORMAL LOW (ref 13.0–17.0)
Immature Granulocytes: 1 %
Lymphocytes Relative: 6 %
Lymphs Abs: 0.6 10*3/uL — ABNORMAL LOW (ref 0.7–4.0)
MCH: 31.4 pg (ref 26.0–34.0)
MCHC: 35 g/dL (ref 30.0–36.0)
MCV: 89.8 fL (ref 80.0–100.0)
Monocytes Absolute: 0.1 10*3/uL (ref 0.1–1.0)
Monocytes Relative: 1 %
Neutro Abs: 8.7 10*3/uL — ABNORMAL HIGH (ref 1.7–7.7)
Neutrophils Relative %: 92 %
Platelet Count: 148 10*3/uL — ABNORMAL LOW (ref 150–400)
RBC: 4.11 MIL/uL — ABNORMAL LOW (ref 4.22–5.81)
RDW: 14.1 % (ref 11.5–15.5)
WBC Count: 9.5 10*3/uL (ref 4.0–10.5)
nRBC: 0 % (ref 0.0–0.2)

## 2020-08-21 LAB — CMP (CANCER CENTER ONLY)
ALT: 7 U/L (ref 0–44)
AST: 11 U/L — ABNORMAL LOW (ref 15–41)
Albumin: 3.5 g/dL (ref 3.5–5.0)
Alkaline Phosphatase: 68 U/L (ref 38–126)
Anion gap: 12 (ref 5–15)
BUN: 16 mg/dL (ref 8–23)
CO2: 19 mmol/L — ABNORMAL LOW (ref 22–32)
Calcium: 9 mg/dL (ref 8.9–10.3)
Chloride: 100 mmol/L (ref 98–111)
Creatinine: 1.21 mg/dL (ref 0.61–1.24)
GFR, Estimated: >60 mL/min (ref >60)
Glucose, Bld: 240 mg/dL — ABNORMAL HIGH (ref 70–99)
Potassium: 4.2 mmol/L (ref 3.5–5.1)
Sodium: 131 mmol/L — ABNORMAL LOW (ref 135–145)
Total Bilirubin: 0.5 mg/dL (ref 0.3–1.2)
Total Protein: 7.5 g/dL (ref 6.5–8.1)

## 2020-08-21 MED ORDER — BORTEZOMIB CHEMO IV INJECTION 3.5 MG (1MG/ML-GENERIC)
1.0000 mg/m2 | Freq: Once | INTRAVENOUS | Status: AC
  Administered 2020-08-21: 2 mg via INTRAVENOUS
  Filled 2020-08-21: qty 2

## 2020-08-21 MED ORDER — PROCHLORPERAZINE MALEATE 10 MG PO TABS
10.0000 mg | ORAL_TABLET | Freq: Once | ORAL | Status: AC
  Administered 2020-08-21: 10 mg via ORAL

## 2020-08-21 MED ORDER — SODIUM CHLORIDE 0.9 % IV SOLN
Freq: Once | INTRAVENOUS | Status: AC
  Filled 2020-08-21: qty 250

## 2020-08-21 NOTE — Patient Instructions (Signed)
[facility]   ?Discharge Instructions: ?Thank you for choosing [facility] to provide your oncology and hematology care.  ? ?If you have a lab appointment with the Cancer Center, please go directly to the Cancer Center and check in at the registration area. ?  ?Wear comfortable clothing and clothing appropriate for easy access to any Portacath or PICC line.  ? ?We strive to give you quality time with your provider. You may need to reschedule your appointment if you arrive late (15 or more minutes).  Arriving late affects you and other patients whose appointments are after yours.  Also, if you miss three or more appointments without notifying the office, you may be dismissed from the clinic at the provider?s discretion.    ?  ?For prescription refill requests, have your pharmacy contact our office and allow 72 hours for refills to be completed.   ? ?Today you received the following chemotherapy and/or immunotherapy agents: bortezomib    ?  ?To help prevent nausea and vomiting after your treatment, we encourage you to take your nausea medication as directed. ? ?BELOW ARE SYMPTOMS THAT SHOULD BE REPORTED IMMEDIATELY: ?*FEVER GREATER THAN 100.4 F (38 ?C) OR HIGHER ?*CHILLS OR SWEATING ?*NAUSEA AND VOMITING THAT IS NOT CONTROLLED WITH YOUR NAUSEA MEDICATION ?*UNUSUAL SHORTNESS OF BREATH ?*UNUSUAL BRUISING OR BLEEDING ?*URINARY PROBLEMS (pain or burning when urinating, or frequent urination) ?*BOWEL PROBLEMS (unusual diarrhea, constipation, pain near the anus) ?TENDERNESS IN MOUTH AND THROAT WITH OR WITHOUT PRESENCE OF ULCERS (sore throat, sores in mouth, or a toothache) ?UNUSUAL RASH, SWELLING OR PAIN  ?UNUSUAL VAGINAL DISCHARGE OR ITCHING  ? ?Items with * indicate a potential emergency and should be followed up as soon as possible or go to the Emergency Department if any problems should occur. ? ?Please show the CHEMOTHERAPY ALERT CARD or IMMUNOTHERAPY ALERT CARD at check-in  to the Emergency Department and triage nurse. ? ?Should you have questions after your visit or need to cancel or reschedule your appointment, please contact [facility]  Dept: [phone]  and follow the prompts.  Office hours are 8:00 a.m. to 4:30 p.m. Monday - Friday. Please note that voicemails left after 4:00 p.m. may not be returned until the following business day.  We are closed weekends and major holidays. You have access to a nurse at all times for urgent questions. Please call the main number to the clinic Dept: [phone] and follow the prompts. ? ? ?For any non-urgent questions, you may also contact your provider using MyChart. We now offer e-Visits for anyone 18 and older to request care online for non-urgent symptoms. For details visit [email or web address]. ?  ?Also download the MyChart app! Go to the app store, search "MyChart", open the app, select [facility], and log in with your MyChart username and password. ? ?Due to Covid, a mask is required upon entering the hospital/clinic. If you do not have a mask, one will be given to you upon arrival. For doctor visits, patients may have 1 support person aged 18 or older with them. For treatment visits, patients cannot have anyone with them due to current Covid guidelines and our immunocompromised population.  ? ?

## 2020-08-26 ENCOUNTER — Other Ambulatory Visit: Payer: Self-pay

## 2020-08-26 ENCOUNTER — Encounter: Payer: Self-pay | Admitting: Physical Therapy

## 2020-08-26 ENCOUNTER — Ambulatory Visit (INDEPENDENT_AMBULATORY_CARE_PROVIDER_SITE_OTHER): Payer: Medicare HMO | Admitting: Physical Therapy

## 2020-08-26 DIAGNOSIS — R6889 Other general symptoms and signs: Secondary | ICD-10-CM | POA: Diagnosis not present

## 2020-08-26 DIAGNOSIS — M6281 Muscle weakness (generalized): Secondary | ICD-10-CM

## 2020-08-26 DIAGNOSIS — R2689 Other abnormalities of gait and mobility: Secondary | ICD-10-CM | POA: Diagnosis not present

## 2020-08-26 NOTE — Therapy (Signed)
[facility] [address] [place], [place], [address] Phone: [phone]   Fax:  [phone]  Physical Therapy Evaluation  Patient Details  Name: Devin [name] MRN: [id number] Date of Birth: [birth year] Referring Provider (PT): Garth Bigness   Encounter Date: 08/26/2020   PT End of Session - 08/26/20 1627    Visit Number 1    Number of Visits 12    Date for PT Re-Evaluation 10/07/20    Authorization - Visit Number 1    Authorization - Number of Visits 12    Progress Note Due on Visit 10    PT Start Time 1345    PT Stop Time 1430    PT Time Calculation (min) 45 min    Activity Tolerance Patient tolerated treatment well    Behavior During Therapy [facility] for tasks assessed/performed           Past Medical History:  Diagnosis Date  . Arthritis   . History of pulmonary embolism 04/2016  . Hypertension   . Renal cell carcinoma [facility])     Past Surgical History:  Procedure Laterality Date  . APPENDECTOMY    . DENTAL SURGERY  03/2016   all upper teeth pulled and dentures.   Marland [facility] HERNIA REPAIR  1985&2001  . IR FLUORO GUIDED NEEDLE PLC ASPIRATION/INJECTION LOC  02/05/2020  . right knee replacement   04/2016  . right wrist plate due to fracture     . ROBOT ASSISTED LAPAROSCOPIC NEPHRECTOMY Right 10/03/2016   Procedure: XI ROBOTIC ASSISTED LAPAROSCOPIC  PARTIAL NEPHRECTOMY;  Surgeon: Heloise Purpura, MD;  Location: [facility];  Service: Urology;  Laterality: Right;    There were no vitals filed for this visit.    Subjective Assessment - 08/26/20 1352    Subjective Pt was diagnosed with multiple myeloma in January and had a fall in February requiring Lt hip ORIF. Pt had been recieving home health services and is ready for transition to outpatient. Pt has glute pain in certain positions which eases with time and lying down. Pt is alternating between RW and [facility] for gait. Pt states he has not been walking on grass or uneven surfaces  due to fear of falling. states he has no trouble in/out of shower as he has a shower seat    Pertinent History Multiple myeloma (active treatment), Lt hip ORIF, RA    Limitations Walking;House hold activities    How long can you sit comfortably? 20 minutes    How long can you stand comfortably? 20 minutes    Patient Stated Goals reduce pain and improve falls, walking without AD    Currently in Pain? Yes    Pain Score 2     Pain Location Back    Pain Orientation Lower    Pain Descriptors / Indicators Aching    Pain Type Chronic pain    Pain Radiating Towards bilat LEs    Pain Onset More than a month ago    Pain Frequency Several days a week    Aggravating Factors  prolonged sitting or standing    Pain Relieving Factors out of position, lying down              [facility] PT Assessment - 08/26/20 0001      Assessment   Medical Diagnosis repeated falls    Referring Provider (PT) Garth Bigness    Prior Therapy HHPT      Precautions   Precaution Comments active cancer, rheumatoid arthritis  Balance Screen   Has the patient fallen in the past 6 months Yes    How many times? 1    Has the patient had a decrease in activity level because of a fear of falling?  Yes    Is the patient reluctant to leave their home because of a fear of falling?  Yes      Home Environment   Additional Comments pt has 2 steps to enter home, lives with wife      Prior Function   Level of Independence Independent      ROM / Strength   AROM / PROM / Strength Strength      Strength   Strength Assessment Site Hip;Knee    Right/Left Hip Right;Left    Right Hip Flexion 3+/5    Right Hip ABduction 3/5    Left Hip Flexion 4-/5    Left Hip ABduction 3/5    Right/Left Knee Right;Left    Right Knee Flexion 4/5    Right Knee Extension 5/5    Left Knee Flexion 4-/5    Left Knee Extension 5/5      Flexibility   Soft Tissue Assessment /Muscle Length yes    Hamstrings decreased bilat    Piriformis  decreased bilat      Ambulation/Gait   Assistive device Straight cane    Gait Pattern Wide base of support      Standardized Balance Assessment   Standardized Balance Assessment Berg Balance Test      Berg Balance Test   Sit to Stand Able to stand without using hands and stabilize independently    Standing Unsupported Able to stand 2 minutes with supervision    Sitting with Back Unsupported but Feet Supported on Floor or Stool Able to sit safely and securely 2 minutes    Stand to Sit Controls descent by using hands    Transfers Able to transfer safely, definite need of hands    Standing Unsupported with Eyes Closed Able to stand 10 seconds with supervision    Standing Unsupported with Feet Together Able to place feet together independently and stand for 1 minute with supervision    From Standing, Reach Forward with Outstretched Arm Can reach forward >12 cm safely (5")    From Standing Position, Pick up Object from Floor Able to pick up shoe, needs supervision    From Standing Position, Turn to Look Behind Over each Shoulder Turn sideways only but maintains balance    Turn 360 Degrees Needs close supervision or verbal cueing    Standing Unsupported, Alternately Place Feet on Step/Stool Able to complete >2 steps/needs minimal assist    Standing Unsupported, One Foot in Front Needs help to step but can hold 15 seconds    Standing on One Leg Unable to try or needs assist to prevent fall    Total Score 34                      Objective measurements completed on examination: See above findings.       [place] Adult PT Treatment/Exercise - 08/26/20 0001      Exercises   Exercises Knee/Hip      Knee/Hip Exercises: Stretches   Passive Hamstring Stretch Both;30 seconds    Passive Hamstring Stretch Limitations seated    Quad Stretch Both;30 seconds    Quad Stretch Limitations holding counter    Piriformis Stretch Both;30 seconds    Piriformis Stretch Limitations seated  figure 4  Knee/Hip Exercises: Standing   Hip Abduction Both;10 reps    SLS up to 30 sec bilat    Other Standing Knee Exercises tandem stance up to 30 sec bilat    Other Standing Knee Exercises tap ups 4'' step 1 UE support                  PT Education - 08/26/20 1615    Education Details PT POC and goals, HEP    Person(s) Educated Patient    Methods Explanation;Demonstration;Handout    Comprehension Returned demonstration;Verbalized understanding               PT Long Term Goals - 08/26/20 1651      PT LONG TERM GOAL #1   Title Pt will be independent with HEP    Time 6    Period Weeks    Status New    Target Date 10/07/20      PT LONG TERM GOAL #2   Title Pt will improve Berg balance score to >= 42/56 to demo improved balance    Baseline 34    Time 6    Period Weeks    Status New    Target Date 10/07/20      PT LONG TERM GOAL #3   Title Pt will improve bilat hip strength to 4+/5 to tolerate walking and standing with decreased pain    Time 6    Period Weeks    Status New    Target Date 10/07/20                  Plan - 08/26/20 1628    Clinical Impression Statement Pt is a 80 y/o male who presents with c/o occasional back pain and frequent falls. Pt with decreased strength, flexibility, balance and activity tolerance and will benefit from skilled PT to address deficits and improve functional mobility    Personal Factors and Comorbidities Comorbidity 3+    Examination-Activity Limitations Locomotion Level;Sit;Stand    Examination-Participation Restrictions Yard Work;Community Activity    Stability/Clinical Decision Making Evolving/Moderate complexity    Clinical Decision Making Moderate    Rehab Potential Good    PT Frequency 2x / week    PT Duration 6 weeks    PT Treatment/Interventions Manual techniques;Therapeutic activities;Patient/family education;Balance training;Neuromuscular re-education;Therapeutic exercise;Cryotherapy;Stair  training;Gait training;DME Instruction;Taping;Passive range of motion    PT Next Visit Plan assess HEP, dynamic gait and balance training, hip strength    PT Home Exercise Plan [email or web address]    Consulted and Agree with Plan of Care Patient           Patient will benefit from skilled therapeutic intervention in order to improve the following deficits and impairments:  Pain,Impaired flexibility,Decreased strength,Decreased coordination,Decreased activity tolerance,Decreased balance,Difficulty walking  Visit Diagnosis: Balance disorder - Plan: PT plan of care cert/re-cert  Muscle weakness (generalized) - Plan: PT plan of care cert/re-cert  Decreased activity tolerance - Plan: PT plan of care cert/re-cert     Problem List Patient Active Problem List   Diagnosis Date Noted  . Fracture of L5 vertebra ([place]) 03/31/2020  . Intractable pain 03/31/2020  . Impaired ambulation 03/31/2020  . Closed L5 vertebral fracture ([place]) 03/31/2020  . Chronic kidney disease, stage 3 unspecified ([place]) 03/03/2020  . 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/2020  . Essential hypertension 03/03/2020  . Hardening of the aorta (main artery of the heart) ([place]) 03/03/2020  . Iron deficiency anemia 03/03/2020  . Multiple myeloma ([place]) 03/03/2020  . Obesity  03/03/2020  . Personal history of colonic polyps 03/03/2020  . Personal history of pulmonary embolism 03/03/2020  . Prediabetes 03/03/2020  . Primary gout 03/03/2020  . Pure hypercholesterolemia 03/03/2020  . 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[place]) 03/03/2020  . Thoracic aortic aneurysm without rupture ([place]) 03/03/2020  . Type 2 diabetes mellitus ([place]) 03/03/2020  . Vitamin D deficiency 03/03/2020  . Malignant tumor of kidney ([place]) 03/03/2020  . 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 Renal lesion 05/06/2016  . Delirium 05/05/2016  . Pulmonary embolus ([place]) 05/05/2016  . 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 Osteoarthritis of right knee  04/20/2016  . Rheumatoid arthritis involving right knee ([place]) 04/20/2016  . Inguinal hernia without mention of obstruction or gangrene, unilateral or unspecified, (not specified as recurrent)-left 08/10/2011   Mechel Haggard, PT  Zalia Hautala 08/26/2020, 4:56 PM  [facility] [place], [place], [address] Phone: [phone]   Fax:  [phone]  Name: ITHIEL LIEBLER MRN: [id number] Date of Birth: February 27, 1941

## 2020-08-26 NOTE — Patient Instructions (Signed)
Access Code: [email or web address] URL: [email or web address] Date: 08/26/2020 Prepared by: Isabelle Course  Exercises Seated Hamstring Stretch - 1 x daily - 7 x weekly - 3 sets - 1 reps - 20-30 seconds hold Seated Figure 4 Piriformis Stretch - 1 x daily - 7 x weekly - 3 sets - 1 reps - 20-30 seconds hold Standing Single Leg Stance with Counter Support - 1 x daily - 7 x weekly - 3 sets - 1 reps - up to 30 seconds hold Standing Tandem Balance with Counter Support - 1 x daily - 7 x weekly - 3 sets - 1 reps - 20-30 seconds hold Forward Step Touch - 1 x daily - 7 x weekly - 3 sets - 10 reps Standing Hip Abduction with Counter Support - 1 x daily - 7 x weekly - 3 sets - 10 reps

## 2020-08-27 ENCOUNTER — Other Ambulatory Visit: Payer: Self-pay | Admitting: Oncology

## 2020-08-27 ENCOUNTER — Telehealth: Payer: Self-pay | Admitting: *Deleted

## 2020-08-27 MED ORDER — MORPHINE SULFATE 15 MG PO TABS: 15.0000 mg | ORAL_TABLET | ORAL | 0 refills | Status: DC | PRN

## 2020-08-27 NOTE — Telephone Encounter (Signed)
Patients wife called requesting refill of morphine 15 mg to be refilled to [facility] [place].  Routed to Dr Alen Blew to review and advise.

## 2020-08-28 ENCOUNTER — Other Ambulatory Visit: Payer: Self-pay

## 2020-08-28 ENCOUNTER — Inpatient Hospital Stay: Payer: Medicare HMO

## 2020-08-28 VITALS — BP 152/90 | HR 77 | Temp 98.9°F | Resp 16

## 2020-08-28 DIAGNOSIS — Z79899 Other long term (current) drug therapy: Secondary | ICD-10-CM | POA: Diagnosis not present

## 2020-08-28 DIAGNOSIS — C9000 Multiple myeloma not having achieved remission: Secondary | ICD-10-CM

## 2020-08-28 DIAGNOSIS — G8929 Other chronic pain: Secondary | ICD-10-CM | POA: Diagnosis not present

## 2020-08-28 DIAGNOSIS — Z888 Allergy status to other drugs, medicaments and biological substances status: Secondary | ICD-10-CM | POA: Diagnosis not present

## 2020-08-28 DIAGNOSIS — Z905 Acquired absence of kidney: Secondary | ICD-10-CM | POA: Diagnosis not present

## 2020-08-28 DIAGNOSIS — C641 Malignant neoplasm of right kidney, except renal pelvis: Secondary | ICD-10-CM | POA: Diagnosis not present

## 2020-08-28 DIAGNOSIS — Z885 Allergy status to narcotic agent status: Secondary | ICD-10-CM | POA: Diagnosis not present

## 2020-08-28 DIAGNOSIS — Z5112 Encounter for antineoplastic immunotherapy: Secondary | ICD-10-CM | POA: Diagnosis not present

## 2020-08-28 LAB — CBC WITH DIFFERENTIAL (CANCER CENTER ONLY)
Abs Immature Granulocytes: 0.04 10*3/uL (ref 0.00–0.07)
Basophils Absolute: 0 10*3/uL (ref 0.0–0.1)
Basophils Relative: 0 %
Eosinophils Absolute: 0 10*3/uL (ref 0.0–0.5)
Eosinophils Relative: 0 %
HCT: 36.7 % — ABNORMAL LOW (ref 39.0–52.0)
Hemoglobin: 12.6 g/dL — ABNORMAL LOW (ref 13.0–17.0)
Immature Granulocytes: 1 %
Lymphocytes Relative: 6 %
Lymphs Abs: 0.5 10*3/uL — ABNORMAL LOW (ref 0.7–4.0)
MCH: 30.8 pg (ref 26.0–34.0)
MCHC: 34.3 g/dL (ref 30.0–36.0)
MCV: 89.7 fL (ref 80.0–100.0)
Monocytes Absolute: 0.2 10*3/uL (ref 0.1–1.0)
Monocytes Relative: 2 %
Neutro Abs: 7.9 10*3/uL — ABNORMAL HIGH (ref 1.7–7.7)
Neutrophils Relative %: 91 %
Platelet Count: 145 10*3/uL — ABNORMAL LOW (ref 150–400)
RBC: 4.09 MIL/uL — ABNORMAL LOW (ref 4.22–5.81)
RDW: 14.3 % (ref 11.5–15.5)
WBC Count: 8.7 10*3/uL (ref 4.0–10.5)
nRBC: 0 % (ref 0.0–0.2)

## 2020-08-28 LAB — CMP (CANCER CENTER ONLY)
ALT: 11 U/L (ref 0–44)
AST: 11 U/L — ABNORMAL LOW (ref 15–41)
Albumin: 3.5 g/dL (ref 3.5–5.0)
Alkaline Phosphatase: 63 U/L (ref 38–126)
Anion gap: 10 (ref 5–15)
BUN: 16 mg/dL (ref 8–23)
CO2: 21 mmol/L — ABNORMAL LOW (ref 22–32)
Calcium: 8.8 mg/dL — ABNORMAL LOW (ref 8.9–10.3)
Chloride: 101 mmol/L (ref 98–111)
Creatinine: 1.29 mg/dL — ABNORMAL HIGH (ref 0.61–1.24)
GFR, Estimated: 56 mL/min — ABNORMAL LOW (ref >60)
Glucose, Bld: 213 mg/dL — ABNORMAL HIGH (ref 70–99)
Potassium: 4.5 mmol/L (ref 3.5–5.1)
Sodium: 132 mmol/L — ABNORMAL LOW (ref 135–145)
Total Bilirubin: 0.7 mg/dL (ref 0.3–1.2)
Total Protein: 7.5 g/dL (ref 6.5–8.1)

## 2020-08-28 MED ORDER — PROCHLORPERAZINE MALEATE 10 MG PO TABS
ORAL_TABLET | ORAL | Status: AC
  Filled 2020-08-28: qty 1

## 2020-08-28 MED ORDER — BORTEZOMIB CHEMO IV INJECTION 3.5 MG (1MG/ML-GENERIC)
1.0000 mg/m2 | Freq: Once | INTRAVENOUS | Status: AC
  Administered 2020-08-28: 2 mg via INTRAVENOUS
  Filled 2020-08-28: qty 2

## 2020-08-28 MED ORDER — SODIUM CHLORIDE 0.9 % IV SOLN
Freq: Once | INTRAVENOUS | Status: AC
  Filled 2020-08-28: qty 250

## 2020-08-28 MED ORDER — PROCHLORPERAZINE MALEATE 10 MG PO TABS
10.0000 mg | ORAL_TABLET | Freq: Once | ORAL | Status: AC
  Administered 2020-08-28: 10 mg via ORAL

## 2020-08-28 NOTE — Patient Instructions (Signed)
[facility]   ?Discharge Instructions: ?Thank you for choosing [facility] to provide your oncology and hematology care.  ? ?If you have a lab appointment with the Cancer Center, please go directly to the Cancer Center and check in at the registration area. ?  ?Wear comfortable clothing and clothing appropriate for easy access to any Portacath or PICC line.  ? ?We strive to give you quality time with your provider. You may need to reschedule your appointment if you arrive late (15 or more minutes).  Arriving late affects you and other patients whose appointments are after yours.  Also, if you miss three or more appointments without notifying the office, you may be dismissed from the clinic at the provider?s discretion.    ?  ?For prescription refill requests, have your pharmacy contact our office and allow 72 hours for refills to be completed.   ? ?Today you received the following chemotherapy and/or immunotherapy agents: [email or web address]    ?  ?To help prevent nausea and vomiting after your treatment, we encourage you to take your nausea medication as directed. ? ?BELOW ARE SYMPTOMS THAT SHOULD BE REPORTED IMMEDIATELY: ?*FEVER GREATER THAN 100.4 F (38 ?C) OR HIGHER ?*CHILLS OR SWEATING ?*NAUSEA AND VOMITING THAT IS NOT CONTROLLED WITH YOUR NAUSEA MEDICATION ?*UNUSUAL SHORTNESS OF BREATH ?*UNUSUAL BRUISING OR BLEEDING ?*URINARY PROBLEMS (pain or burning when urinating, or frequent urination) ?*BOWEL PROBLEMS (unusual diarrhea, constipation, pain near the anus) ?TENDERNESS IN MOUTH AND THROAT WITH OR WITHOUT PRESENCE OF ULCERS (sore throat, sores in mouth, or a toothache) ?UNUSUAL RASH, SWELLING OR PAIN  ?UNUSUAL VAGINAL DISCHARGE OR ITCHING  ? ?Items with * indicate a potential emergency and should be followed up as soon as possible or go to the Emergency Department if any problems should occur. ? ?Please show the CHEMOTHERAPY ALERT CARD or IMMUNOTHERAPY ALERT CARD at check-in  to the Emergency Department and triage nurse. ? ?Should you have questions after your visit or need to cancel or reschedule your appointment, please contact [facility]  Dept: [phone]  and follow the prompts.  Office hours are 8:00 a.m. to 4:30 p.m. Monday - Friday. Please note that voicemails left after 4:00 p.m. may not be returned until the following business day.  We are closed weekends and major holidays. You have access to a nurse at all times for urgent questions. Please call the main number to the clinic Dept: [phone] and follow the prompts. ? ? ?For any non-urgent questions, you may also contact your provider using MyChart. We now offer e-Visits for anyone 18 and older to request care online for non-urgent symptoms. For details visit [email or web address]. ?  ?Also download the MyChart app! Go to the app store, search "MyChart", open the app, select [place], and log in with your MyChart username and password. ? ?Due to Covid, a mask is required upon entering the hospital/clinic. If you do not have a mask, one will be given to you upon arrival. For doctor visits, patients may have 1 support person aged 18 or older with them. For treatment visits, patients cannot have anyone with them due to current Covid guidelines and our immunocompromised population.  ? ?

## 2020-08-31 ENCOUNTER — Encounter: Payer: Self-pay | Admitting: Physical Therapy

## 2020-08-31 ENCOUNTER — Ambulatory Visit (INDEPENDENT_AMBULATORY_CARE_PROVIDER_SITE_OTHER): Payer: Medicare HMO | Admitting: Physical Therapy

## 2020-08-31 ENCOUNTER — Other Ambulatory Visit: Payer: Self-pay

## 2020-08-31 DIAGNOSIS — R2689 Other abnormalities of gait and mobility: Secondary | ICD-10-CM | POA: Diagnosis not present

## 2020-08-31 DIAGNOSIS — M6281 Muscle weakness (generalized): Secondary | ICD-10-CM | POA: Diagnosis not present

## 2020-08-31 DIAGNOSIS — R6889 Other general symptoms and signs: Secondary | ICD-10-CM

## 2020-08-31 NOTE — Therapy (Signed)
[place], [place], [address] Phone: [phone]   Fax:  [phone]  Physical Therapy Treatment  Patient Details  Name: Devin [name] MRN: [id number] Date of Birth: 1940/08/02 Referring Provider (PT): Sela Hilding   Encounter Date: 08/31/2020   PT End of Session - 08/31/20 1012     Visit Number 2    Number of Visits 12    Date for PT Re-Evaluation 10/07/20    Authorization - Visit Number 2    Authorization - Number of Visits 12    Progress Note Due on Visit 10    PT Start Time 6659    PT Stop Time 1055    PT Time Calculation (min) 40 min    Activity Tolerance Patient tolerated treatment well    Behavior During Therapy [facility] for tasks assessed/performed             Past Medical History:  Diagnosis Date   Arthritis    History of pulmonary embolism 04/2016   Hypertension    Renal cell carcinoma [facility])     Past Surgical History:  Procedure Laterality Date   APPENDECTOMY     DENTAL SURGERY  03/2016   all upper teeth pulled and dentures.    HERNIA REPAIR  1985&2001   IR FLUORO GUIDED NEEDLE PLC ASPIRATION/INJECTION LOC  02/05/2020   right knee replacement   04/2016   right wrist plate due to fracture      ROBOT ASSISTED LAPAROSCOPIC NEPHRECTOMY Right 10/03/2016   Procedure: XI ROBOTIC ASSISTED LAPAROSCOPIC  PARTIAL NEPHRECTOMY;  Surgeon: Raynelle Bring, MD;  [facility];  Service: Urology;  Laterality: Right;    There were no vitals filed for this visit.   Subjective Assessment - 08/31/20 1014     Subjective "These exercises are killing my hips".  Pt reports he took pain medicine due to pain being 10/10.      Patient Stated Goals reduce pain and improve falls, walking without AD    Currently in Pain? Yes    Pain Score 7     Pain Location Hip    Pain Orientation Right;Left    Pain Descriptors / Indicators Aching    Aggravating Factors  exercises, prolonged sitting and  standing    Pain Relieving Factors changing positions, lying down, pain medicine.                [facility] PT Assessment - 08/31/20 0001       Assessment   Medical Diagnosis repeated falls    Referring Provider (PT) Sela Hilding    Prior Therapy HHPT              [place] Adult PT Treatment/Exercise - 08/31/20 0001       Knee/Hip Exercises: Stretches   Passive Hamstring Stretch Right;Left;2 reps;20 seconds   seated   Piriformis Stretch Right;Left;2 reps;20 seconds   seated, both fig 4 push down and pulling knee towards opp shoulder.     Knee/Hip Exercises: Aerobic   Nustep L4: legs only x 5 min for warm up    Other Aerobic single laps around gym with RW to decrease stiffness.      Knee/Hip Exercises: Standing   Hip Abduction Stengthening;Right;Left;1 set;15 reps;Knee straight    SLS 10 sec, 2 reps each leg    Other Standing Knee Exercises tandem stance x 15 sec x 2 reps each leg.    Other Standing Knee Exercises foot taps to 6" step with  UE support x 10 each leg. side stepping at counter with increased step height x 2 reps Rt/Lt of 8 ft.      Knee/Hip Exercises: Seated   Sit to Sand 5 reps;with UE support;without UE support                 PT Long Term Goals - 08/26/20 1651       PT LONG TERM GOAL #1   Title Pt will be independent with HEP    Time 6    Period Weeks    Status New    Target Date 10/07/20      PT LONG TERM GOAL #2   Title Pt will improve Berg balance score to >= 42/56 to demo improved balance    Baseline 34    Time 6    Period Weeks    Status New    Target Date 10/07/20      PT LONG TERM GOAL #3   Title Pt will improve bilat hip strength to 4+/5 to tolerate walking and standing with decreased pain    Time 6    Period Weeks    Status New    Target Date 10/07/20                   Plan - 08/31/20 1042     Clinical Impression Statement Reviewed exercises from HEP.  Made slight modifications (reduced time for SLS) and  had pt include laps around gym in between ever 2 exercises to reduce stiffness. Good tolerance to these exercises today without increase   Lt hip abdct challenging, per pt and Lt hip ER remains limited. Goals are ongoing.    Personal Factors and Comorbidities Comorbidity 3+    Examination-Activity Limitations Locomotion Level;Sit;Stand    Examination-Participation Restrictions Yard Work;Community Activity    Stability/Clinical Decision Making Evolving/Moderate complexity    Rehab Potential Good    PT Frequency 2x / week    PT Duration 6 weeks    PT Treatment/Interventions Manual techniques;Therapeutic activities;Patient/family education;Balance training;Neuromuscular re-education;Therapeutic exercise;Cryotherapy;Stair training;Gait training;DME Instruction;Taping;Passive range of motion    PT Next Visit Plan dynamic gait and balance training, hip strength.  progress HEP to tolerance.    PT Home Exercise Plan [email or web address]    Consulted and Agree with Plan of Care Patient             Patient will benefit from skilled therapeutic intervention in order to improve the following deficits and impairments:  Pain, Impaired flexibility, Decreased strength, Decreased coordination, Decreased activity tolerance, Decreased balance, Difficulty walking  Visit Diagnosis: Balance disorder  Muscle weakness (generalized)  Decreased activity tolerance     Problem List Patient Active Problem List   Diagnosis Date Noted   Fracture of L5 vertebra ([place]) 03/31/2020   Intractable pain 03/31/2020   Impaired ambulation 03/31/2020   Closed L5 vertebral fracture ([place]) 03/31/2020   Chronic kidney disease, stage 3 unspecified ([place]) 03/03/2020   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/2020   Essential hypertension 03/03/2020   Hardening of the aorta (main artery of the heart) ([place]) 03/03/2020   Iron deficiency anemia 03/03/2020   Multiple myeloma ([place]) 03/03/2020   Obesity 03/03/2020   Personal  history of colonic polyps 03/03/2020   Personal history of pulmonary embolism 03/03/2020   Prediabetes 03/03/2020   Primary gout 03/03/2020   Pure hypercholesterolemia 03/03/2020   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[place]) 03/03/2020   Thoracic aortic aneurysm without rupture ([place]) 03/03/2020   Type 2 diabetes mellitus ([place])  03/03/2020   Vitamin D deficiency 03/03/2020   Malignant tumor of kidney ([place]) 03/03/2020   Neoplasm of right kidney 10/03/2016   Renal lesion 05/06/2016   Delirium 05/05/2016   Pulmonary embolus ([place]) 05/05/2016   Status post total right knee replacement 05/03/2016   Osteoarthritis of right knee 04/20/2016   Rheumatoid arthritis involving right knee ([place]) 04/20/2016   Inguinal hernia without mention of obstruction or gangrene, unilateral or unspecified, (not specified as recurrent)-left 08/10/2011    Kerin Perna, PTA 08/31/20 11:08 AM   [place], [place], [address] Phone: [phone]   Fax:  [phone]  Name: Devin [name] MRN: [id number] Date of Birth: [birth year]

## 2020-09-02 ENCOUNTER — Other Ambulatory Visit: Payer: Self-pay

## 2020-09-02 ENCOUNTER — Ambulatory Visit (INDEPENDENT_AMBULATORY_CARE_PROVIDER_SITE_OTHER): Payer: Medicare HMO | Admitting: Physical Therapy

## 2020-09-02 DIAGNOSIS — M6281 Muscle weakness (generalized): Secondary | ICD-10-CM | POA: Diagnosis not present

## 2020-09-02 DIAGNOSIS — R6889 Other general symptoms and signs: Secondary | ICD-10-CM

## 2020-09-02 DIAGNOSIS — R2689 Other abnormalities of gait and mobility: Secondary | ICD-10-CM | POA: Diagnosis not present

## 2020-09-02 NOTE — Therapy (Signed)
[place], [place], [address] Phone: [phone]   Fax:  [phone]  Physical Therapy Treatment  Patient Details  Name: Devin [name] MRN: [id number] Date of Birth: 10/27/1940 Referring Provider (PT): Sela Hilding   Encounter Date: 09/02/2020   PT End of Session - 09/02/20 1428     Visit Number 3    Number of Visits 12    Date for PT Re-Evaluation 10/07/20    Authorization - Visit Number 3    Authorization - Number of Visits 12    Progress Note Due on Visit 10    PT Start Time 0601    PT Stop Time 1430    PT Time Calculation (min) 45 min    Activity Tolerance Patient tolerated treatment well    Behavior During Therapy [facility] for tasks assessed/performed             Past Medical History:  Diagnosis Date   Arthritis    History of pulmonary embolism 04/2016   Hypertension    Renal cell carcinoma [facility])     Past Surgical History:  Procedure Laterality Date   APPENDECTOMY     DENTAL SURGERY  03/2016   all upper teeth pulled and dentures.    HERNIA REPAIR  1985&2001   IR FLUORO GUIDED NEEDLE PLC ASPIRATION/INJECTION LOC  02/05/2020   right knee replacement   04/2016   right wrist plate due to fracture      ROBOT ASSISTED LAPAROSCOPIC NEPHRECTOMY Right 10/03/2016   Procedure: XI ROBOTIC ASSISTED LAPAROSCOPIC  PARTIAL NEPHRECTOMY;  Surgeon: Raynelle Bring, MD;  [facility]: [facility];  Service: Urology;  Laterality: Right;    There were no vitals filed for this visit.   Subjective Assessment - 09/02/20 1345     Subjective Pt states he has taken a pain pill and feels better today. Pt still using SPC in the house and RW in community    Patient Stated Goals reduce pain and improve falls, walking without AD    Pain Score 2     Pain Location Hip    Pain Orientation Right;Left    Pain Descriptors / Indicators Sore    Pain Type Chronic pain                [facility] PT Assessment -  09/02/20 0001       Assessment   Medical Diagnosis repeated falls    Referring Provider (PT) Sela Hilding    Prior Therapy HHPT      Strength   Right Hip ABduction 3/5    Left Hip ABduction 3/5                           [facility] Adult PT Treatment/Exercise - 09/02/20 0001       Ambulation/Gait   Gait Comments gait without AD with min A/HHA for gait 100' x 2, sidestepping and backward gait with focus on improving balance and endurance      Knee/Hip Exercises: Stretches   Passive Hamstring Stretch Right;Left;2 reps;20 seconds   seated   Piriformis Stretch Right;Left;2 reps;20 seconds   seated, both fig 4 push down and pulling knee towards opp shoulder.     Knee/Hip Exercises: Aerobic   Nustep L4: legs only x 5 min for warm up      Knee/Hip Exercises: Standing   Lateral Step Up Right;Left;10 reps;Step Height: 6"    Forward Step Up Right;Left;10 reps;Hand Hold:  2;Step Height: 6"    Rocker Board 1 minute    Rocker Board Limitations both directions    SLS 10 sec, 2 reps each leg    Other Standing Knee Exercises tandem stance x 15 sec x 2 reps each leg.    Other Standing Knee Exercises tap ups 6'' step with HHA/min A with 1 UE support                         PT Long Term Goals - 08/26/20 1651       PT LONG TERM GOAL #1   Title Pt will be independent with HEP    Time 6    Period Weeks    Status New    Target Date 10/07/20      PT LONG TERM GOAL #2   Title Pt will improve Berg balance score to >= 42/56 to demo improved balance    Baseline 34    Time 6    Period Weeks    Status New    Target Date 10/07/20      PT LONG TERM GOAL #3   Title Pt will improve bilat hip strength to 4+/5 to tolerate walking and standing with decreased pain    Time 6    Period Weeks    Status New    Target Date 10/07/20                   Plan - 09/02/20 1429     Clinical Impression Statement Pt with good tolerance to seated stretches this  visit. Pt requires heavy HHA during gait without AD and standing tap ups without AD. Pt with good tolerance ot addition of step ups with rest breaks due to DOE    PT Next Visit Plan dynamic gait and balance training, hip strength.  progress HEP to tolerance.    PT Home Exercise Plan [email or web address]    Consulted and Agree with Plan of Care Patient             Patient will benefit from skilled therapeutic intervention in order to improve the following deficits and impairments:     Visit Diagnosis: Balance disorder  Muscle weakness (generalized)  Decreased activity tolerance     Problem List Patient Active Problem List   Diagnosis Date Noted   Fracture of L5 vertebra ([place]) 03/31/2020   Intractable pain 03/31/2020   Impaired ambulation 03/31/2020   Closed L5 vertebral fracture ([place]) 03/31/2020   Chronic kidney disease, stage 3 unspecified ([place]) 03/03/2020   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/2020   Essential hypertension 03/03/2020   Hardening of the aorta (main artery of the heart) ([place]) 03/03/2020   Iron deficiency anemia 03/03/2020   Multiple myeloma ([place]) 03/03/2020   Obesity 03/03/2020   Personal history of colonic polyps 03/03/2020   Personal history of pulmonary embolism 03/03/2020   Prediabetes 03/03/2020   Primary gout 03/03/2020   Pure hypercholesterolemia 03/03/2020   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[place]) 03/03/2020   Thoracic aortic aneurysm without rupture ([place]) 03/03/2020   Type 2 diabetes mellitus ([place]) 03/03/2020   Vitamin D deficiency 03/03/2020   Malignant tumor of kidney ([place]) 03/03/2020   Neoplasm of right kidney 10/03/2016   Renal lesion 05/06/2016   Delirium 05/05/2016   Pulmonary embolus ([place]) 05/05/2016   Status post total right knee replacement 05/03/2016   Osteoarthritis of right knee 04/20/2016   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

## 2020-09-03 ENCOUNTER — Other Ambulatory Visit: Payer: Self-pay | Admitting: *Deleted

## 2020-09-03 MED ORDER — MORPHINE SULFATE ER 30 MG PO TBCR: 30.0000 mg | EXTENDED_RELEASE_TABLET | Freq: Two times a day (BID) | ORAL | 0 refills | Status: DC

## 2020-09-03 NOTE — Telephone Encounter (Signed)
Ms. Hommes called. Requested refill of MS Contin for patient. Request routed to Dr. Alen Blew

## 2020-09-04 ENCOUNTER — Inpatient Hospital Stay: Payer: Medicare HMO

## 2020-09-04 ENCOUNTER — Other Ambulatory Visit: Payer: Self-pay

## 2020-09-04 VITALS — BP 150/90 | HR 72 | Temp 98.4°F | Resp 16 | Wt 175.0 lb

## 2020-09-04 DIAGNOSIS — Z888 Allergy status to other drugs, medicaments and biological substances status: Secondary | ICD-10-CM | POA: Diagnosis not present

## 2020-09-04 DIAGNOSIS — Z885 Allergy status to narcotic agent status: Secondary | ICD-10-CM | POA: Diagnosis not present

## 2020-09-04 DIAGNOSIS — C9000 Multiple myeloma not having achieved remission: Secondary | ICD-10-CM

## 2020-09-04 DIAGNOSIS — C641 Malignant neoplasm of right kidney, except renal pelvis: Secondary | ICD-10-CM | POA: Diagnosis not present

## 2020-09-04 DIAGNOSIS — Z79899 Other long term (current) drug therapy: Secondary | ICD-10-CM | POA: Diagnosis not present

## 2020-09-04 DIAGNOSIS — G8929 Other chronic pain: Secondary | ICD-10-CM | POA: Diagnosis not present

## 2020-09-04 DIAGNOSIS — Z5112 Encounter for antineoplastic immunotherapy: Secondary | ICD-10-CM | POA: Diagnosis not present

## 2020-09-04 DIAGNOSIS — Z905 Acquired absence of kidney: Secondary | ICD-10-CM | POA: Diagnosis not present

## 2020-09-04 LAB — CMP (CANCER CENTER ONLY)
ALT: 14 U/L (ref 0–44)
AST: 15 U/L (ref 15–41)
Albumin: 3.7 g/dL (ref 3.5–5.0)
Alkaline Phosphatase: 55 U/L (ref 38–126)
Anion gap: 9 (ref 5–15)
BUN: 18 mg/dL (ref 8–23)
CO2: 22 mmol/L (ref 22–32)
Calcium: 8.7 mg/dL — ABNORMAL LOW (ref 8.9–10.3)
Chloride: 103 mmol/L (ref 98–111)
Creatinine: 1.25 mg/dL — ABNORMAL HIGH (ref 0.61–1.24)
GFR, Estimated: 59 mL/min — ABNORMAL LOW (ref >60)
Glucose, Bld: 212 mg/dL — ABNORMAL HIGH (ref 70–99)
Potassium: 4.5 mmol/L (ref 3.5–5.1)
Sodium: 134 mmol/L — ABNORMAL LOW (ref 135–145)
Total Bilirubin: 0.7 mg/dL (ref 0.3–1.2)
Total Protein: 7.2 g/dL (ref 6.5–8.1)

## 2020-09-04 LAB — CBC WITH DIFFERENTIAL (CANCER CENTER ONLY)
Abs Immature Granulocytes: 0.05 10*3/uL (ref 0.00–0.07)
Basophils Absolute: 0 10*3/uL (ref 0.0–0.1)
Basophils Relative: 0 %
Eosinophils Absolute: 0 10*3/uL (ref 0.0–0.5)
Eosinophils Relative: 0 %
HCT: 36.2 % — ABNORMAL LOW (ref 39.0–52.0)
Hemoglobin: 12.4 g/dL — ABNORMAL LOW (ref 13.0–17.0)
Immature Granulocytes: 1 %
Lymphocytes Relative: 6 %
Lymphs Abs: 0.5 10*3/uL — ABNORMAL LOW (ref 0.7–4.0)
MCH: 31.1 pg (ref 26.0–34.0)
MCHC: 34.3 g/dL (ref 30.0–36.0)
MCV: 90.7 fL (ref 80.0–100.0)
Monocytes Absolute: 0.2 10*3/uL (ref 0.1–1.0)
Monocytes Relative: 2 %
Neutro Abs: 8.1 10*3/uL — ABNORMAL HIGH (ref 1.7–7.7)
Neutrophils Relative %: 91 %
Platelet Count: 142 10*3/uL — ABNORMAL LOW (ref 150–400)
RBC: 3.99 MIL/uL — ABNORMAL LOW (ref 4.22–5.81)
RDW: 14.2 % (ref 11.5–15.5)
WBC Count: 8.9 10*3/uL (ref 4.0–10.5)
nRBC: 0 % (ref 0.0–0.2)

## 2020-09-04 MED ORDER — PROCHLORPERAZINE MALEATE 10 MG PO TABS
ORAL_TABLET | ORAL | Status: AC
  Filled 2020-09-04: qty 1

## 2020-09-04 MED ORDER — SODIUM CHLORIDE 0.9 % IV SOLN
Freq: Once | INTRAVENOUS | Status: AC
  Filled 2020-09-04: qty 250

## 2020-09-04 MED ORDER — BORTEZOMIB CHEMO IV INJECTION 3.5 MG (1MG/ML-GENERIC)
1.0000 mg/m2 | Freq: Once | INTRAVENOUS | Status: AC
  Administered 2020-09-04: 2 mg via INTRAVENOUS
  Filled 2020-09-04: qty 2

## 2020-09-04 MED ORDER — PROCHLORPERAZINE MALEATE 10 MG PO TABS
10.0000 mg | ORAL_TABLET | Freq: Once | ORAL | Status: AC
  Administered 2020-09-04: 10 mg via ORAL

## 2020-09-04 NOTE — Patient Instructions (Signed)
[facility]   ?Discharge Instructions: ?Thank you for choosing [facility] to provide your oncology and hematology care.  ? ?If you have a lab appointment with the Cancer Center, please go directly to the Cancer Center and check in at the registration area. ?  ?Wear comfortable clothing and clothing appropriate for easy access to any Portacath or PICC line.  ? ?We strive to give you quality time with your provider. You may need to reschedule your appointment if you arrive late (15 or more minutes).  Arriving late affects you and other patients whose appointments are after yours.  Also, if you miss three or more appointments without notifying the office, you may be dismissed from the clinic at the provider?s discretion.    ?  ?For prescription refill requests, have your pharmacy contact our office and allow 72 hours for refills to be completed.   ? ?Today you received the following chemotherapy and/or immunotherapy agents: [email or web address]    ?  ?To help prevent nausea and vomiting after your treatment, we encourage you to take your nausea medication as directed. ? ?BELOW ARE SYMPTOMS THAT SHOULD BE REPORTED IMMEDIATELY: ?*FEVER GREATER THAN 100.4 F (38 ?C) OR HIGHER ?*CHILLS OR SWEATING ?*NAUSEA AND VOMITING THAT IS NOT CONTROLLED WITH YOUR NAUSEA MEDICATION ?*UNUSUAL SHORTNESS OF BREATH ?*UNUSUAL BRUISING OR BLEEDING ?*URINARY PROBLEMS (pain or burning when urinating, or frequent urination) ?*BOWEL PROBLEMS (unusual diarrhea, constipation, pain near the anus) ?TENDERNESS IN MOUTH AND THROAT WITH OR WITHOUT PRESENCE OF ULCERS (sore throat, sores in mouth, or a toothache) ?UNUSUAL RASH, SWELLING OR PAIN  ?UNUSUAL VAGINAL DISCHARGE OR ITCHING  ? ?Items with * indicate a potential emergency and should be followed up as soon as possible or go to the Emergency Department if any problems should occur. ? ?Please show the CHEMOTHERAPY ALERT CARD or IMMUNOTHERAPY ALERT CARD at check-in  to the Emergency Department and triage nurse. ? ?Should you have questions after your visit or need to cancel or reschedule your appointment, please contact [facility]  Dept: [phone]  and follow the prompts.  Office hours are 8:00 a.m. to 4:30 p.m. Monday - Friday. Please note that voicemails left after 4:00 p.m. may not be returned until the following business day.  We are closed weekends and major holidays. You have access to a nurse at all times for urgent questions. Please call the main number to the clinic Dept: [phone] and follow the prompts. ? ? ?For any non-urgent questions, you may also contact your provider using MyChart. We now offer e-Visits for anyone 18 and older to request care online for non-urgent symptoms. For details visit [email or web address]. ?  ?Also download the MyChart app! Go to the app store, search "MyChart", open the app, select [facility], and log in with your MyChart username and password. ? ?Due to Covid, a mask is required upon entering the hospital/clinic. If you do not have a mask, one will be given to you upon arrival. For doctor visits, patients may have 1 support person aged 18 or older with them. For treatment visits, patients cannot have anyone with them due to current Covid guidelines and our immunocompromised population.  ? ?

## 2020-09-07 ENCOUNTER — Ambulatory Visit (INDEPENDENT_AMBULATORY_CARE_PROVIDER_SITE_OTHER): Payer: Medicare HMO | Admitting: Physical Therapy

## 2020-09-07 ENCOUNTER — Other Ambulatory Visit: Payer: Self-pay

## 2020-09-07 ENCOUNTER — Encounter: Payer: Self-pay | Admitting: Physical Therapy

## 2020-09-07 DIAGNOSIS — M6281 Muscle weakness (generalized): Secondary | ICD-10-CM | POA: Diagnosis not present

## 2020-09-07 DIAGNOSIS — R2689 Other abnormalities of gait and mobility: Secondary | ICD-10-CM

## 2020-09-07 DIAGNOSIS — R6889 Other general symptoms and signs: Secondary | ICD-10-CM

## 2020-09-07 NOTE — Therapy (Signed)
[place], [place], [address] Phone: [phone]   Fax:  [phone]  Physical Therapy Treatment  Patient Details  Name: Devin [name] MRN: [id number] Date of Birth: 14-Oct-1940 Referring Provider (PT): Sela Hilding   Encounter Date: 09/07/2020   PT End of Session - 09/07/20 1152     Visit Number 4    Number of Visits 12    Date for PT Re-Evaluation 10/07/20    Authorization - Visit Number 4    Authorization - Number of Visits 12    Progress Note Due on Visit 10    PT Start Time 1103    PT Stop Time 1141    PT Time Calculation (min) 38 min    Activity Tolerance Patient tolerated treatment well    Behavior During Therapy [facility] for tasks assessed/performed             Past Medical History:  Diagnosis Date   Arthritis    History of pulmonary embolism 04/2016   Hypertension    Renal cell carcinoma [facility])     Past Surgical History:  Procedure Laterality Date   APPENDECTOMY     DENTAL SURGERY  03/2016   all upper teeth pulled and dentures.    HERNIA REPAIR  1985&2001   IR FLUORO GUIDED NEEDLE PLC ASPIRATION/INJECTION LOC  02/05/2020   right knee replacement   04/2016   right wrist plate due to fracture      ROBOT ASSISTED LAPAROSCOPIC NEPHRECTOMY Right 10/03/2016   Procedure: XI ROBOTIC ASSISTED LAPAROSCOPIC  PARTIAL NEPHRECTOMY;  Surgeon: Raynelle Bring, MD;  [facility];  Service: Urology;  Laterality: Right;    There were no vitals filed for this visit.   Subjective Assessment - 09/07/20 1106     Subjective "My biggest problem with using the cane is that after about 2 hrs it affects my knees".  Pain in hip comes and goes.    Pertinent History Multiple myeloma (active treatment), Lt hip ORIF, RA    Patient Stated Goals reduce pain and improve falls, walking without AD    Currently in Pain? Yes    Pain Score 5     Pain Location Hip    Pain Orientation Left    Pain  Descriptors / Indicators Sore    Pain Relieving Factors lying down, pain medicine                [facility] PT Assessment - 09/07/20 0001       Assessment   Medical Diagnosis repeated falls    Referring Provider (PT) Sela Hilding    Prior Therapy HHPT             [place] Adult PT Treatment/Exercise - 09/07/20 0001       Ambulation/Gait   Ambulation Distance (Feet) 90 Feet   2nd trial 100 ft   Assistive device Straight cane    Gait Pattern Step-through pattern;Decreased stance time - left;Decreased weight shift to left;Antalgic;Lateral trunk lean to right;Narrow base of support    Pre-Gait Activities instruction given for placement of cane with step through pattern (was too far away and every 4 steps instead of 2.).  Step forward/ rock back with single leg and cane placed for improved use of cane x 5 reps each leg.    Gait Comments cane in Rt hand first trial, Lt hand 2nd trial.      Knee/Hip Exercises: Stretches   Passive Hamstring Stretch Right;Left;2 reps;20 seconds  seated   Quad Stretch Right;Left;2 reps;20 seconds   seated with leg back (hip flexor stretch)   Piriformis Stretch Right;Left;2 reps;20 seconds   seated, both fig 4 push down and pulling knee towards opp shoulder.     Knee/Hip Exercises: Aerobic   Nustep L5: legs only x 5 min for warm up      Knee/Hip Exercises: Standing   Forward Step Up Right;Left;1 set x 5 reps with bilat UE support.    SLS SLS without UE support: up to 5 sec LLE, 2 sec RLE- 3 reps each leg.    Other Standing Knee Exercises tandem stance x 15 sec each leg without UE support.    Other Standing Knee Exercises Lateral step ups onto 2.5" foam with light unilateral support on rail x 5 reps each side.                         PT Long Term Goals - 09/07/20 1152       PT LONG TERM GOAL #1   Title Pt will be independent with HEP    Time 6    Period Weeks    Status On-going      PT LONG TERM GOAL #2   Title Pt will  improve Berg balance score to >= 42/56 to demo improved balance    Baseline 34    Time 6    Period Weeks    Status On-going      PT LONG TERM GOAL #3   Title Pt will improve bilat hip strength to 4+/5 to tolerate walking and standing with decreased pain    Time 6    Period Weeks    Status On-going                   Plan - 09/07/20 1150     Clinical Impression Statement Pt required short seated rest breaks in between gait trials due to LE fatigue.  His gait quality improved after switching cane into Lt hand and with cues for placement.  He demos decreased stance time with SLS on Rt vs Lt.  Introduced seated quad/hip flexor stretch with good tolerance.  Progressing towards goals.    PT Next Visit Plan dynamic gait and balance training, hip strength.  progress HEP to tolerance.    PT Home Exercise Plan [email or web address]    Consulted and Agree with Plan of Care Patient             Patient will benefit from skilled therapeutic intervention in order to improve the following deficits and impairments:  Pain, Impaired flexibility, Decreased strength, Decreased coordination, Decreased activity tolerance, Decreased balance, Difficulty walking  Visit Diagnosis: Balance disorder  Muscle weakness (generalized)  Decreased activity tolerance     Problem List Patient Active Problem List   Diagnosis Date Noted   Fracture of L5 vertebra ([place]) 03/31/2020   Intractable pain 03/31/2020   Impaired ambulation 03/31/2020   Closed L5 vertebral fracture ([place]) 03/31/2020   Chronic kidney disease, stage 3 unspecified ([place]) 03/03/2020   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/2020   Essential hypertension 03/03/2020   Hardening of the aorta (main artery of the heart) ([place]) 03/03/2020   Iron deficiency anemia 03/03/2020   Multiple myeloma ([place]) 03/03/2020   Obesity 03/03/2020   Personal history of colonic polyps 03/03/2020   Personal history of pulmonary embolism 03/03/2020    Prediabetes 03/03/2020   Primary gout 03/03/2020   Pure hypercholesterolemia 03/03/2020  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[place]) 03/03/2020   Thoracic aortic aneurysm without rupture ([place]) 03/03/2020   Type 2 diabetes mellitus ([place]) 03/03/2020   Vitamin D deficiency 03/03/2020   Malignant tumor of kidney ([place]) 03/03/2020   Neoplasm of right kidney 10/03/2016   Renal lesion 05/06/2016   Delirium 05/05/2016   Pulmonary embolus ([place]) 05/05/2016   Status post total right knee replacement 05/03/2016   Osteoarthritis of right knee 04/20/2016   Rheumatoid arthritis involving right knee ([place]) 04/20/2016   Inguinal hernia without mention of obstruction or gangrene, unilateral or unspecified, (not specified as recurrent)-left 08/10/2011   Kerin Perna, PTA 09/07/20 11:54 AM   [place], [place], [address] Phone: [phone]   Fax:  [phone]  Name: EUGEN JEANSONNE MRN: [id number] Date of Birth: 1940-12-03

## 2020-09-09 ENCOUNTER — Other Ambulatory Visit: Payer: Self-pay

## 2020-09-09 ENCOUNTER — Ambulatory Visit (INDEPENDENT_AMBULATORY_CARE_PROVIDER_SITE_OTHER): Payer: Medicare HMO | Admitting: Physical Therapy

## 2020-09-09 DIAGNOSIS — R2689 Other abnormalities of gait and mobility: Secondary | ICD-10-CM

## 2020-09-09 DIAGNOSIS — M6281 Muscle weakness (generalized): Secondary | ICD-10-CM

## 2020-09-09 DIAGNOSIS — R6889 Other general symptoms and signs: Secondary | ICD-10-CM

## 2020-09-09 NOTE — Patient Instructions (Signed)
Access Code: [email or web address] URL: [email or web address] Date: 09/09/2020 Prepared by: [facility]  Exercises Seated Hamstring Stretch - 1 x daily - 7 x weekly - 1 sets - 2 reps - 20-30 seconds hold Seated Hip Flexor Stretch - 1 x daily - 7 x weekly - 1 sets - 2 reps - 20 seconds hold Seated Figure 4 Piriformis Stretch - 1 x daily - 7 x weekly - 1 sets - 2 reps - 20-30 seconds hold Standing Single Leg Stance with Counter Support - 1 x daily - 7 x weekly - 3 sets - 1 reps - up to 30 seconds hold Forward Step Touch - 1 x daily - 7 x weekly - 3 sets - 10 reps Side Stepping with Counter Support - 1 x daily - 7 x weekly - 2 sets - 2 reps Standing Hip Extension with Counter Support - 1 x daily - 7 x weekly - 2 sets - 10 reps

## 2020-09-09 NOTE — Therapy (Signed)
[place], [place], [address] Phone: [phone]   Fax:  [phone]  Physical Therapy Treatment  Patient Details  Name: Devin [name] MRN: [id number] Date of Birth: [birth year] Referring Provider (PT): Sela Hilding   Encounter Date: 09/09/2020   PT End of Session - 09/09/20 1446     Visit Number 5    Number of Visits 12    Date for PT Re-Evaluation 10/07/20    Authorization - Visit Number 5    Authorization - Number of Visits 12    Progress Note Due on Visit 10    PT Start Time 9292    PT Stop Time 1515    PT Time Calculation (min) 42 min    Activity Tolerance Patient tolerated treatment well    Behavior During Therapy [facility] for tasks assessed/performed             Past Medical History:  Diagnosis Date   Arthritis    History of pulmonary embolism 04/2016   Hypertension    Renal cell carcinoma [facility])     Past Surgical History:  Procedure Laterality Date   APPENDECTOMY     DENTAL SURGERY  03/2016   all upper teeth pulled and dentures.    HERNIA REPAIR  1985&2001   IR FLUORO GUIDED NEEDLE PLC ASPIRATION/INJECTION LOC  02/05/2020   right knee replacement   04/2016   right wrist plate due to fracture      ROBOT ASSISTED LAPAROSCOPIC NEPHRECTOMY Right 10/03/2016   Procedure: XI ROBOTIC ASSISTED LAPAROSCOPIC  PARTIAL NEPHRECTOMY;  Surgeon: Raynelle Bring, MD;  [facility];  Service: Urology;  Laterality: Right;    There were no vitals filed for this visit.   Subjective Assessment - 09/09/20 1439     Subjective Pt reports the exercises make his legs tired.  He has been walking more with the cane, but it is more tiring.  He can notice that he has more endurance in his legs since starting therapy.  He has also been doing short trials of walking without AD down his hallway (30 ft)    Pertinent History Multiple myeloma (active treatment), Lt hip ORIF, RA    Patient Stated Goals  reduce pain and improve falls, walking without AD    Currently in Pain? No/denies    Pain Score --   took pain medicine prior to session.               [facility] PT Assessment - 09/09/20 0001       Assessment   Medical Diagnosis repeated falls    Referring Provider (PT) Sela Hilding    Prior Therapy HHPT              [place] Adult PT Treatment/Exercise - 09/09/20 0001       Knee/Hip Exercises: Stretches   Passive Hamstring Stretch Right;Left;2 reps;20 seconds   seated   Quad Stretch Right;Left;2 reps;20 seconds   seated with leg back (hip flexor stretch)     Knee/Hip Exercises: Aerobic   Nustep L5: legs only x 5 min for warm up      Knee/Hip Exercises: Standing   Hip Extension Stengthening;Right;Left;1 set;10 reps    Lateral Step Up Right;Left;1 set;5 reps;Hand Hold: 2;Step Height: 6"    Forward Step Up Right;Left;1 set;5 reps;Hand Hold: 2;Step Height: 6"    SLS SLS with toe taps front / side/back x 5 reps each leg, with light UE support on counter.  Other Standing Knee Exercises side stepping at counter x 8 ft Rt/Lt with light UE support. x 2 sets.  backward walking with light UE on counter x 8 ft x 4 reps                    PT Education - 09/09/20 1522     Education Details updated HEP    Person(s) Educated Patient    Methods Explanation    Comprehension Verbalized understanding                 PT Long Term Goals - 09/07/20 1152       PT LONG TERM GOAL #1   Title Pt will be independent with HEP    Time 6    Period Weeks    Status On-going      PT LONG TERM GOAL #2   Title Pt will improve Berg balance score to >= 42/56 to demo improved balance    Baseline 34    Time 6    Period Weeks    Status On-going      PT LONG TERM GOAL #3   Title Pt will improve bilat hip strength to 4+/5 to tolerate walking and standing with decreased pain    Time 6    Period Weeks    Status On-going                   Plan - 09/09/20 1520      Clinical Impression Statement Pt continues to require short seated rest breaks in between sets of standing LE exercises.  Updated HEP today; exercises well tolerated.  Pt reporting improvement in stamina of standing and has been working on walking with cane at home.  Progressing towards goals.    PT Next Visit Plan dynamic gait and balance training, hip strength.  progress HEP to tolerance.    PT Home Exercise Plan [email or web address]    Consulted and Agree with Plan of Care Patient             Patient will benefit from skilled therapeutic intervention in order to improve the following deficits and impairments:  Pain, Impaired flexibility, Decreased strength, Decreased coordination, Decreased activity tolerance, Decreased balance, Difficulty walking  Visit Diagnosis: Muscle weakness (generalized)  Balance disorder  Decreased activity tolerance     Problem List Patient Active Problem List   Diagnosis Date Noted   Fracture of L5 vertebra ([place]) 03/31/2020   Intractable pain 03/31/2020   Impaired ambulation 03/31/2020   Closed L5 vertebral fracture ([place]) 03/31/2020   Chronic kidney disease, stage 3 unspecified ([place]) 03/03/2020   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/2020   Essential hypertension 03/03/2020   Hardening of the aorta (main artery of the heart) ([place]) 03/03/2020   Iron deficiency anemia 03/03/2020   Multiple myeloma ([place]) 03/03/2020   Obesity 03/03/2020   Personal history of colonic polyps 03/03/2020   Personal history of pulmonary embolism 03/03/2020   Prediabetes 03/03/2020   Primary gout 03/03/2020   Pure hypercholesterolemia 03/03/2020   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[place]) 03/03/2020   Thoracic aortic aneurysm without rupture ([place]) 03/03/2020   Type 2 diabetes mellitus ([place]) 03/03/2020   Vitamin D deficiency 03/03/2020   Malignant tumor of kidney ([place]) 03/03/2020   Neoplasm of right kidney 10/03/2016   Renal lesion 05/06/2016   Delirium 05/05/2016    Pulmonary embolus ([place]) 05/05/2016   Status post total right knee replacement 05/03/2016   Osteoarthritis of right knee 04/20/2016  Rheumatoid arthritis involving right knee ([place]) 04/20/2016   Inguinal hernia without mention of obstruction or gangrene, unilateral or unspecified, (not specified as recurrent)-left 08/10/2011   Kerin Perna, PTA 09/09/20 3:34 PM   [place], [place], [address] Phone: [phone]   Fax:  [phone]  Name: Devin [name] MRN: [id number] Date of Birth: 1941/01/27

## 2020-09-11 ENCOUNTER — Inpatient Hospital Stay: Payer: Medicare HMO

## 2020-09-11 ENCOUNTER — Other Ambulatory Visit: Payer: Self-pay

## 2020-09-11 ENCOUNTER — Other Ambulatory Visit: Payer: Self-pay | Admitting: *Deleted

## 2020-09-11 ENCOUNTER — Other Ambulatory Visit (HOSPITAL_COMMUNITY): Payer: Self-pay

## 2020-09-11 ENCOUNTER — Telehealth: Payer: Self-pay

## 2020-09-11 ENCOUNTER — Telehealth: Payer: Self-pay | Admitting: Pharmacist

## 2020-09-11 ENCOUNTER — Other Ambulatory Visit: Payer: Self-pay | Admitting: Oncology

## 2020-09-11 ENCOUNTER — Encounter: Payer: Self-pay | Admitting: Oncology

## 2020-09-11 ENCOUNTER — Inpatient Hospital Stay (HOSPITAL_BASED_OUTPATIENT_CLINIC_OR_DEPARTMENT_OTHER): Payer: Medicare HMO | Admitting: Oncology

## 2020-09-11 VITALS — BP 167/91 | HR 83 | Resp 17 | Wt 176.0 lb

## 2020-09-11 DIAGNOSIS — C9000 Multiple myeloma not having achieved remission: Secondary | ICD-10-CM | POA: Diagnosis not present

## 2020-09-11 DIAGNOSIS — G8929 Other chronic pain: Secondary | ICD-10-CM | POA: Diagnosis not present

## 2020-09-11 DIAGNOSIS — Z885 Allergy status to narcotic agent status: Secondary | ICD-10-CM | POA: Diagnosis not present

## 2020-09-11 DIAGNOSIS — Z888 Allergy status to other drugs, medicaments and biological substances status: Secondary | ICD-10-CM | POA: Diagnosis not present

## 2020-09-11 DIAGNOSIS — Z905 Acquired absence of kidney: Secondary | ICD-10-CM | POA: Diagnosis not present

## 2020-09-11 DIAGNOSIS — Z79899 Other long term (current) drug therapy: Secondary | ICD-10-CM | POA: Diagnosis not present

## 2020-09-11 DIAGNOSIS — Z5112 Encounter for antineoplastic immunotherapy: Secondary | ICD-10-CM | POA: Diagnosis not present

## 2020-09-11 DIAGNOSIS — C641 Malignant neoplasm of right kidney, except renal pelvis: Secondary | ICD-10-CM | POA: Diagnosis not present

## 2020-09-11 LAB — CMP (CANCER CENTER ONLY)
ALT: 8 U/L (ref 0–44)
AST: 11 U/L — ABNORMAL LOW (ref 15–41)
Albumin: 3.5 g/dL (ref 3.5–5.0)
Alkaline Phosphatase: 55 U/L (ref 38–126)
Anion gap: 13 (ref 5–15)
BUN: 20 mg/dL (ref 8–23)
CO2: 20 mmol/L — ABNORMAL LOW (ref 22–32)
Calcium: 8.9 mg/dL (ref 8.9–10.3)
Chloride: 101 mmol/L (ref 98–111)
Creatinine: 1.24 mg/dL (ref 0.61–1.24)
GFR, Estimated: 59 mL/min — ABNORMAL LOW (ref >60)
Glucose, Bld: 164 mg/dL — ABNORMAL HIGH (ref 70–99)
Potassium: 4.4 mmol/L (ref 3.5–5.1)
Sodium: 134 mmol/L — ABNORMAL LOW (ref 135–145)
Total Bilirubin: 0.8 mg/dL (ref 0.3–1.2)
Total Protein: 7.5 g/dL (ref 6.5–8.1)

## 2020-09-11 LAB — CBC WITH DIFFERENTIAL (CANCER CENTER ONLY)
Abs Immature Granulocytes: 0.06 10*3/uL (ref 0.00–0.07)
Basophils Absolute: 0 10*3/uL (ref 0.0–0.1)
Basophils Relative: 0 %
Eosinophils Absolute: 0 10*3/uL (ref 0.0–0.5)
Eosinophils Relative: 1 %
HCT: 36.1 % — ABNORMAL LOW (ref 39.0–52.0)
Hemoglobin: 12.7 g/dL — ABNORMAL LOW (ref 13.0–17.0)
Immature Granulocytes: 1 %
Lymphocytes Relative: 7 %
Lymphs Abs: 0.6 10*3/uL — ABNORMAL LOW (ref 0.7–4.0)
MCH: 31.6 pg (ref 26.0–34.0)
MCHC: 35.2 g/dL (ref 30.0–36.0)
MCV: 89.8 fL (ref 80.0–100.0)
Monocytes Absolute: 0.4 10*3/uL (ref 0.1–1.0)
Monocytes Relative: 5 %
Neutro Abs: 7.7 10*3/uL (ref 1.7–7.7)
Neutrophils Relative %: 86 %
Platelet Count: 140 10*3/uL — ABNORMAL LOW (ref 150–400)
RBC: 4.02 MIL/uL — ABNORMAL LOW (ref 4.22–5.81)
RDW: 14.6 % (ref 11.5–15.5)
WBC Count: 8.8 10*3/uL (ref 4.0–10.5)
nRBC: 0 % (ref 0.0–0.2)

## 2020-09-11 MED ORDER — BORTEZOMIB CHEMO IV INJECTION 3.5 MG (1MG/ML-GENERIC)
1.0000 mg/m2 | Freq: Once | INTRAVENOUS | Status: AC
  Administered 2020-09-11: 2 mg via INTRAVENOUS
  Filled 2020-09-11: qty 2
  Filled 2020-09-11: qty 0

## 2020-09-11 MED ORDER — LENALIDOMIDE 25 MG PO CAPS
25.0000 mg | ORAL_CAPSULE | Freq: Every day | ORAL | 0 refills | Status: DC
  Filled 2020-09-11: qty 21, 21d supply, fill #0

## 2020-09-11 MED ORDER — LENALIDOMIDE 10 MG PO CAPS
10.0000 mg | ORAL_CAPSULE | Freq: Every day | ORAL | 0 refills | Status: DC
  Filled 2020-09-11: qty 21, 21d supply, fill #0

## 2020-09-11 MED ORDER — PROCHLORPERAZINE MALEATE 10 MG PO TABS
10.0000 mg | ORAL_TABLET | Freq: Once | ORAL | Status: AC
Start: 2020-09-11 — End: 2020-09-11
  Administered 2020-09-11: 10 mg via ORAL

## 2020-09-11 MED ORDER — PROCHLORPERAZINE MALEATE 10 MG PO TABS
ORAL_TABLET | ORAL | Status: AC
  Filled 2020-09-11: qty 1

## 2020-09-11 MED ORDER — SODIUM CHLORIDE 0.9 % IV SOLN
Freq: Once | INTRAVENOUS | Status: AC
  Filled 2020-09-11: qty 250

## 2020-09-11 NOTE — Telephone Encounter (Addendum)
Oral Oncology Pharmacist Encounter  Received new prescription for Revlimid (lenalidomide) for the treatment of multiple myeloma in conjunction with dexamethasone, planned duration until disease progression or unacceptable drug toxicity.  Prescription dose and frequency assessed for appropriateness. Dose reduced given patient's CrCl is < 60 mL/min  CBC w/ Diff and CMP from 09/11/20 assessed, noted Scr 1.24 mg/dL (CrCl ~54 mL/min) dose of Revlimid has been reduced for this.  Current medication list in Epic reviewed, no relevant/significant DDIs with Revlimid identified.  Evaluated chart and no patient barriers to medication adherence noted.   Patient agreement for treatment documented in MD note on 09/11/20.  Once [employer] auth # has been updated with new dose change, prescription will need to be sent to [facility] (now [facility] for dispensing given Revlimid is a limited distribution medication.   Oral Oncology Clinic will continue to follow for insurance authorization, copayment issues, initial counseling and start date.  Leron Croak, PharmD, BCPS Hematology/Oncology Clinical Pharmacist [facility] [phone] 09/11/2020 4:34 PM

## 2020-09-11 NOTE — Patient Instructions (Signed)
[facility] MEDICAL ONCOLOGY   ?Discharge Instructions: ?Thank you for choosing [facility] to provide your oncology and hematology care.  ? ?If you have a lab appointment with the Cancer Center, please go directly to the Cancer Center and check in at the registration area. ?  ?Wear comfortable clothing and clothing appropriate for easy access to any Portacath or PICC line.  ? ?We strive to give you quality time with your provider. You may need to reschedule your appointment if you arrive late (15 or more minutes).  Arriving late affects you and other patients whose appointments are after yours.  Also, if you miss three or more appointments without notifying the office, you may be dismissed from the clinic at the provider?s discretion.    ?  ?For prescription refill requests, have your pharmacy contact our office and allow 72 hours for refills to be completed.   ? ?Today you received the following chemotherapy and/or immunotherapy agents: bortezomib    ?  ?To help prevent nausea and vomiting after your treatment, we encourage you to take your nausea medication as directed. ? ?BELOW ARE SYMPTOMS THAT SHOULD BE REPORTED IMMEDIATELY: ?*FEVER GREATER THAN 100.4 F (38 ?C) OR HIGHER ?*CHILLS OR SWEATING ?*NAUSEA AND VOMITING THAT IS NOT CONTROLLED WITH YOUR NAUSEA MEDICATION ?*UNUSUAL SHORTNESS OF BREATH ?*UNUSUAL BRUISING OR BLEEDING ?*URINARY PROBLEMS (pain or burning when urinating, or frequent urination) ?*BOWEL PROBLEMS (unusual diarrhea, constipation, pain near the anus) ?TENDERNESS IN MOUTH AND THROAT WITH OR WITHOUT PRESENCE OF ULCERS (sore throat, sores in mouth, or a toothache) ?UNUSUAL RASH, SWELLING OR PAIN  ?UNUSUAL VAGINAL DISCHARGE OR ITCHING  ? ?Items with * indicate a potential emergency and should be followed up as soon as possible or go to the Emergency Department if any problems should occur. ? ?Please show the CHEMOTHERAPY ALERT CARD or IMMUNOTHERAPY ALERT CARD at check-in  to the Emergency Department and triage nurse. ? ?Should you have questions after your visit or need to cancel or reschedule your appointment, please contact [facility]  Dept: [phone]  and follow the prompts.  Office hours are 8:00 a.m. to 4:30 p.m. Monday - Friday. Please note that voicemails left after 4:00 p.m. may not be returned until the following business day.  We are closed weekends and major holidays. You have access to a nurse at all times for urgent questions. Please call the main number to the clinic Dept: [phone] and follow the prompts. ? ? ?For any non-urgent questions, you may also contact your provider using MyChart. We now offer e-Visits for anyone 18 and older to request care online for non-urgent symptoms. For details visit [email or web address]. ?  ?Also download the MyChart app! Go to the app store, search "MyChart", open the app, select [facility], and log in with your MyChart username and password. ? ?Due to Covid, a mask is required upon entering the hospital/clinic. If you do not have a mask, one will be given to you upon arrival. For doctor visits, patients may have 1 support person aged 18 or older with them. For treatment visits, patients cannot have anyone with them due to current Covid guidelines and our immunocompromised population.  ? ?

## 2020-09-11 NOTE — Telephone Encounter (Signed)
Oral Oncology Patient Advocate Encounter   Received notification from [facility] that prior authorization for Revlimid is required.   PA submitted on CoverMyMeds Key [id number] Status is pending   Oral Oncology Clinic will continue to follow.  [place] [facility] [place] Phone [phone] Fax [phone] 09/11/2020 3:03 PM

## 2020-09-11 NOTE — Progress Notes (Signed)
Hematology and Oncology Follow Up Visit  Devin [name] [id number] [birth year] 80 y.o. 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 deletion, duplication 1 q, (97;98) in December 2021.   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   Prior Therapy:   He status post right robotic laparoscopic partial nephrectomy on October 03, 2016.  Palliative radiation therapy to the spine.  He completed 20 Gy in 10 fractions on March 24, 2020.  Current therapy:   Velcade and dexamethasone started on January 7 to be given weekly.  He is here for the next cycle of treatment.   Interim History: Devin [name] returns today for repeat evaluation.  Since the last visit, he reports no major changes in his health.  He had denies any worsening bone pain or pathological fractures.  He denies any recent falls or syncope.  His performance status quality of life continues to improve slowly.  He is ambulating with the help of walker and a cane inside of his house.  His performance status and quality of life remained reasonable.  He denies any nausea, vomiting or worsening neuropathy.    Medications: Unchanged on review. Current Outpatient Medications  Medication Sig Dispense Refill   acetaminophen (TYLENOL) 500 MG tablet Take 1,000 mg by mouth daily as needed for mild pain or moderate pain.     acyclovir (ZOVIRAX) 400 MG tablet Take 1 tablet (400 mg total) by mouth daily. 90 tablet 3   allopurinol (ZYLOPRIM) 300 MG tablet Take 300 mg by mouth daily.      amLODipine (NORVASC) 10 MG tablet Take 10 mg by mouth daily.     ASPIRIN LOW DOSE 81 MG EC tablet Take 81 mg by mouth daily.     Calcium Carb-Cholecalciferol (OYSTER SHELL CALCIUM W/D) 500-200 MG-UNIT  TABS TAKE 1 TABLET BY MOUTH TWICE A DAY 180 tablet 1   calcium-vitamin D (OSCAL WITH D) 500-200 MG-UNIT tablet Take 1 tablet by mouth 2 (two) times daily. 60 tablet 3   dexamethasone (DECADRON) 4 MG tablet Take 5 tablets weekly on the day of cancer treatment. 60 tablet 3   folic acid (FOLVITE) 1 MG tablet Take 1 mg by mouth daily.     furosemide (LASIX) 20 MG tablet Take 20 mg by mouth 2 (two) times daily. (Patient not taking: Reported on 08/26/2020)     labetalol (NORMODYNE) 300 MG tablet Take 300 mg by mouth 2 (two) times daily. (Patient not taking: Reported on 08/26/2020)     lidocaine (LIDODERM) 5 % Place 1 patch onto the skin every 12 (twelve) hours. Remove & Discard patch within 12 hours or as directed by MD (Patient not taking: Reported on 08/26/2020) 20 patch 0   losartan (COZAAR) 100 MG tablet Take 100 mg by mouth daily. (Patient not taking: Reported on 08/26/2020)     morphine (MS CONTIN) 30 MG 12 hr tablet Take 1 tablet (30 mg total) by mouth every 12 (twelve) hours. 60 tablet 0   morphine (MSIR) 15 MG tablet Take 1 tablet (15 mg total) by mouth every 4 (four) hours as needed for severe pain. 60 tablet 0   NARCAN 4 MG/0.1ML LIQD nasal spray kit Place 1 spray into the nose once. (Patient not taking: Reported on 08/26/2020)  polyethylene glycol (MIRALAX / GLYCOLAX) 17 g packet Take 17 g by mouth daily as needed for moderate constipation. 14 each 0   rosuvastatin (CRESTOR) 5 MG tablet Take 5 mg by mouth daily.     senna-docusate (SENOKOT-S) 8.6-50 MG tablet Take 1 tablet by mouth at bedtime. 100 tablet 0   Vitamin D, Ergocalciferol, (DRISDOL) 1.25 MG (50000 UNIT) CAPS capsule Take 50,000 Units by mouth every 14 (fourteen) days.     No current facility-administered medications for this visit.     Allergies:  Allergies  Allergen Reactions   Amlodipine Swelling   Pravastatin Other (See Comments)    Joint pain   Zestril [Lisinopril] Other (See Comments)    Gout   ( Zestril) gout   Other  Other (See Comments)   Oxycodone Other (See Comments)    Causes agitation       Physical Exam:    Blood pressure (!) 167/91, pulse 83, resp. rate 17, weight 176 lb (79.8 kg), SpO2 98 %.    ECOG:  1     General appearance: Alert, awake without any distress. Head: Atraumatic without abnormalities Oropharynx: Without any thrush or ulcers. Eyes: No scleral icterus. Lymph nodes: No lymphadenopathy noted in the cervical, supraclavicular, or axillary nodes Heart:regular rate and rhythm, without any murmurs or gallops.   Lung: Clear to auscultation without any rhonchi, wheezes or dullness to percussion. Abdomin: Soft, nontender without any shifting dullness or ascites. Musculoskeletal: No clubbing or cyanosis. Neurological: No motor or sensory deficits. Skin: No rashes or lesions.         Lab Results: Lab Results  Component Value Date   WBC 8.9 09/04/2020   HGB 12.4 (L) 09/04/2020   HCT 36.2 (L) 09/04/2020   MCV 90.7 09/04/2020   PLT 142 (L) 09/04/2020     Chemistry      Component Value Date/Time   NA 134 (L) 09/04/2020 1116   K 4.5 09/04/2020 1116   CL 103 09/04/2020 1116   CO2 22 09/04/2020 1116   BUN 18 09/04/2020 1116   CREATININE 1.25 (H) 09/04/2020 1116      Component Value Date/Time   CALCIUM 8.7 (L) 09/04/2020 1116   ALKPHOS 55 09/04/2020 1116   AST 15 09/04/2020 1116   ALT 14 09/04/2020 1116   BILITOT 0.7 09/04/2020 1116      Results for [name], [name] (MRN [id number]) as of 09/11/2020 12:30  Ref. Range 06/26/2020 13:22 07/31/2020 13:31  M Protein SerPl Elph-Mcnc Latest Ref Range: Not Observed g/dL 0.9 (H) 1.0 (H)  IFE 1 Unknown Comment (A) Comment (A)  Globulin, Total Latest Ref Range: 2.2 - 3.9 g/dL 3.2 3.7  B-Globulin SerPl Elph-Mcnc Latest Ref Range: 0.7 - 1.3 g/dL 0.8 1.0  IgG (Immunoglobin G), Serum Latest Ref Range: 603 - 1,613 mg/dL 380 (L) 424 (L)  IgM (Immunoglobulin M), Srm Latest Ref Range: 15 - 143 mg/dL 9 (L) 10 (L)  IgA Latest Ref  Range: 61 - 437 mg/dL 1,306 (H) 1,287 (H)    Results for [name], [name] (MRN [id number]) as of 09/11/2020 12:30  Ref. Range 06/26/2020 13:22 07/31/2020 13:31  Kappa free light chain Latest Ref Range: 3.3 - 19.4 mg/L 9.2 9.2  Lamda free light chains Latest Ref Range: 5.7 - 26.3 mg/L 271.4 (H) 260.7 (H)  Kappa, lamda light chain ratio Latest Ref Range: 0.26 - 1.65  0.03 (L) 0.04 (L)     Impression and Plan:  80 year old with:  1.  IgA lambda multiple myeloma  diagnosed in November 2021.    His disease status was updated at this time and treatment choices were discussed.  He continues to have a reasonable response to therapy with decline in his M spike as well as serum light chains with plateau has M spike around 1 g/dL.  Risks and benefits of continuing Velcade therapy alone versus switching to Revlimid based approach versus combination with Velcade and Revlimid were discussed.  After discussion today, I opted to switch him to Revlimid to be taken orally at 25 mg daily for 21 days with weekly dexamethasone.  Complication associated with this therapy include nausea, fatigue, rash and cytopenias.  After discussion he is agreeable to proceed.  2.  Compression fracture of the spine: No recent exacerbation noted at this time.  3.  Antiemetics: No nausea or vomiting reported at this time.  5.  VZV prophylaxis: He will continue on acyclovir at this time.   6.  Bone directed therapy: This will be repeated in August 2022.  Complication occluding osteonecrosis of the jaw and hypocalcemia were reviewed.  7.  Chronic pain: Manageable with current morphine regimen.   8.  Follow-up: In 4 weeks for repeat follow-up.   30  minutes were spent on this visit.  The time was dedicated to reviewing laboratory data, disease status update and outlining future plan of care.     Zola Button, MD 6/24/202212:29 PM

## 2020-09-12 ENCOUNTER — Other Ambulatory Visit (HOSPITAL_COMMUNITY): Payer: Self-pay

## 2020-09-14 ENCOUNTER — Other Ambulatory Visit: Payer: Self-pay

## 2020-09-14 ENCOUNTER — Ambulatory Visit (INDEPENDENT_AMBULATORY_CARE_PROVIDER_SITE_OTHER): Payer: Medicare HMO | Admitting: Physical Therapy

## 2020-09-14 ENCOUNTER — Other Ambulatory Visit: Payer: Self-pay | Admitting: *Deleted

## 2020-09-14 DIAGNOSIS — R2689 Other abnormalities of gait and mobility: Secondary | ICD-10-CM | POA: Diagnosis not present

## 2020-09-14 DIAGNOSIS — M6281 Muscle weakness (generalized): Secondary | ICD-10-CM | POA: Diagnosis not present

## 2020-09-14 DIAGNOSIS — R6889 Other general symptoms and signs: Secondary | ICD-10-CM

## 2020-09-14 MED ORDER — LENALIDOMIDE 10 MG PO CAPS: 10.0000 mg | ORAL_CAPSULE | Freq: Every day | ORAL | 0 refills | Status: DC

## 2020-09-14 MED FILL — Bortezomib For Inj 3.5 MG: INTRAMUSCULAR | Qty: 2 | Status: AC

## 2020-09-14 MED FILL — Bortezomib For Inj 3.5 MG: INTRAMUSCULAR | Qty: 1.5 | Status: AC

## 2020-09-14 NOTE — Therapy (Signed)
[facility] [address] [place], [place], [address] Phone: [phone]   Fax:  [phone]  Physical Therapy Treatment  Patient Details  Name: Devin [name] MRN: [id number] Date of Birth: [birth year] Referring Provider (PT): Sela Hilding   Encounter Date: 09/14/2020   PT End of Session - 09/14/20 1104     Visit Number 6    Number of Visits 12    Date for PT Re-Evaluation 10/07/20    Authorization - Visit Number 6    Authorization - Number of Visits 12    Progress Note Due on Visit 10    PT Start Time 1056    PT Stop Time 1143    PT Time Calculation (min) 47 min    Activity Tolerance Patient tolerated treatment well    Behavior During Therapy [facility] for tasks assessed/performed             Past Medical History:  Diagnosis Date   Arthritis    History of pulmonary embolism 04/2016   Hypertension    Renal cell carcinoma [facility] [address])     Past Surgical History:  Procedure Laterality Date   APPENDECTOMY     DENTAL SURGERY  03/2016   all upper teeth pulled and dentures.    HERNIA REPAIR  1985&2001   IR FLUORO GUIDED NEEDLE PLC ASPIRATION/INJECTION LOC  02/05/2020   right knee replacement   04/2016   right wrist plate due to fracture      ROBOT ASSISTED LAPAROSCOPIC NEPHRECTOMY Right 10/03/2016   Procedure: XI ROBOTIC ASSISTED LAPAROSCOPIC  PARTIAL NEPHRECTOMY;  Surgeon: Raynelle Bring, MD;  Location: [facility];  Service: Urology;  Laterality: Right;    There were no vitals filed for this visit.   Subjective Assessment - 09/14/20 1101     Subjective Pt reports he doesn't wake with pain, but once he gets active his pain elevates to 7/10 (in hips and knees). took pain medicine 1 hr ago and pain is resolved. He held off on seated hip flexor stretch because it increased his hip pain.  He's been walking around house with/without cane -but then his knees start to hurt.    Pertinent History Multiple myeloma (active  treatment), Lt hip ORIF, RA    Patient Stated Goals reduce pain and improve falls, walking without AD    Currently in Pain? No/denies    Pain Score 0-No pain                [facility] PT Assessment - 09/14/20 0001       Assessment   Medical Diagnosis repeated falls    Referring Provider (PT) Sela Hilding    Prior Therapy HHPT      Berg Balance Test   Sit to Stand Able to stand  independently using hands    Standing Unsupported Able to stand 2 minutes with supervision    Sitting with Back Unsupported but Feet Supported on Floor or Stool Able to sit safely and securely 2 minutes    Stand to Sit Sits safely with minimal use of hands    Transfers Able to transfer safely, definite need of hands    Standing Unsupported with Eyes Closed Able to stand 10 seconds with supervision    Standing Unsupported with Feet Together Able to place feet together independently and stand for 1 minute with supervision    From Standing, Reach Forward with Outstretched Arm Can reach forward >12 cm safely (5")    From Standing Position, Pick up  Object from Floor Able to pick up shoe, needs supervision    From Standing Position, Turn to Look Behind Over each Shoulder Turn sideways only but maintains balance    Turn 360 Degrees Able to turn 360 degrees safely but slowly   L- 6 sec, R-7 sec   Standing Unsupported, Alternately Place Feet on Step/Stool Able to stand independently and complete 8 steps >20 seconds    Standing Unsupported, One Foot in Front Able to take small step independently and hold 30 seconds    Standing on One Leg Able to lift leg independently and hold equal to or more than 3 seconds    Total Score 40    Berg comment: short seated rest breaks after 2-3 activities                           [facility] Adult PT Treatment/Exercise - 09/14/20 0001       Ambulation/Gait   Ambulation Distance (Feet) 90 Feet    Assistive device Straight cane    Gait Pattern Step-through  pattern;Decreased step length - right      Knee/Hip Exercises: Stretches   Passive Hamstring Stretch Right;Left;2 reps;20 seconds   seated   Quad Stretch Right;Left;2 reps;20 seconds   seated with leg back (hip flexor stretch)   Piriformis Stretch Right;Left;2 reps;20 seconds   seated, both fig 4 push down and pulling knee towards opp shoulder.     Knee/Hip Exercises: Aerobic   Nustep L5: legs only x 5 min for warm up      Knee/Hip Exercises: Standing   Hip Extension Stengthening;Right;Left;1 set;5 reps    Lateral Step Up Right;Left;1 set;5 reps;Hand Hold: 2;Step Height: 6"    SLS SLS with toe taps front / side/back x 5 reps each leg, with light UE support on counter.    Other Standing Knee Exercises side stepping at counter x 10 ft R/L x 2 reps with light UE support                         PT Long Term Goals - 09/14/20 1139       PT LONG TERM GOAL #1   Title Pt will be independent with HEP    Time 6    Period Weeks    Status On-going      PT LONG TERM GOAL #2   Title Pt will improve Berg balance score to >= 42/56 to demo improved balance    Baseline 40-6/27/22    Time 6    Period Weeks    Status Partially Met      PT LONG TERM GOAL #3   Title Pt will improve bilat hip strength to 4+/5 to tolerate walking and standing with decreased pain    Time 6    Period Weeks    Status On-going                   Plan - 09/14/20 1107     Clinical Impression Statement Standing hip ext exercise in HEP likely irritated bilat post hips, not seated hip flexor stretch. Reviewed technique and he was performing hamstring curls instead of hip ext; cues given to correct form with good tolerance to exercise.  Berg score improved to 40 points.  Pt requires short seated rest breaks after 2-3 standing exercise due to fatigue.  Will monitor exercise tolerance over next few visits, as pt is switching to oral  form of chemo med this week.    Rehab Potential Good    PT Frequency  2x / week    PT Duration 6 weeks    PT Treatment/Interventions Manual techniques;Therapeutic activities;Patient/family education;Balance training;Neuromuscular re-education;Therapeutic exercise;Cryotherapy;Stair training;Gait training;DME Instruction;Taping;Passive range of motion    PT Next Visit Plan dynamic gait and balance training, hip strength.  progress HEP to tolerance.    PT Home Exercise Plan [email or web address]    Consulted and Agree with Plan of Care Patient             Patient will benefit from skilled therapeutic intervention in order to improve the following deficits and impairments:  Pain, Impaired flexibility, Decreased strength, Decreased coordination, Decreased activity tolerance, Decreased balance, Difficulty walking  Visit Diagnosis: Muscle weakness (generalized)  Balance disorder  Decreased activity tolerance     Problem List Patient Active Problem List   Diagnosis Date Noted   Fracture of L5 vertebra ([place]) 03/31/2020   Intractable pain 03/31/2020   Impaired ambulation 03/31/2020   Closed L5 vertebral fracture ([place]) 03/31/2020   Chronic kidney disease, stage 3 unspecified ([place]) 03/03/2020   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/2020   Essential hypertension 03/03/2020   Hardening of the aorta (main artery of the heart) ([place]) 03/03/2020   Iron deficiency anemia 03/03/2020   Multiple myeloma ([place]) 03/03/2020   Obesity 03/03/2020   Personal history of colonic polyps 03/03/2020   Personal history of pulmonary embolism 03/03/2020   Prediabetes 03/03/2020   Primary gout 03/03/2020   Pure hypercholesterolemia 03/03/2020   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[place]) 03/03/2020   Thoracic aortic aneurysm without rupture ([place]) 03/03/2020   Type 2 diabetes mellitus ([place]) 03/03/2020   Vitamin D deficiency 03/03/2020   Malignant tumor of kidney ([place]) 03/03/2020   Neoplasm of right kidney 10/03/2016   Renal lesion 05/06/2016   Delirium 05/05/2016   Pulmonary embolus ([place])  05/05/2016   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 [place], [place], [address] Phone: [phone]   Fax:  [phone]  Name: Devin [name] MRN: [id number] Date of Birth: Jul 26, 1940

## 2020-09-16 ENCOUNTER — Other Ambulatory Visit: Payer: Self-pay

## 2020-09-16 ENCOUNTER — Ambulatory Visit (INDEPENDENT_AMBULATORY_CARE_PROVIDER_SITE_OTHER): Payer: Medicare HMO | Admitting: Physical Therapy

## 2020-09-16 DIAGNOSIS — R6889 Other general symptoms and signs: Secondary | ICD-10-CM | POA: Diagnosis not present

## 2020-09-16 DIAGNOSIS — R2689 Other abnormalities of gait and mobility: Secondary | ICD-10-CM

## 2020-09-16 DIAGNOSIS — M6281 Muscle weakness (generalized): Secondary | ICD-10-CM | POA: Diagnosis not present

## 2020-09-16 NOTE — Patient Instructions (Signed)
Access Code: [email or web address] URL: [email or web address] Date: 09/16/2020 Prepared by: Isabelle Course  Exercises Seated Hamstring Stretch - 1 x daily - 7 x weekly - 1 sets - 2 reps - 20-30 seconds hold Seated Hip Flexor Stretch - 1 x daily - 7 x weekly - 1 sets - 2 reps - 20 seconds hold Seated Figure 4 Piriformis Stretch - 1 x daily - 7 x weekly - 1 sets - 2 reps - 20-30 seconds hold Standing Single Leg Stance with Counter Support - 1 x daily - 7 x weekly - 3 sets - 1 reps - up to 30 seconds hold Forward Step Touch - 1 x daily - 7 x weekly - 3 sets - 10 reps Side Stepping with Counter Support - 1 x daily - 7 x weekly - 2 sets - 2 reps Standing Hip Extension with Counter Support - 1 x daily - 7 x weekly - 2 sets - 10 reps Standing Tandem Balance with Counter Support - 1 x daily - 7 x weekly - 3 sets - 10 reps - up to 30 seconds hold Forward Step Up with Counter Support - 1 x daily - 7 x weekly - 2 sets - 10 reps Lateral Step Up with Counter Support - 1 x daily - 7 x weekly - 2 sets - 10 reps

## 2020-09-16 NOTE — Therapy (Signed)
[place], [place], [address] Phone: [phone]   Fax:  [phone]  Physical Therapy Treatment  Patient Details  Name: Devin [name] MRN: [id number] Date of Birth: 01/04/1941 Referring Provider (PT): Sela Hilding   Encounter Date: 09/16/2020   PT End of Session - 09/16/20 1427     Visit Number 7    Number of Visits 12    Date for PT Re-Evaluation 10/07/20    Authorization - Visit Number 7    Authorization - Number of Visits 12    Progress Note Due on Visit 10    PT Start Time 1884    PT Stop Time 1427    PT Time Calculation (min) 42 min    Activity Tolerance Patient tolerated treatment well    Behavior During Therapy [facility] for tasks assessed/performed             Past Medical History:  Diagnosis Date   Arthritis    History of pulmonary embolism 04/2016   Hypertension    Renal cell carcinoma [facility])     Past Surgical History:  Procedure Laterality Date   APPENDECTOMY     DENTAL SURGERY  03/2016   all upper teeth pulled and dentures.    HERNIA REPAIR  1985&2001   IR FLUORO GUIDED NEEDLE PLC ASPIRATION/INJECTION LOC  02/05/2020   right knee replacement   04/2016   right wrist plate due to fracture      ROBOT ASSISTED LAPAROSCOPIC NEPHRECTOMY Right 10/03/2016   Procedure: XI ROBOTIC ASSISTED LAPAROSCOPIC  PARTIAL NEPHRECTOMY;  Surgeon: Raynelle Bring, MD;  Location: [facility];  Service: Urology;  Laterality: Right;    There were no vitals filed for this visit.   Subjective Assessment - 09/16/20 1349     Subjective Pt states he is still taking pain meds to relieve hip pain with good results. States he feels "much better than I did". States he is performing HEP    Patient Stated Goals reduce pain and improve falls, walking without AD    Currently in Pain? No/denies                [facility] PT Assessment - 09/16/20 0001       Assessment   Medical Diagnosis repeated falls     Referring Provider (PT) Sela Hilding    Prior Therapy [facility]                           [facility] Adult PT Treatment/Exercise - 09/16/20 0001       Ambulation/Gait   Ambulation Distance (Feet) 100 Feet   x 2   Assistive device Straight cane    Gait Pattern Step-through pattern    Gait Comments gait with SPC with stepping over obstacles and up 6'' step wtih pt requiring CGA for stepping up, supervision for stepping over      Knee/Hip Exercises: Stretches   Passive Hamstring Stretch Right;Left;2 reps;20 seconds   seated   Piriformis Stretch Right;Left;2 reps;20 seconds   seated, both fig 4 push down and pulling knee towards opp shoulder.     Knee/Hip Exercises: Aerobic   Nustep L5: legs only x 5 min for warm up      Knee/Hip Exercises: Standing   Hip Extension Both;10 reps    Lateral Step Up Both;1 set;10 reps;Hand Hold: 2;Step Height: 6"    Forward Step Up Both;1 set;10 reps;Hand Hold: 1;Step Height: 6"  SLS SLS with toe taps front / side/back x 5 reps each leg, with light UE support on counter.    Other Standing Knee Exercises tandem stance 2 x 30 sec bilat with light touch on counter                    PT Education - 09/16/20 1427     Education Details updated HEP    Person(s) Educated Patient    Methods Explanation;Demonstration;Handout    Comprehension Returned demonstration;Verbalized understanding                 PT Long Term Goals - 09/14/20 1139       PT LONG TERM GOAL #1   Title Pt will be independent with HEP    Time 6    Period Weeks    Status On-going      PT LONG TERM GOAL #2   Title Pt will improve Berg balance score to >= 42/56 to demo improved balance    Baseline 40-6/27/22    Time 6    Period Weeks    Status Partially Met      PT LONG TERM GOAL #3   Title Pt will improve bilat hip strength to 4+/5 to tolerate walking and standing with decreased pain    Time 6    Period Weeks    Status On-going                    Plan - 09/16/20 1428     Clinical Impression Statement Pt with good tolerance to progression of step ups with 1 UE support, continues to require 2 UE support for lateral step ups. HEP updated for step ups and tandem stance. Pt continues to require frequent rest breaks due to fatigue. good balance with gait with obstacles, decreased cadence    PT Next Visit Plan dynamic gait and balance training, hip strength.  progress HEP to tolerance.    PT Home Exercise Plan [id number]    Consulted and Agree with Plan of Care Patient             Patient will benefit from skilled therapeutic intervention in order to improve the following deficits and impairments:     Visit Diagnosis: Muscle weakness (generalized)  Balance disorder  Decreased activity tolerance     Problem List Patient Active Problem List   Diagnosis Date Noted   Fracture of L5 vertebra ([place]) 03/31/2020   Intractable pain 03/31/2020   Impaired ambulation 03/31/2020   Closed L5 vertebral fracture (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[place]) 03/03/2020   Thoracic aortic aneurysm without rupture ([place]) 03/03/2020   Type 2 diabetes mellitus ([place]) 03/03/2020   Vitamin D deficiency 03/03/2020   Malignant tumor of kidney ([place]) 03/03/2020   Neoplasm of right kidney 10/03/2016   Renal lesion 05/06/2016   Delirium 05/05/2016   Pulmonary embolus ([place]) 05/05/2016   Status post total right knee replacement  05/03/2016   Osteoarthritis of right knee 04/20/2016  Rheumatoid arthritis involving right knee ([place]) 04/20/2016   Inguinal hernia without mention of obstruction or gangrene, unilateral or unspecified, (not specified as recurrent)-left 08/10/2011   Corey Caulfield, PT  Tiquan Bouch 09/16/2020, 2:29 PM  [facility] [place], [place], [address] Phone: [phone]   Fax:  [phone]  Name: LANIS STORLIE MRN: [id number] Date of Birth: November 01, 1940

## 2020-09-18 NOTE — Telephone Encounter (Signed)
Oral Chemotherapy Pharmacist Encounter  I spoke with patient for overview of: Revlimid for the treatment of multiple myeloma in conjunction with dexamethasone, planned duration until disease progression or unacceptable drug toxicity.  Counseled patient on administration, dosing, side effects, monitoring, drug-food interactions, safe handling, storage, and disposal.  Patient will take Revlimid 2m capsules, 1 capsule by mouth once daily, without regard to food, with a full glass of water.  Revlimid will be given 21 days on, 7 days off, repeat every 28 days.  Patient will take dexamethasone 460mtablets, 5 tablets (2020mby mouth once weekly with breakfast.  Revlimid start date: 09/22/20  Adverse effects of Revlimid include but are not limited to: nausea, constipation, diarrhea, abdominal pain, rash, fatigue, and decreased blood counts.    Reviewed with patient importance of keeping a medication schedule and plan for any missed doses. No barriers to medication adherence identified.  Medication reconciliation performed and medication/allergy list updated.  Patient continues on acyclovir and dexamethasone. Patient counseled on importance of daily aspirin 96m33mr VTE prophylaxis.  Insurance authorization for Revlimid has been obtained.  Revlimid prescription is being dispensed from [facility] is a limited distribution medication. This was delivered to patient's home 09/17/20.  All questions answered.  Mr. SchaHangced understanding and appreciation.   Medication education handout placed in mail for patient. Patient knows to call the office with questions or concerns. Oral Chemotherapy Clinic phone number provided to patient.   RebeLeron CroakarmD, BCPS Hematology/Oncology Clinical Pharmacist [facility]-[phone]/2022 11:15 AM

## 2020-09-22 ENCOUNTER — Encounter: Payer: Self-pay | Admitting: Physical Therapy

## 2020-09-22 ENCOUNTER — Ambulatory Visit (INDEPENDENT_AMBULATORY_CARE_PROVIDER_SITE_OTHER): Payer: Medicare HMO | Admitting: Physical Therapy

## 2020-09-22 DIAGNOSIS — M6281 Muscle weakness (generalized): Secondary | ICD-10-CM | POA: Diagnosis not present

## 2020-09-22 DIAGNOSIS — R2689 Other abnormalities of gait and mobility: Secondary | ICD-10-CM | POA: Diagnosis not present

## 2020-09-22 DIAGNOSIS — R6889 Other general symptoms and signs: Secondary | ICD-10-CM | POA: Diagnosis not present

## 2020-09-22 NOTE — Therapy (Signed)
[place], [place], [address] Phone: [phone]   Fax:  [phone]  Physical Therapy Treatment  Patient Details  Name: Devin [name] MRN: [id number] Date of Birth: 05-18-1940 Referring Provider (PT): Sela Hilding   Encounter Date: 09/22/2020   PT End of Session - 09/22/20 1154     Visit Number 8    Number of Visits 12    Date for PT Re-Evaluation 10/07/20    Authorization - Visit Number 8    Authorization - Number of Visits 12    Progress Note Due on Visit 10    PT Start Time 1148    PT Stop Time 1226    PT Time Calculation (min) 38 min    Activity Tolerance Patient tolerated treatment well    Behavior During Therapy [facility] for tasks assessed/performed             Past Medical History:  Diagnosis Date   Arthritis    History of pulmonary embolism 04/2016   Hypertension    Renal cell carcinoma [facility])     Past Surgical History:  Procedure Laterality Date   APPENDECTOMY     DENTAL SURGERY  03/2016   all upper teeth pulled and dentures.    HERNIA REPAIR  1985&2001   IR FLUORO GUIDED NEEDLE PLC ASPIRATION/INJECTION LOC  02/05/2020   right knee replacement   04/2016   right wrist plate due to fracture      ROBOT ASSISTED LAPAROSCOPIC NEPHRECTOMY Right 10/03/2016   Procedure: XI ROBOTIC ASSISTED LAPAROSCOPIC  PARTIAL NEPHRECTOMY;  Surgeon: Raynelle Bring, MD;  [facility];  Service: Urology;  Laterality: Right;    There were no vitals filed for this visit.   Subjective Assessment - 09/22/20 1154     Subjective Pt reports fatigue from having 2 bbq's this weekend. He is using cane all the time in house and community (as long as it's not too far).    Pertinent History Multiple myeloma (active treatment), Lt hip ORIF, RA    Patient Stated Goals reduce pain and improve falls, walking without AD    Currently in Pain? Yes    Pain Score 5     Pain Location Hip    Pain Orientation  Left    Pain Descriptors / Indicators Sore    Aggravating Factors  prolonged sitting and standing    Pain Relieving Factors pain medicine                [facility] PT Assessment - 09/22/20 0001       Assessment   Medical Diagnosis repeated falls    Referring Provider (PT) Sela Hilding    Prior Therapy [facility]      Strength   Right Hip ABduction 4/5    Left Hip ABduction 4-/5              [facility] Adult PT Treatment/Exercise - 09/22/20 0001       Ambulation/Gait   Ambulation Distance (Feet) 100 Feet    Assistive device Straight cane    Gait Pattern Step-through pattern;Decreased step length - right;Decreased weight shift to right    Gait Comments cues to increase step length RLE with good follow over, minor cues on cane placement.      Knee/Hip Exercises: Stretches   Passive Hamstring Stretch Right;Left;2 reps;20 seconds   seated   Hip Flexor Stretch Right;Left;2 reps;30 seconds   seated with leg back   Piriformis Stretch Right;Left;2 reps;20 seconds  trial in supine; tolerated better than sitting   Other Knee/Hip Stretches single knee to chest x 15 sec each leg, 2 sets.  wide leg LTR for hip stretch x 8 reps      Knee/Hip Exercises: Aerobic   Recumbent Bike L1: 4.5 min for warm up.      Knee/Hip Exercises: Supine   Bridges Strengthening;1 set;10 reps    Bridges Limitations challenging; reports Rt hip discomfort      Knee/Hip Exercises: Sidelying   Hip ABduction Strengthening;Right;Left;1 set;10 reps    Clams each leg x 12 reps                PT Long Term Goals - 09/14/20 1139       PT LONG TERM GOAL #1   Title Pt will be independent with HEP    Time 6    Period Weeks    Status On-going      PT LONG TERM GOAL #2   Title Pt will improve Berg balance score to >= 42/56 to demo improved balance    Baseline 40-6/27/22    Time 6    Period Weeks    Status Partially Met      PT LONG TERM GOAL #3   Title Pt will improve bilat hip strength to 4+/5 to  tolerate walking and standing with decreased pain    Time 6    Period Weeks    Status On-going                   Plan - 09/22/20 1218     Clinical Impression Statement Pt tolerated seated and supine exercises well, reporting fatigue in bilat hips.  Hip strength is improving.  Gait quality gradually improving.    Rehab Potential Good    PT Frequency 2x / week    PT Duration 6 weeks    PT Treatment/Interventions Manual techniques;Therapeutic activities;Patient/family education;Balance training;Neuromuscular re-education;Therapeutic exercise;Cryotherapy;Stair training;Gait training;DME Instruction;Taping;Passive range of motion    PT Next Visit Plan dynamic gait and balance training, hip strength.  progress HEP to tolerance.    PT Home Exercise Plan [id number]    Consulted and Agree with Plan of Care Patient             Patient will benefit from skilled therapeutic intervention in order to improve the following deficits and impairments:  Pain, Impaired flexibility, Decreased strength, Decreased coordination, Decreased activity tolerance, Decreased balance, Difficulty walking  Visit Diagnosis: Muscle weakness (generalized)  Balance disorder  Decreased activity tolerance     Problem List Patient Active Problem List   Diagnosis Date Noted   Fracture of L5 vertebra ([place]) 03/31/2020   Intractable pain 03/31/2020   Impaired ambulation 03/31/2020   Closed L5 vertebral fracture ([place]) 03/31/2020   Chronic kidney disease, stage 3 unspecified ([place]) 03/03/2020   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/2020   Essential hypertension 03/03/2020   Hardening of the aorta (main artery of the heart) ([place]) 03/03/2020   Iron deficiency anemia 03/03/2020   Multiple myeloma ([place]) 03/03/2020   Obesity 03/03/2020   Personal history of colonic polyps 03/03/2020   Personal history of pulmonary embolism 03/03/2020   Prediabetes 03/03/2020   Primary gout 03/03/2020   Pure  hypercholesterolemia 03/03/2020   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[place]) 03/03/2020   Thoracic aortic aneurysm without rupture ([place]) 03/03/2020   Type 2 diabetes mellitus ([place] 03/03/2020   Vitamin D deficiency 03/03/2020   Malignant tumor of kidney ([place]) 03/03/2020   Neoplasm of right kidney  10/03/2016   Renal lesion 05/06/2016   Delirium 05/05/2016   Pulmonary embolus ([place]) 05/05/2016   Status post total right knee replacement 05/03/2016   Osteoarthritis of right knee 04/20/2016   Rheumatoid arthritis involving right knee ([place]) 04/20/2016   Inguinal hernia without mention of obstruction or gangrene, unilateral or unspecified, (not specified as recurrent)-left 08/10/2011   Kerin Perna, PTA 09/22/20 12:44 PM   [place], [place], [address] Phone: [phone]   Fax:  [phone]  Name: Devin [name] MRN: [id number] Date of Birth: 1940/05/28

## 2020-09-23 ENCOUNTER — Telehealth: Payer: Self-pay | Admitting: *Deleted

## 2020-09-23 NOTE — Telephone Encounter (Signed)
Returned PC to patient's wife, [name], she asked how patient is to take decadron now that he is on revlimid daily.  Per Dr. Alen Blew, patient is to take decadron as before, 20 mg once a week.  Pt's wife informed, she verbalizes understanding.

## 2020-09-24 ENCOUNTER — Ambulatory Visit (INDEPENDENT_AMBULATORY_CARE_PROVIDER_SITE_OTHER): Payer: Medicare HMO | Admitting: Physical Therapy

## 2020-09-24 ENCOUNTER — Encounter: Payer: Self-pay | Admitting: Physical Therapy

## 2020-09-24 ENCOUNTER — Other Ambulatory Visit: Payer: Self-pay

## 2020-09-24 DIAGNOSIS — R2689 Other abnormalities of gait and mobility: Secondary | ICD-10-CM | POA: Diagnosis not present

## 2020-09-24 DIAGNOSIS — R6889 Other general symptoms and signs: Secondary | ICD-10-CM

## 2020-09-24 DIAGNOSIS — M6281 Muscle weakness (generalized): Secondary | ICD-10-CM | POA: Diagnosis not present

## 2020-09-24 NOTE — Therapy (Signed)
[facility] [address] [place], [place], [address] Phone: [phone]   Fax:  [phone]  Physical Therapy Treatment  Patient Details  Name: Devin [name] MRN: [id number] Date of Birth: 03/20/1941 Referring Provider (PT): Kathryn [name]   Encounter Date: 09/24/2020   PT End of Session - 09/24/20 1146     Visit Number 9    Number of Visits 12    Date for PT Re-Evaluation 10/07/20    Authorization - Visit Number 9    Authorization - Number of Visits 12    Progress Note Due on Visit 10    PT Start Time 1146    PT Stop Time 1228    PT Time Calculation (min) 42 min    Activity Tolerance Patient tolerated treatment well    Behavior During Therapy [facility] for tasks assessed/performed             Past Medical History:  Diagnosis Date   Arthritis    History of pulmonary embolism 04/2016   Hypertension    Renal cell carcinoma ([facility])     Past Surgical History:  Procedure Laterality Date   APPENDECTOMY     DENTAL SURGERY  03/2016   all upper teeth pulled and dentures.    HERNIA REPAIR  1985&2001   IR FLUORO GUIDED NEEDLE PLC ASPIRATION/INJECTION LOC  02/05/2020   right knee replacement   04/2016   right wrist plate due to fracture      ROBOT ASSISTED LAPAROSCOPIC NEPHRECTOMY Right 10/03/2016   Procedure: XI ROBOTIC ASSISTED LAPAROSCOPIC  PARTIAL NEPHRECTOMY;  Surgeon: Borden, Lester, MD;  [facility];  Service: Urology;  Laterality: Right;    There were no vitals filed for this visit.   Subjective Assessment - 09/24/20 1158     Subjective Pt reports he over did it with walking with cane; his LLE was sore.  His wife reports that she watched him go up stairs yesterday and "It looked good".    Pertinent History Multiple myeloma (active treatment), Lt hip ORIF, RA    Patient Stated Goals reduce pain and improve falls, walking without AD    Currently in Pain? Yes    Pain Score 3     Pain Location Leg    Pain  Orientation Left    Pain Descriptors / Indicators Sore    Aggravating Factors  prolonged standing    Pain Relieving Factors pain medicine, rest.                [facility] PT Assessment - 09/24/20 0001       Assessment   Medical Diagnosis repeated falls    Referring Provider (PT) Kathryn [name]    Prior Therapy [facility]               [facility] Adult PT Treatment/Exercise - 09/24/20 0001       Knee/Hip Exercises: Stretches   Passive Hamstring Stretch Right;Left;2 reps;20 seconds   seated   Hip Flexor Stretch Right;Left;2 reps;30 seconds   seated with leg back   Piriformis Stretch Right;Left;2 reps;20 seconds   seated and supine     Knee/Hip Exercises: Aerobic   Nustep L4-5: 5.5 min legs only for warm up      Knee/Hip Exercises: Standing   Stairs 4-6" stairs with light UE support on bilat rails x 10 steps    SLS SLS with/without UE support x 3 reps each leg (able to let go of support 2 sec on LLE, 4 sec   on RLE).      Knee/Hip Exercises: Sidelying   Hip ABduction Strengthening;Right;Left;2 sets;10 reps             Balance Exercises - 09/24/20 0001       Balance Exercises: Standing   Rockerboard Lateral;EO;30 seconds    Tandem Gait Forward;Retro;Intermittent upper extremity support;2 reps    Step Over Hurdles / [place] forward gait over 2-7" hurdles, with one episode of LOB, req min A to steady. Side stepping over same obstacles; difficulty with 7" obstacle.                    PT Long Term Goals - 09/14/20 1139       PT LONG TERM GOAL #1   Title Pt will be independent with HEP    Time 6    Period Weeks    Status On-going      PT LONG TERM GOAL #2   Title Pt will improve Berg balance score to >= 42/56 to demo improved balance    Baseline 40-6/27/22    Time 6    Period Weeks    Status Partially Met      PT LONG TERM GOAL #3   Title Pt will improve bilat hip strength to 4+/5 to tolerate walking and standing with decreased pain    Time 6    Period  Weeks    Status On-going                   Plan - 09/24/20 1238     Clinical Impression Statement Pt able to ambulate over 2-7" obstacles with CGA, with one episode of LOB requiring Min A to steady (completed without AD).  Endurance is improving; requiring less seated rest breaks in between standing exercises. Making good gains each visit towards LTGs.    Rehab Potential Good    PT Frequency 2x / week    PT Duration 6 weeks    PT Treatment/Interventions Manual techniques;Therapeutic activities;Patient/family education;Balance training;Neuromuscular re-education;Therapeutic exercise;Cryotherapy;Stair training;Gait training;DME Instruction;Taping;Passive range of motion    PT Next Visit Plan 10 VISIT note*. dynamic gait and balance training, hip strength.  progress HEP to tolerance.    PT Home Exercise Plan [id number]    Consulted and Agree with Plan of Care Patient             Patient will benefit from skilled therapeutic intervention in order to improve the following deficits and impairments:  Pain, Impaired flexibility, Decreased strength, Decreased coordination, Decreased activity tolerance, Decreased balance, Difficulty walking  Visit Diagnosis: Balance disorder  Muscle weakness (generalized)  Decreased activity tolerance     Problem List Patient Active Problem List   Diagnosis Date Noted   Fracture of L5 vertebra (HCC) 03/31/2020   Intractable pain 03/31/2020   Impaired ambulation 03/31/2020   Closed L5 vertebral fracture (HCC) 03/31/2020   Chronic kidney disease, stage 3 unspecified ([facility]CC) 03/03/2020   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/2020   Essential hypertension 03/03/2020   Hardening of the aorta (main artery of the heart) (HCC) 03/03/2020   Iron deficiency anemia 03/03/2020   Multiple myeloma ([facility]CC) 03/03/2020   Obesity 03/03/2020   Personal history of colonic polyps 03/03/2020   Personal history of pulmonary embolism 03/03/2020    Prediabetes 03/03/2020   Primary gout 03/03/2020   Pure hypercholesterolemia 03/03/2020   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HCC) 03/03/2020   Thoracic aortic aneurysm without rupture (HCC) 03/03/2020   Type 2 diabetes mellitus (HCC) 03/03/2020     Vitamin D deficiency 03/03/2020   Malignant tumor of kidney (HCC) 03/03/2020   Neoplasm of right kidney 10/03/2016   Renal lesion 05/06/2016   Delirium 05/05/2016   Pulmonary embolus (HCC) 05/05/2016   Status post total right knee replacement 05/03/2016   Osteoarthritis of right knee 04/20/2016   Rheumatoid arthritis involving right knee (HCC) 04/20/2016   Inguinal hernia without mention of obstruction or gangrene, unilateral or unspecified, (not specified as recurrent)-left 08/10/2011   Jennifer Carlson-Long, PTA 09/24/20 12:46 PM  [facility] [address] [place], [place], [address] Phone: [phone]   Fax:  [phone]  Name: Devin [name] MRN: [id number] Date of Birth: 09/04/1940    

## 2020-09-29 ENCOUNTER — Ambulatory Visit (INDEPENDENT_AMBULATORY_CARE_PROVIDER_SITE_OTHER): Payer: Medicare HMO | Admitting: Physical Therapy

## 2020-09-29 ENCOUNTER — Other Ambulatory Visit: Payer: Self-pay

## 2020-09-29 DIAGNOSIS — R2689 Other abnormalities of gait and mobility: Secondary | ICD-10-CM

## 2020-09-29 DIAGNOSIS — M6281 Muscle weakness (generalized): Secondary | ICD-10-CM | POA: Diagnosis not present

## 2020-09-29 NOTE — Therapy (Addendum)
[place], [place], [address] Phone: [phone]   Fax:  [phone]  Physical Therapy Treatment and 10th visit note  Patient Details  Name: JELANI TRUEBA MRN: [id number] Date of Birth: April 27, 1940 Referring Provider (PT): Sela Hilding  Dates of Service 08/26/20-09/29/20 Encounter Date: 09/29/2020   PT End of Session - 09/29/20 1218     Visit Number 10    Number of Visits 12    Date for PT Re-Evaluation 10/07/20    Authorization - Visit Number 10    Authorization - Number of Visits 12    Progress Note Due on Visit 10    PT Start Time 1147    PT Stop Time 1225    PT Time Calculation (min) 38 min    Activity Tolerance Patient tolerated treatment well    Behavior During Therapy [facility] for tasks assessed/performed             Past Medical History:  Diagnosis Date   Arthritis    History of pulmonary embolism 04/2016   Hypertension    Renal cell carcinoma [facility])     Past Surgical History:  Procedure Laterality Date   APPENDECTOMY     DENTAL SURGERY  03/2016   all upper teeth pulled and dentures.    HERNIA REPAIR  1985&2001   IR FLUORO GUIDED NEEDLE PLC ASPIRATION/INJECTION LOC  02/05/2020   right knee replacement   04/2016   right wrist plate due to fracture      ROBOT ASSISTED LAPAROSCOPIC NEPHRECTOMY Right 10/03/2016   Procedure: XI ROBOTIC ASSISTED LAPAROSCOPIC  PARTIAL NEPHRECTOMY;  Surgeon: Raynelle Bring, MD;  Location: [facility];  Service: Urology;  Laterality: Right;    There were no vitals filed for this visit.   Subjective Assessment - 09/29/20 1222     Subjective Pt reports he is no longer using cane in house, but using it in the community.    Patient Stated Goals reduce pain and improve falls, walking without AD    Currently in Pain? Yes    Pain Score 5     Pain Location Back    Pain Orientation Lower    Pain Descriptors / Indicators Sore    Aggravating Factors  prolonged  standing    Pain Relieving Factors pain medicine, rest.                [facility] PT Assessment - 09/29/20 0001       Assessment   Medical Diagnosis repeated falls    Referring Provider (PT) Sela Hilding    Prior Therapy HHPT      Berg Balance Test   Sit to Stand Able to stand without using hands and stabilize independently    Standing Unsupported Able to stand safely 2 minutes    Sitting with Back Unsupported but Feet Supported on Floor or Stool Able to sit safely and securely 2 minutes    Stand to Sit Sits safely with minimal use of hands    Transfers Able to transfer safely, minor use of hands    Standing Unsupported with Eyes Closed Able to stand 10 seconds with supervision    Standing Unsupported with Feet Together Able to place feet together independently and stand 1 minute safely    From Standing, Reach Forward with Outstretched Arm Can reach forward >12 cm safely (5")    From Standing Position, Pick up Object from Floor Able to pick up shoe, needs supervision    From  Standing Position, Turn to Look Behind Over each Shoulder Looks behind one side only/other side shows less weight shift    Turn 360 Degrees Able to turn 360 degrees safely but slowly    Standing Unsupported, Alternately Place Feet on Step/Stool Able to stand independently and safely and complete 8 steps in 20 seconds    Standing Unsupported, One Foot in Front Able to plae foot ahead of the other independently and hold 30 seconds    Standing on One Leg Able to lift leg independently and hold 5-10 seconds    Total Score 48    Berg comment: short seated rest breaks after 2-3 activities                [facility] Adult PT Treatment/Exercise - 09/29/20 0001       Knee/Hip Exercises: Stretches   Passive Hamstring Stretch Right;Left;2 reps;20 seconds   seated   Hip Flexor Stretch Right;Left;2 reps;30 seconds   seated with leg back   Piriformis Stretch Right;Left;2 reps;20 seconds   supine   Other Knee/Hip  Stretches single knee to chest x 15 sec each leg.  wide leg LTR for hip stretch x 8 reps      Knee/Hip Exercises: Aerobic   Nustep L5: 5 min legs only for warm up      Knee/Hip Exercises: Standing   SLS SLS witthout UE support - LLE 7 sec, 10 sec, RLE x 5 sec x 2 reps      Knee/Hip Exercises: Sidelying   Hip ABduction Strengthening;Right;Left;2 sets;10 reps              PT Long Term Goals - 09/29/20 1210       PT LONG TERM GOAL #1   Title Pt will be independent with HEP    Time 6    Period Weeks    Status On-going      PT LONG TERM GOAL #2   Title Pt will improve Berg balance score to >= 42/56 to demo improved balance    Baseline 48- 09/29/20    Time 6    Period Weeks    Status Achieved      PT LONG TERM GOAL #3   Title Pt will improve bilat hip strength to 4+/5 to tolerate walking and standing with decreased pain    Time 6    Period Weeks    Status On-going                   Plan - 09/29/20 1212     Clinical Impression Statement Pt scored 48 on Berg; good improvement since last assessment. He is able to ambulate short distances now without cane with improved quality; encouraged pt to utilize cane in community for when he gets fatigued. He is tolerating strengthening exercises well.  Only requires short seated rest breaks at this time. Near meeting remaining goals.    Rehab Potential Good    PT Frequency 2x / week    PT Duration 6 weeks    PT Treatment/Interventions Manual techniques;Therapeutic activities;Patient/family education;Balance training;Neuromuscular re-education;Therapeutic exercise;Cryotherapy;Stair training;Gait training;DME Instruction;Taping;Passive range of motion    PT Next Visit Plan dynamic gait and balance training, hip strength.  finalize HEP. assess goals. assess readiness to d/c.    PT Home Exercise Plan [email or web address]    Consulted and Agree with Plan of Care Patient             Patient will benefit from skilled therapeutic  intervention in order to improve  the following deficits and impairments:  Pain, Impaired flexibility, Decreased strength, Decreased coordination, Decreased activity tolerance, Decreased balance, Difficulty walking  Visit Diagnosis: Balance disorder  Muscle weakness (generalized)     Problem List Patient Active Problem List   Diagnosis Date Noted   Fracture of L5 vertebra ([place]) 03/31/2020   Intractable pain 03/31/2020   Impaired ambulation 03/31/2020   Closed L5 vertebral fracture ([place]) 03/31/2020   Chronic kidney disease, stage 3 unspecified ([place]) 03/03/2020   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/2020   Essential hypertension 03/03/2020   Hardening of the aorta (main artery of the heart) ([place]) 03/03/2020   Iron deficiency anemia 03/03/2020   Multiple myeloma ([place]) 03/03/2020   Obesity 03/03/2020   Personal history of colonic polyps 03/03/2020   Personal history of pulmonary embolism 03/03/2020   Prediabetes 03/03/2020   Primary gout 03/03/2020   Pure hypercholesterolemia 03/03/2020   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[place]) 03/03/2020   Thoracic aortic aneurysm without rupture ([place]) 03/03/2020   Type 2 diabetes mellitus ([place]) 03/03/2020   Vitamin D deficiency 03/03/2020   Malignant tumor of kidney ([place]) 03/03/2020   Neoplasm of right kidney 10/03/2016   Renal lesion 05/06/2016   Delirium 05/05/2016   Pulmonary embolus ([place]) 05/05/2016   Status post total right knee replacement 05/03/2016   Osteoarthritis of right knee 04/20/2016   Rheumatoid arthritis involving right knee ([place]) 04/20/2016   Inguinal hernia without mention of obstruction or gangrene, unilateral or unspecified, (not specified as recurrent)-left 08/10/2011   Isabelle Course, PT,DPT07/13/221:42 PM  Kerin Perna, PTA 09/29/20 12:25 PM   [place] [address] [place], [place], [address] Phone: [phone]   Fax:  [phone]  Name:  GERONIMO DILIBERTO MRN: [id number] Date of Birth: 06-Sep-1940

## 2020-10-02 ENCOUNTER — Encounter: Payer: Self-pay | Admitting: Physical Therapy

## 2020-10-03 ENCOUNTER — Other Ambulatory Visit: Payer: Self-pay | Admitting: Oncology

## 2020-10-03 DIAGNOSIS — C419 Malignant neoplasm of bone and articular cartilage, unspecified: Secondary | ICD-10-CM

## 2020-10-03 DIAGNOSIS — C9000 Multiple myeloma not having achieved remission: Secondary | ICD-10-CM

## 2020-10-05 ENCOUNTER — Inpatient Hospital Stay: Payer: Medicare HMO | Attending: Oncology

## 2020-10-05 ENCOUNTER — Inpatient Hospital Stay (HOSPITAL_BASED_OUTPATIENT_CLINIC_OR_DEPARTMENT_OTHER): Payer: Medicare HMO | Admitting: Oncology

## 2020-10-05 ENCOUNTER — Other Ambulatory Visit: Payer: Self-pay

## 2020-10-05 ENCOUNTER — Other Ambulatory Visit: Payer: Self-pay | Admitting: *Deleted

## 2020-10-05 VITALS — BP 151/78 | HR 71 | Temp 98.2°F | Resp 18 | Ht 70.0 in | Wt 177.7 lb

## 2020-10-05 DIAGNOSIS — C9000 Multiple myeloma not having achieved remission: Secondary | ICD-10-CM | POA: Insufficient documentation

## 2020-10-05 DIAGNOSIS — G8929 Other chronic pain: Secondary | ICD-10-CM | POA: Diagnosis not present

## 2020-10-05 LAB — CBC WITH DIFFERENTIAL (CANCER CENTER ONLY)
Abs Immature Granulocytes: 0.04 10*3/uL (ref 0.00–0.07)
Basophils Absolute: 0 10*3/uL (ref 0.0–0.1)
Basophils Relative: 0 %
Eosinophils Absolute: 0.3 10*3/uL (ref 0.0–0.5)
Eosinophils Relative: 3 %
HCT: 36 % — ABNORMAL LOW (ref 39.0–52.0)
Hemoglobin: 12.4 g/dL — ABNORMAL LOW (ref 13.0–17.0)
Immature Granulocytes: 1 %
Lymphocytes Relative: 11 %
Lymphs Abs: 0.9 10*3/uL (ref 0.7–4.0)
MCH: 32 pg (ref 26.0–34.0)
MCHC: 34.4 g/dL (ref 30.0–36.0)
MCV: 92.8 fL (ref 80.0–100.0)
Monocytes Absolute: 1.1 10*3/uL — ABNORMAL HIGH (ref 0.1–1.0)
Monocytes Relative: 13 %
Neutro Abs: 6.4 10*3/uL (ref 1.7–7.7)
Neutrophils Relative %: 72 %
Platelet Count: 132 10*3/uL — ABNORMAL LOW (ref 150–400)
RBC: 3.88 MIL/uL — ABNORMAL LOW (ref 4.22–5.81)
RDW: 14.5 % (ref 11.5–15.5)
WBC Count: 8.8 10*3/uL (ref 4.0–10.5)
nRBC: 0 % (ref 0.0–0.2)

## 2020-10-05 LAB — CMP (CANCER CENTER ONLY)
ALT: 14 U/L (ref 0–44)
AST: 8 U/L — ABNORMAL LOW (ref 15–41)
Albumin: 3.5 g/dL (ref 3.5–5.0)
Alkaline Phosphatase: 46 U/L (ref 38–126)
Anion gap: 11 (ref 5–15)
BUN: 12 mg/dL (ref 8–23)
CO2: 23 mmol/L (ref 22–32)
Calcium: 8.7 mg/dL — ABNORMAL LOW (ref 8.9–10.3)
Chloride: 102 mmol/L (ref 98–111)
Creatinine: 1.09 mg/dL (ref 0.61–1.24)
GFR, Estimated: >60 mL/min (ref >60)
Glucose, Bld: 172 mg/dL — ABNORMAL HIGH (ref 70–99)
Potassium: 3.6 mmol/L (ref 3.5–5.1)
Sodium: 136 mmol/L (ref 135–145)
Total Bilirubin: 0.8 mg/dL (ref 0.3–1.2)
Total Protein: 6.8 g/dL (ref 6.5–8.1)

## 2020-10-05 MED ORDER — DEXAMETHASONE 4 MG PO TABS: ORAL_TABLET | ORAL | 3 refills | Status: AC

## 2020-10-05 MED ORDER — ACYCLOVIR 400 MG PO TABS: 400.0000 mg | ORAL_TABLET | Freq: Every day | ORAL | 3 refills | Status: AC

## 2020-10-05 MED ORDER — MORPHINE SULFATE ER 30 MG PO TBCR: 30.0000 mg | EXTENDED_RELEASE_TABLET | Freq: Two times a day (BID) | ORAL | 0 refills | Status: DC

## 2020-10-05 NOTE — Progress Notes (Signed)
Hematology and Oncology Follow Up Visit  Devin [name] [id number] [birth year] 80 y.o. 10/05/2020 8:45 AM Devin [name], MDTimberlake, [name], *   Principle Diagnosis: 80 year old man with IgA lambda multiple myeloma diagnosed in 02/2020. He presented with pathological bone fractures, 50% plasma cell involvement in the marrow, 3 q. deletion, duplication 1 q, (38;18)   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   Prior Therapy:   He status post right robotic laparoscopic partial nephrectomy on October 03, 2016.  Palliative radiation therapy to the spine.  He completed 20 Gy in 10 fractions on March 24, 2020.  Velcade and dexamethasone started on January 7 to be given weekly.  Last cycle given on 09/11/2020  Current therapy: Revlimid 10 mg po daily for 21 days with dexamethasone 20 mg weekly started on 09/22/2020.      Interim History: Devin [name] is here for a follow up.  Since the last visit, he started Revlimid with dexamethasone without any complications.  He denies any nausea, vomiting or worsening neuropathy.  He denies skin rash or excessive fatigue.  His performance status and quality of life remains unchanged.   He had denies any worsening bone pain or pathological fractures.  He denies any recent falls or syncope.  He continues to improve with his ambulation using a cane without any recent falls.    Medications: updated on review Current Outpatient Medications  Medication Sig Dispense Refill   acetaminophen (TYLENOL) 500 MG tablet Take 1,000 mg by mouth daily as needed for mild pain or moderate pain.     acyclovir (ZOVIRAX) 400 MG tablet Take 1 tablet (400 mg total) by mouth daily. 90 tablet 3   allopurinol (ZYLOPRIM) 300 MG tablet Take 300 mg by mouth daily.      amLODipine (NORVASC) 10 MG tablet Take 10 mg by mouth daily.     ASPIRIN LOW DOSE 81 MG EC tablet Take 81 mg by mouth daily.     Calcium  Carb-Cholecalciferol (OYSTER SHELL CALCIUM W/D) 500-200 MG-UNIT TABS TAKE 1 TABLET BY MOUTH TWICE A DAY 180 tablet 1   calcium-vitamin D (OSCAL WITH D) 500-200 MG-UNIT tablet Take 1 tablet by mouth 2 (two) times daily. 60 tablet 3   dexamethasone (DECADRON) 4 MG tablet Take 5 tablets weekly on the day of cancer treatment. 60 tablet 3   folic acid (FOLVITE) 1 MG tablet Take 1 mg by mouth daily.     furosemide (LASIX) 20 MG tablet Take 20 mg by mouth 2 (two) times daily. (Patient not taking: Reported on 08/26/2020)     labetalol (NORMODYNE) 300 MG tablet Take 300 mg by mouth 2 (two) times daily. (Patient not taking: Reported on 08/26/2020)     lenalidomide (REVLIMID) 10 MG capsule Take 1 capsule (10 mg total) by mouth daily. 21 capsule 0   lidocaine (LIDODERM) 5 % Place 1 patch onto the skin every 12 (twelve) hours. Remove & Discard patch within 12 hours or as directed by MD (Patient not taking: Reported on 08/26/2020) 20 patch 0   losartan (COZAAR) 100 MG tablet Take 100 mg by mouth daily. (Patient not taking: Reported on 08/26/2020)     morphine (MS CONTIN) 30 MG 12 hr tablet Take 1 tablet (30 mg total) by mouth every 12 (twelve) hours. 60 tablet 0   morphine (MSIR) 15 MG tablet Take 1 tablet (15 mg total) by mouth every 4 (four) hours as needed for severe pain. Devin [name]  tablet 0   NARCAN 4 MG/0.1ML LIQD nasal spray kit Place 1 spray into the nose once. (Patient not taking: Reported on 08/26/2020)     polyethylene glycol (MIRALAX / GLYCOLAX) 17 g packet Take 17 g by mouth daily as needed for moderate constipation. 14 each 0   rosuvastatin (CRESTOR) 5 MG tablet Take 5 mg by mouth daily.     senna-docusate (SENOKOT-S) 8.6-50 MG tablet Take 1 tablet by mouth at bedtime. 100 tablet 0   Vitamin D, Ergocalciferol, (DRISDOL) 1.25 MG (50000 UNIT) CAPS capsule Take 50,000 Units by mouth every 14 (fourteen) days.     No current facility-administered medications for this visit.     Allergies:  Allergies  Allergen  Reactions   Amlodipine Swelling   Pravastatin Other (See Comments)    Joint pain   Zestril [Lisinopril] Other (See Comments)    Gout   ( Zestril) gout   Other Other (See Comments)   Oxycodone Other (See Comments)    Causes agitation       Physical Exam:     Blood pressure (!) 151/78, pulse 71, temperature 98.2 F (36.8 C), temperature source Oral, resp. rate 18, height _0  (1.778 m), weight 177 lb 11.2 oz (80.6 kg), SpO2 99 %.    ECOG:  1   General appearance: Comfortable appearing without any discomfort Head: Normocephalic without any trauma Oropharynx: Mucous membranes are moist and pink without any thrush or ulcers. Eyes: Pupils are equal and round reactive to light. Lymph nodes: No cervical, supraclavicular, inguinal or axillary lymphadenopathy.   Heart:regular rate and rhythm.  S1 and S2 without leg edema. Lung: Clear without any rhonchi or wheezes.  No dullness to percussion. Abdomin: Soft, nontender, nondistended with good bowel sounds.  No hepatosplenomegaly. Musculoskeletal: No joint deformity or effusion.  Full range of motion noted. Neurological: No deficits noted on motor, sensory and deep tendon reflex exam. Skin: No petechial rash or dryness.  Appeared moist.          Lab Results: Lab Results  Component Value Date   WBC 8.8 09/11/2020   HGB 12.7 (L) 09/11/2020   HCT 36.1 (L) 09/11/2020   MCV 89.8 09/11/2020   PLT 140 (L) 09/11/2020     Chemistry      Component Value Date/Time   NA 134 (L) 09/11/2020 1228   K 4.4 09/11/2020 1228   CL 101 09/11/2020 1228   CO2 20 (L) 09/11/2020 1228   BUN 20 09/11/2020 1228   CREATININE 1.24 09/11/2020 1228      Component Value Date/Time   CALCIUM 8.9 09/11/2020 1228   ALKPHOS 55 09/11/2020 1228   AST 11 (L) 09/11/2020 1228   ALT 8 09/11/2020 1228   BILITOT 0.8 09/11/2020 1228          Impression and Plan:  80 year old with:  1.  Multiple myeloma presented with bone fracture and found to  have IgA lambda in 02/2020.    He is completing cycle 1 of Revlimid with dexamethasone without complications. Risks and benefits of contuning treatment reviewed. These include nausea, fatigue, rash and cytopenias.  After discussion he is agreeable to continue.  Laboratory data reviewed personally today and showed no severe derangements.  2.  Compression fracture of the spine: No pain reported.  Pain is manageable at this time.  3.  Antiemetics: compazine is available to him. No nausea or vomiting reported.   5.  VZV prophylaxis: I recommended continuing acyclovir at this time.  No reactivation noted.  6.  Bone directed therapy: Next Zometa will be in 10/2020. Complications such osteonecrosis of the jaw were discussed.  He is agreeable to continue.  7.  Chronic pain: currently on morphine without  pain exacerbation.    8.  Follow-up: in one month for repeat evaluation.   30  minutes were dedicated to this encounter.  The time was spent on discussing disease status, treatment choices, and addressing complications related to cancer and cancer treatment.      Zola Button, MD 7/18/20228:45 AM

## 2020-10-05 NOTE — Addendum Note (Signed)
Addended by: Wyatt Portela on: 10/05/2020 10:26 AM   Modules accepted: Orders

## 2020-10-05 NOTE — Telephone Encounter (Signed)
Unable to refill until 10/07/2020

## 2020-10-06 LAB — KAPPA/LAMBDA LIGHT CHAINS
Kappa free light chain: 17.8 mg/L (ref 3.3–19.4)
Kappa, lambda light chain ratio: 0.1 — ABNORMAL LOW (ref 0.26–1.65)
Lambda free light chains: 177 mg/L — ABNORMAL HIGH (ref 5.7–26.3)

## 2020-10-07 ENCOUNTER — Encounter: Payer: Medicare HMO | Admitting: Physical Therapy

## 2020-10-07 ENCOUNTER — Other Ambulatory Visit: Payer: Self-pay | Admitting: *Deleted

## 2020-10-07 DIAGNOSIS — C9000 Multiple myeloma not having achieved remission: Secondary | ICD-10-CM

## 2020-10-07 MED ORDER — LENALIDOMIDE 10 MG PO CAPS
10.0000 mg | ORAL_CAPSULE | Freq: Every day | ORAL | 0 refills | Status: DC
Start: 2020-10-07 — End: 2020-11-02

## 2020-10-12 DIAGNOSIS — L821 Other seborrheic keratosis: Secondary | ICD-10-CM | POA: Diagnosis not present

## 2020-10-12 DIAGNOSIS — L570 Actinic keratosis: Secondary | ICD-10-CM | POA: Diagnosis not present

## 2020-10-12 DIAGNOSIS — D1801 Hemangioma of skin and subcutaneous tissue: Secondary | ICD-10-CM | POA: Diagnosis not present

## 2020-10-12 DIAGNOSIS — L578 Other skin changes due to chronic exposure to nonionizing radiation: Secondary | ICD-10-CM | POA: Diagnosis not present

## 2020-10-12 LAB — MULTIPLE MYELOMA PANEL, SERUM
Albumin SerPl Elph-Mcnc: 3.6 g/dL (ref 2.9–4.4)
Albumin/Glob SerPl: 1.5 (ref 0.7–1.7)
Alpha 1: 0.2 g/dL (ref 0.0–0.4)
Alpha2 Glob SerPl Elph-Mcnc: 0.7 g/dL (ref 0.4–1.0)
B-Globulin SerPl Elph-Mcnc: 0.7 g/dL (ref 0.7–1.3)
Gamma Glob SerPl Elph-Mcnc: 0.9 g/dL (ref 0.4–1.8)
Globulin, Total: 2.5 g/dL (ref 2.2–3.9)
IgA: 671 mg/dL — ABNORMAL HIGH (ref 61–437)
IgG (Immunoglobin G), Serum: 350 mg/dL — ABNORMAL LOW (ref 603–1613)
IgM (Immunoglobulin M), Srm: 10 mg/dL — ABNORMAL LOW (ref 15–143)
M Protein SerPl Elph-Mcnc: 0.3 g/dL — ABNORMAL HIGH
Total Protein ELP: 6.1 g/dL (ref 6.0–8.5)

## 2020-10-12 NOTE — Telephone Encounter (Signed)
Oral Oncology Patient Advocate Encounter  Prior Authorization for Revlimid has been approved.    PA# [id number] Effective dates: 09/18/20 through 03/10/21  Oral Oncology Clinic will continue to follow.   [place] [facility] [place] Phone [phone] Fax [phone] 10/12/2020 11:40 AM

## 2020-10-15 ENCOUNTER — Other Ambulatory Visit: Payer: Self-pay

## 2020-10-15 ENCOUNTER — Encounter: Payer: Self-pay | Admitting: Physical Therapy

## 2020-10-15 ENCOUNTER — Ambulatory Visit (INDEPENDENT_AMBULATORY_CARE_PROVIDER_SITE_OTHER): Payer: Medicare HMO | Admitting: Physical Therapy

## 2020-10-15 DIAGNOSIS — M6281 Muscle weakness (generalized): Secondary | ICD-10-CM

## 2020-10-15 DIAGNOSIS — R2689 Other abnormalities of gait and mobility: Secondary | ICD-10-CM

## 2020-10-15 DIAGNOSIS — R6889 Other general symptoms and signs: Secondary | ICD-10-CM

## 2020-10-15 NOTE — Therapy (Addendum)
[place], [place], [address] Phone: [phone]   Fax:  [phone]  Physical Therapy Treatment and Discharge  Patient Details  Name: Devin [name] MRN: [id number] Date of Birth: [birth year] Referring Provider (PT): Sela Hilding   Encounter Date: 10/15/2020   PT End of Session - 10/15/20 1117     Visit Number 11    Number of Visits 12        Authorization - Visit Number 11    Authorization - Number of Visits 12    Progress Note Due on Visit 10    PT Start Time 1102    PT Stop Time 1142    PT Time Calculation (min) 40 min    Activity Tolerance Patient tolerated treatment well    Behavior During Therapy [facility] for tasks assessed/performed             Past Medical History:  Diagnosis Date   Arthritis    History of pulmonary embolism 04/2016   Hypertension    Renal cell carcinoma [facility])     Past Surgical History:  Procedure Laterality Date   APPENDECTOMY     DENTAL SURGERY  03/2016   all upper teeth pulled and dentures.    HERNIA REPAIR  1985&2001   IR FLUORO GUIDED NEEDLE PLC ASPIRATION/INJECTION LOC  02/05/2020   right knee replacement   04/2016   right wrist plate due to fracture      ROBOT ASSISTED LAPAROSCOPIC NEPHRECTOMY Right 10/03/2016   Procedure: XI ROBOTIC ASSISTED LAPAROSCOPIC  PARTIAL NEPHRECTOMY;  Surgeon: Raynelle Bring, MD;  Location: [facility];  Service: Urology;  Laterality: Right;    There were no vitals filed for this visit.   Subjective Assessment - 10/15/20 1109     Subjective Pt reports he is using cane only for longer distances in community.  He verbalizes readiness to d/c to HEP after today's visit.    Pertinent History Multiple myeloma (active treatment), Lt hip ORIF, RA    Patient Stated Goals reduce pain and improve falls, walking without AD    Currently in Pain? Yes    Pain Score 1     Pain Location Back    Pain Orientation Lower    Pain Descriptors  / Indicators Dull    Aggravating Factors  prolonged standing    Pain Relieving Factors pain medicine, rest.                [facility] PT Assessment - 10/15/20 0001       Assessment   Medical Diagnosis repeated falls    Referring Provider (PT) Sela Hilding    Prior Therapy HHPT      Strength   Right Hip Flexion 4+/5    Right Hip Extension 4/5    Right Hip ABduction 5/5    Left Hip Flexion 4+/5    Left Hip Extension 4/5    Left Hip ABduction 5/5              [facility] Adult PT Treatment/Exercise - 10/15/20 0001       Knee/Hip Exercises: Stretches   Passive Hamstring Stretch Right;Left;2 reps;60 seconds   seated   Hip Flexor Stretch Right;Left;2 reps;30 seconds   seated with leg back   Piriformis Stretch Right;Left;1 rep;30 seconds    Other Knee/Hip Stretches single knee to chest x 30 sec each leg.      Knee/Hip Exercises: Aerobic   Nustep L5: legs/arms x 6 min for warm up  Knee/Hip Exercises: Standing   SLS SLS with intermittent UE to steady x 10 sec x 2 reps each,  SLS with toe taps front/side/back x 5 each leg with UE support on counter.              PT Long Term Goals - 10/15/20 1119       PT LONG TERM GOAL #1   Title Pt will be independent with HEP    Time 6    Period Weeks    Status Achieved      PT LONG TERM GOAL #2   Title Pt will improve Berg balance score to >= 42/56 to demo improved balance    Baseline 48- 09/29/20    Time 6    Period Weeks    Status Achieved      PT LONG TERM GOAL #3   Title Pt will improve bilat hip strength to 4+/5 to tolerate walking and standing with decreased pain    Time 6    Period Weeks    Status Partially Met                   Plan - 10/15/20 1300     Clinical Impression Statement Pt tolerated all exercises well, without increase in pain. Verbally reviewed HEP and updated. His LE strength has improved; has partially met LTG#3. He is able to walk without AD, with minimal gait deviations.  Pt  verbalized readiness to d/c at this time.    Rehab Potential Good    PT Frequency 2x / week    PT Duration 6 weeks    PT Treatment/Interventions Manual techniques;Therapeutic activities;Patient/family education;Balance training;Neuromuscular re-education;Therapeutic exercise;Cryotherapy;Stair training;Gait training;DME Instruction;Taping;Passive range of motion    PT Next Visit Plan d/c per pt request.    PT Home Exercise Plan [email or web address]    Consulted and Agree with Plan of Care Patient             Patient will benefit from skilled therapeutic intervention in order to improve the following deficits and impairments:  Pain, Impaired flexibility, Decreased strength, Decreased coordination, Decreased activity tolerance, Decreased balance, Difficulty walking  Visit Diagnosis: Balance disorder  Muscle weakness (generalized)  Decreased activity tolerance     Problem List Patient Active Problem List   Diagnosis Date Noted   Fracture of L5 vertebra ([place]) 03/31/2020   Intractable pain 03/31/2020   Impaired ambulation 03/31/2020   Closed L5 vertebral fracture ([place]) 03/31/2020   Chronic kidney disease, stage 3 unspecified ([place]) 03/03/2020   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/2020   Essential hypertension 03/03/2020   Hardening of the aorta (main artery of the heart) ([place]) 03/03/2020   Iron deficiency anemia 03/03/2020   Multiple myeloma ([place]) 03/03/2020   Obesity 03/03/2020   Personal history of colonic polyps 03/03/2020   Personal history of pulmonary embolism 03/03/2020   Prediabetes 03/03/2020   Primary gout 03/03/2020   Pure hypercholesterolemia 03/03/2020   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[place]) 03/03/2020   Thoracic aortic aneurysm without rupture ([place]) 03/03/2020   Type 2 diabetes mellitus ([place]) 03/03/2020   Vitamin D deficiency 03/03/2020   Malignant tumor of kidney ([place]) 03/03/2020   Neoplasm of right kidney 10/03/2016   Renal lesion 05/06/2016   Delirium 05/05/2016    Pulmonary embolus ([place]) 05/05/2016   Status post total right knee replacement 05/03/2016   Osteoarthritis of right knee 04/20/2016   Rheumatoid arthritis involving right knee ([place]) 04/20/2016   Inguinal hernia without mention of obstruction or gangrene, unilateral or  unspecified, (not specified as recurrent)-left 08/10/2011   PHYSICAL THERAPY DISCHARGE SUMMARY  Visits from Start of Care: 11  Current functional level related to goals / functional outcomes: Improved strength and balance    Remaining deficits: See above   Education / Equipment: HEP   Patient agrees to discharge. Patient goals were partially met. Patient is being discharged due to being pleased with the current functional level.  Isabelle Course, PT,DPT09/11/2209:54 AM   Kerin Perna, PTA 10/15/20 1:03 PM   [place], [place], [address] Phone: [phone]   Fax:  [phone]  Name: Devin [name] MRN: [id number] Date of Birth: [birth year]

## 2020-10-15 NOTE — Patient Instructions (Signed)
Access Code: [email or web address] URL: [email or web address] Date: 10/15/2020 Prepared by: [facility] - Outpatient Rehab [facility]  Exercises Standing Single Leg Stance with Counter Support - 1 x daily - 7 x weekly - 3 sets - 1 reps - up to 15 seconds hold Sit to Stand - 1 x daily - 7 x weekly - 1 sets - 5-10 reps Seated Hamstring Stretch - 1 x daily - 7 x weekly - 1 sets - 2 reps - 20-30 seconds hold Seated Hip Flexor Stretch - 1 x daily - 7 x weekly - 1 sets - 2 reps - 20 seconds hold Sidelying Hip Abduction - 1 x daily - 3 x weekly - 2 sets - 10 reps Supine Bridge - 1 x daily - 3 x weekly - 1 sets - 10 reps Supine Piriformis Stretch with Foot on Ground - 1 x daily - 7 x weekly - 1 sets - 2 reps - 20 seconds hold

## 2020-10-21 ENCOUNTER — Telehealth: Payer: Self-pay | Admitting: *Deleted

## 2020-10-21 NOTE — Telephone Encounter (Signed)
Patient wife called to inform Dr. Alen Blew of recent event, to "keep him in the loop". Patient outdoors watering flowers last evening . Stung by yellow jackets- wife estimated 10 + stings on under arm area and shoulder/neck. She states he fell while trying to get away from them. Bruises on body and some on his face. She called EMS and patient was evaluated on site.  He took OTC Benadryl 25 mg twice for itching last night, but has not taken since as itching is decreased. Ms. Planty called the Revlimid customer care line this am and was advised patient could take tylenol and use cold/ice compresses on stings and continue Revlimid.  Wife also said she called his PCP this am to report event and ask their advice - they recommended monitoring and calling them if any changes such as increase in swelling/pain or drainage from sting areas. Encouraged Ms. Ashline to follow all recommendations and contact this office if any updates.  Call routed to Dr. Alen Blew and desk nurse support

## 2020-10-29 ENCOUNTER — Other Ambulatory Visit: Payer: Self-pay | Admitting: Oncology

## 2020-10-29 DIAGNOSIS — C9000 Multiple myeloma not having achieved remission: Secondary | ICD-10-CM

## 2020-10-29 DIAGNOSIS — C419 Malignant neoplasm of bone and articular cartilage, unspecified: Secondary | ICD-10-CM

## 2020-10-29 MED ORDER — MORPHINE SULFATE ER 30 MG PO TBCR: 30.0000 mg | EXTENDED_RELEASE_TABLET | Freq: Two times a day (BID) | ORAL | 0 refills | Status: DC

## 2020-10-29 MED ORDER — MORPHINE SULFATE 15 MG PO TABS: 15.0000 mg | ORAL_TABLET | ORAL | 0 refills | Status: DC | PRN

## 2020-11-02 ENCOUNTER — Other Ambulatory Visit: Payer: Self-pay | Admitting: *Deleted

## 2020-11-02 DIAGNOSIS — C9000 Multiple myeloma not having achieved remission: Secondary | ICD-10-CM

## 2020-11-02 MED ORDER — LENALIDOMIDE 10 MG PO CAPS: 10.0000 mg | ORAL_CAPSULE | Freq: Every day | ORAL | 0 refills | Status: DC

## 2020-11-06 ENCOUNTER — Telehealth: Payer: Self-pay

## 2020-11-06 NOTE — Telephone Encounter (Signed)
Mrs. Ogaz has been made aware of Dr. Hazeline Junker recommendation and verbalized understanding.

## 2020-11-06 NOTE — Telephone Encounter (Signed)
-----   Message from Wyatt Portela, MD sent at 11/06/2020  1:49 PM EDT ----- I recommended continuing Tylenol for the time being.  If his pain gets severe over the weekend he can use the short acting morphine.  Thanks ----- Message ----- From: Burna Mortimer, CMA Sent: 11/06/2020   1:45 PM EDT To: Wyatt Portela, MD  Dr. Alen Blew,  Mrs. Vanessen called to report that the patient is feeling extremely fatigued and is having muscle pain/weakness. She states that he was bitten by a swarm of yellowjackets 2 weeks ago and was stung 10 times. The patient fell down while being bitten and got bruised up. He has been sore since. Mrs. Varin would like to know if you attribute his weakness and muscle pain to the Revlimid vs. the bee stings.  They will be here Monday to see you, but would like to know what you recommend as far as medication to help the pain. She reports that he has been taking the 12 hr morphine BID, but it doesn't seem to help his symptoms much. He is taking extra strength tylenol and apparently that did help. She would like to know if he should stop the tylenol and start back with the 4 hr morphine.   Please advise, Myriam Jacobson

## 2020-11-09 ENCOUNTER — Inpatient Hospital Stay: Payer: Medicare HMO | Attending: Oncology

## 2020-11-09 ENCOUNTER — Inpatient Hospital Stay (HOSPITAL_BASED_OUTPATIENT_CLINIC_OR_DEPARTMENT_OTHER): Payer: Medicare HMO | Admitting: Oncology

## 2020-11-09 ENCOUNTER — Other Ambulatory Visit: Payer: Self-pay

## 2020-11-09 ENCOUNTER — Inpatient Hospital Stay: Payer: Medicare HMO

## 2020-11-09 VITALS — BP 161/89 | HR 99 | Temp 98.9°F | Resp 18 | Wt 168.1 lb

## 2020-11-09 DIAGNOSIS — C9000 Multiple myeloma not having achieved remission: Secondary | ICD-10-CM

## 2020-11-09 DIAGNOSIS — C419 Malignant neoplasm of bone and articular cartilage, unspecified: Secondary | ICD-10-CM | POA: Diagnosis not present

## 2020-11-09 DIAGNOSIS — G8929 Other chronic pain: Secondary | ICD-10-CM | POA: Insufficient documentation

## 2020-11-09 LAB — CBC WITH DIFFERENTIAL (CANCER CENTER ONLY)
Abs Immature Granulocytes: 0.04 10*3/uL (ref 0.00–0.07)
Basophils Absolute: 0.1 10*3/uL (ref 0.0–0.1)
Basophils Relative: 2 %
Eosinophils Absolute: 0.7 10*3/uL — ABNORMAL HIGH (ref 0.0–0.5)
Eosinophils Relative: 15 %
HCT: 35.9 % — ABNORMAL LOW (ref 39.0–52.0)
Hemoglobin: 12.6 g/dL — ABNORMAL LOW (ref 13.0–17.0)
Immature Granulocytes: 1 %
Lymphocytes Relative: 16 %
Lymphs Abs: 0.8 10*3/uL (ref 0.7–4.0)
MCH: 32 pg (ref 26.0–34.0)
MCHC: 35.1 g/dL (ref 30.0–36.0)
MCV: 91.1 fL (ref 80.0–100.0)
Monocytes Absolute: 0.6 10*3/uL (ref 0.1–1.0)
Monocytes Relative: 12 %
Neutro Abs: 2.6 10*3/uL (ref 1.7–7.7)
Neutrophils Relative %: 54 %
Platelet Count: 182 10*3/uL (ref 150–400)
RBC: 3.94 MIL/uL — ABNORMAL LOW (ref 4.22–5.81)
RDW: 13.6 % (ref 11.5–15.5)
WBC Count: 4.8 10*3/uL (ref 4.0–10.5)
nRBC: 0 % (ref 0.0–0.2)

## 2020-11-09 LAB — CMP (CANCER CENTER ONLY)
ALT: 17 U/L (ref 0–44)
AST: 8 U/L — ABNORMAL LOW (ref 15–41)
Albumin: 3.4 g/dL — ABNORMAL LOW (ref 3.5–5.0)
Alkaline Phosphatase: 180 U/L — ABNORMAL HIGH (ref 38–126)
Anion gap: 11 (ref 5–15)
BUN: 10 mg/dL (ref 8–23)
CO2: 22 mmol/L (ref 22–32)
Calcium: 8.8 mg/dL — ABNORMAL LOW (ref 8.9–10.3)
Chloride: 103 mmol/L (ref 98–111)
Creatinine: 1.08 mg/dL (ref 0.61–1.24)
GFR, Estimated: >60 mL/min (ref >60)
Glucose, Bld: 197 mg/dL — ABNORMAL HIGH (ref 70–99)
Potassium: 3.6 mmol/L (ref 3.5–5.1)
Sodium: 136 mmol/L (ref 135–145)
Total Bilirubin: 0.7 mg/dL (ref 0.3–1.2)
Total Protein: 6.3 g/dL — ABNORMAL LOW (ref 6.5–8.1)

## 2020-11-09 NOTE — Progress Notes (Signed)
Hematology and Oncology Follow Up Visit  Devin [name] [id number] [birth year] 80 y.o. 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 deletion, duplication 1 q, (53;66) diagnosed in December 2021.  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   Prior Therapy:   He status post right robotic laparoscopic partial nephrectomy on October 03, 2016.  Palliative radiation therapy to the spine.  He completed 20 Gy in 10 fractions on March 24, 2020.  Velcade and dexamethasone started on January 7 to be given weekly.  Last cycle given on 09/11/2020  Current therapy: Revlimid 10 mg po daily for 21 days with dexamethasone 20 mg weekly started on 09/22/2020.      Interim History: Devin [name] returns today for repeat evaluation.  Since the last visit, he reports no major changes in his health.  He has tolerated Revlimid without any major complaints throughout the first 2 cycles.  He did sustain a fall after was attacked by hornets and had multiple bruises on his face.  Since that time, he has been reported increased musculoskeletal pain and overall weakness.  He has reported increase in his need of short acting morphine.  He denies any nausea, vomiting or abdominal pain.  He is eating well overall.  He does ambulate with the help of a walker.    Medications: Unchanged on review. Current Outpatient Medications  Medication Sig Dispense Refill   acetaminophen (TYLENOL) 500 MG tablet Take 1,000 mg by mouth daily as needed for mild pain or moderate pain.     acyclovir (ZOVIRAX) 400 MG tablet Take 1 tablet (400 mg total) by mouth daily. 90 tablet 3   allopurinol (ZYLOPRIM) 300 MG tablet Take 300 mg by mouth daily.      amLODipine (NORVASC) 10 MG tablet Take  10 mg by mouth daily.     ASPIRIN LOW DOSE 81 MG EC tablet Take 81 mg by mouth daily.     Calcium Carb-Cholecalciferol (OYSTER SHELL CALCIUM W/D) 500-200 MG-UNIT TABS TAKE 1 TABLET BY MOUTH TWICE A DAY 180 tablet 1   calcium-vitamin D (OSCAL WITH D) 500-200 MG-UNIT tablet Take 1 tablet by mouth 2 (two) times daily. 60 tablet 3   dexamethasone (DECADRON) 4 MG tablet Take 5 tablets weekly on the day of cancer treatment. 60 tablet 3   folic acid (FOLVITE) 1 MG tablet Take 1 mg by mouth daily.     furosemide (LASIX) 20 MG tablet Take 20 mg by mouth 2 (two) times daily. (Patient not taking: Reported on 08/26/2020)     labetalol (NORMODYNE) 300 MG tablet Take 300 mg by mouth 2 (two) times daily. (Patient not taking: Reported on 08/26/2020)     lenalidomide (REVLIMID) 10 MG capsule Take 1 capsule (10 mg total) by mouth daily. 21 capsule 0   lidocaine (LIDODERM) 5 % Place 1 patch onto the skin every 12 (twelve) hours. Remove & Discard patch within 12 hours or as directed by MD (Patient not taking: Reported on 08/26/2020) 20 patch 0   losartan (COZAAR) 100 MG tablet Take 100 mg by mouth daily. (Patient not taking: Reported on 08/26/2020)     morphine (MS CONTIN) 30 MG 12 hr tablet Take 1 tablet (30 mg total) by mouth every 12 (twelve) hours. 60 tablet 0   morphine (MSIR) 15  MG tablet Take 1 tablet (15 mg total) by mouth every 4 (four) hours as needed for severe pain. 60 tablet 0   NARCAN 4 MG/0.1ML LIQD nasal spray kit Place 1 spray into the nose once. (Patient not taking: Reported on 08/26/2020)     polyethylene glycol (MIRALAX / GLYCOLAX) 17 g packet Take 17 g by mouth daily as needed for moderate constipation. 14 each 0   rosuvastatin (CRESTOR) 5 MG tablet Take 5 mg by mouth daily.     senna-docusate (SENOKOT-S) 8.6-50 MG tablet Take 1 tablet by mouth at bedtime. 100 tablet 0   Vitamin D, Ergocalciferol, (DRISDOL) 1.25 MG (50000 UNIT) CAPS capsule Take 50,000 Units by mouth every 14 (fourteen) days.     No  current facility-administered medications for this visit.     Allergies:  Allergies  Allergen Reactions   Amlodipine Swelling   Pravastatin Other (See Comments)    Joint pain   Zestril [Lisinopril] Other (See Comments)    Gout   ( Zestril) gout   Other Other (See Comments)   Oxycodone Other (See Comments)    Causes agitation       Physical Exam:    Blood pressure (!) 161/89, pulse 99, temperature 98.9 F (37.2 C), temperature source Oral, resp. rate 18, weight 168 lb 1.6 oz (76.2 kg), SpO2 99 %.      ECOG:  1    General appearance: Comfortable appearing without any discomfort Head: Normocephalic without any trauma Oropharynx: Mucous membranes are moist and pink without any thrush or ulcers. Eyes: Pupils are equal and round reactive to light. Lymph nodes: No cervical, supraclavicular, inguinal or axillary lymphadenopathy.   Heart:regular rate and rhythm.  S1 and S2 without leg edema. Lung: Clear without any rhonchi or wheezes.  No dullness to percussion. Abdomin: Soft, nontender, nondistended with good bowel sounds.  No hepatosplenomegaly. Musculoskeletal: No joint deformity or effusion.  Full range of motion noted. Neurological: No deficits noted on motor, sensory and deep tendon reflex exam. Skin: No petechial rash or dryness.  Appeared moist.           Lab Results: Lab Results  Component Value Date   WBC 8.8 10/05/2020   HGB 12.4 (L) 10/05/2020   HCT 36.0 (L) 10/05/2020   MCV 92.8 10/05/2020   PLT 132 (L) 10/05/2020     Chemistry      Component Value Date/Time   NA 136 10/05/2020 0923   K 3.6 10/05/2020 0923   CL 102 10/05/2020 0923   CO2 23 10/05/2020 0923   BUN 12 10/05/2020 0923   CREATININE 1.09 10/05/2020 0923      Component Value Date/Time   CALCIUM 8.7 (L) 10/05/2020 0923   ALKPHOS 46 10/05/2020 0923   AST 8 (L) 10/05/2020 0923   ALT 14 10/05/2020 0923   BILITOT 0.8 10/05/2020 0923      Results for DAUD, CAYER (MRN  [id number]) as of 11/09/2020 09:01  Ref. Range 07/31/2020 13:31 10/05/2020 09:23  M Protein SerPl Elph-Mcnc Latest Ref Range: Not Observed g/dL 1.0 (H) 0.3 (H)  IFE 1 Unknown Comment (A) Comment (A)  Globulin, Total Latest Ref Range: 2.2 - 3.9 g/dL 3.7 2.5  B-Globulin SerPl Elph-Mcnc Latest Ref Range: 0.7 - 1.3 g/dL 1.0 0.7  IgG (Immunoglobin G), Serum Latest Ref Range: 603 - 1,613 mg/dL 424 (L) 350 (L)  IgM (Immunoglobulin M), Srm Latest Ref Range: 15 - 143 mg/dL 10 (L) 10 (L)  IgA Latest Ref Range: 61 - 437  mg/dL 1,287 (H) 671 (H)   Results for DEWELL, MONNIER (MRN [id number]) as of 11/09/2020 09:01  Ref. Range 07/31/2020 13:31 10/05/2020 09:23  Kappa free light chain Latest Ref Range: 3.3 - 19.4 mg/L 9.2 17.8  Lambda free light chains Latest Ref Range: 5.7 - 26.3 mg/L 260.7 (H) 177.0 (H)  Kappa, lambda light chain ratio Latest Ref Range: 0.26 - 1.65  0.04 (L) 0.10 (L)     Impression and Plan:  80 year old with:  1.  IgA lambda multiple myeloma diagnosed and December 2021.  He is currently on Revlimid and dexamethasone with excellent response to therapy.  Protein studies obtained on October 05, 2020 showed a drop of his M spike down to 0.3 g/dL and decrease in his IgA level close to normal range.  Risks and benefits of continuing this treatment were reviewed at this time.  Potential complications including nausea, fatigue and rash among others were reiterated.  For the time being I recommended continuing Revlimid without dexamethasone to eliminate unnecessary side effects associated with steroids.    2.  Chronic pain: Related to compression fracture and currently on morphine twice a day.  He has noticed increase in his pain as of late I have recommended changing his morphine to 3 times a day and use as needed dosing of short acting morphine as he is doing.  We will continue to assess his pain on future visits.    3.  Antiemetics: No nausea or vomiting reported at this time.  Compazine is  available to him.  5.  VZV prophylaxis: He is currently on acyclovir which I recommended continuing for the time being.   6.  Bone directed therapy: This will be given every 3 months.  He will receive it today.  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  His calcium is low and I recommended increasing his calcium supplementation and Zometa will be given with the next visit.   7.  Follow-up: He will return in 4 weeks cardiac follow-up visit.   30  minutes were spent on this visit.  The time was dedicated to reviewing his disease status, addressing complications related to cancer, cancer therapy and future plan of care discussion.    Zola Button, MD 8/22/20228:59 AM

## 2020-11-10 ENCOUNTER — Encounter: Payer: Self-pay | Admitting: Oncology

## 2020-11-24 ENCOUNTER — Telehealth: Payer: Self-pay | Admitting: *Deleted

## 2020-11-24 ENCOUNTER — Other Ambulatory Visit: Payer: Self-pay | Admitting: Oncology

## 2020-11-24 MED ORDER — MORPHINE SULFATE ER 30 MG PO TBCR: 30.0000 mg | EXTENDED_RELEASE_TABLET | Freq: Two times a day (BID) | ORAL | 0 refills | Status: DC

## 2020-11-24 NOTE — Telephone Encounter (Signed)
Wife called for refill of Ji's MS Contin.

## 2020-11-26 ENCOUNTER — Other Ambulatory Visit: Payer: Self-pay | Admitting: Oncology

## 2020-11-26 DIAGNOSIS — C9000 Multiple myeloma not having achieved remission: Secondary | ICD-10-CM

## 2020-11-30 ENCOUNTER — Encounter: Payer: Self-pay | Admitting: Oncology

## 2020-12-07 ENCOUNTER — Inpatient Hospital Stay: Payer: Medicare HMO

## 2020-12-07 ENCOUNTER — Other Ambulatory Visit: Payer: Self-pay

## 2020-12-07 ENCOUNTER — Inpatient Hospital Stay: Payer: Medicare HMO | Attending: Oncology | Admitting: Oncology

## 2020-12-07 VITALS — BP 141/86 | HR 77 | Temp 99.0°F | Resp 17 | Wt 172.0 lb

## 2020-12-07 DIAGNOSIS — C9000 Multiple myeloma not having achieved remission: Secondary | ICD-10-CM

## 2020-12-07 DIAGNOSIS — C419 Malignant neoplasm of bone and articular cartilage, unspecified: Secondary | ICD-10-CM

## 2020-12-07 LAB — CBC WITH DIFFERENTIAL (CANCER CENTER ONLY)
Abs Immature Granulocytes: 0.02 10*3/uL (ref 0.00–0.07)
Basophils Absolute: 0.1 10*3/uL (ref 0.0–0.1)
Basophils Relative: 2 %
Eosinophils Absolute: 0.4 10*3/uL (ref 0.0–0.5)
Eosinophils Relative: 12 %
HCT: 31.7 % — ABNORMAL LOW (ref 39.0–52.0)
Hemoglobin: 11 g/dL — ABNORMAL LOW (ref 13.0–17.0)
Immature Granulocytes: 1 %
Lymphocytes Relative: 20 %
Lymphs Abs: 0.7 10*3/uL (ref 0.7–4.0)
MCH: 31.8 pg (ref 26.0–34.0)
MCHC: 34.7 g/dL (ref 30.0–36.0)
MCV: 91.6 fL (ref 80.0–100.0)
Monocytes Absolute: 0.5 10*3/uL (ref 0.1–1.0)
Monocytes Relative: 13 %
Neutro Abs: 1.8 10*3/uL (ref 1.7–7.7)
Neutrophils Relative %: 52 %
Platelet Count: 138 10*3/uL — ABNORMAL LOW (ref 150–400)
RBC: 3.46 MIL/uL — ABNORMAL LOW (ref 4.22–5.81)
RDW: 14.6 % (ref 11.5–15.5)
WBC Count: 3.4 10*3/uL — ABNORMAL LOW (ref 4.0–10.5)
nRBC: 0 % (ref 0.0–0.2)

## 2020-12-07 LAB — CMP (CANCER CENTER ONLY)
ALT: 12 U/L (ref 0–44)
AST: 9 U/L — ABNORMAL LOW (ref 15–41)
Albumin: 3.4 g/dL — ABNORMAL LOW (ref 3.5–5.0)
Alkaline Phosphatase: 77 U/L (ref 38–126)
Anion gap: 13 (ref 5–15)
BUN: 11 mg/dL (ref 8–23)
CO2: 20 mmol/L — ABNORMAL LOW (ref 22–32)
Calcium: 8.6 mg/dL — ABNORMAL LOW (ref 8.9–10.3)
Chloride: 103 mmol/L (ref 98–111)
Creatinine: 1.05 mg/dL (ref 0.61–1.24)
GFR, Estimated: >60 mL/min (ref >60)
Glucose, Bld: 187 mg/dL — ABNORMAL HIGH (ref 70–99)
Potassium: 3.7 mmol/L (ref 3.5–5.1)
Sodium: 136 mmol/L (ref 135–145)
Total Bilirubin: 0.7 mg/dL (ref 0.3–1.2)
Total Protein: 6.5 g/dL (ref 6.5–8.1)

## 2020-12-07 MED ORDER — SODIUM CHLORIDE 0.9 % IV SOLN: Freq: Once | INTRAVENOUS | Status: AC

## 2020-12-07 MED ORDER — ZOLEDRONIC ACID 4 MG/100ML IV SOLN
4.0000 mg | Freq: Once | INTRAVENOUS | Status: AC
  Administered 2020-12-07: 4 mg via INTRAVENOUS
  Filled 2020-12-07: qty 100

## 2020-12-07 NOTE — Patient Instructions (Signed)
[facility]   Discharge Instructions: Thank you for choosing [facility] to provide your oncology and hematology care.   If you have a lab appointment with the [facility], please go directly to the [facility] and check in at the registration area.   Wear comfortable clothing and clothing appropriate for easy access to any Portacath or PICC line.   We strive to give you quality time with your provider. You may need to reschedule your appointment if you arrive late (15 or more minutes).  Arriving late affects you and other patients whose appointments are after yours.  Also, if you miss three or more appointments without notifying the office, you may be dismissed from the clinic at the provider's discretion.      For prescription refill requests, have your pharmacy contact our office and allow 72 hours for refills to be completed.    Today you received the following chemotherapy and/or immunotherapy agents Zoledronic Acid (Zometa).      To help prevent nausea and vomiting after your treatment, we encourage you to take your nausea medication as directed.  BELOW ARE SYMPTOMS THAT SHOULD BE REPORTED IMMEDIATELY: *FEVER GREATER THAN 100.4 F (38 C) OR HIGHER *CHILLS OR SWEATING *NAUSEA AND VOMITING THAT IS NOT CONTROLLED WITH YOUR NAUSEA MEDICATION *UNUSUAL SHORTNESS OF BREATH *UNUSUAL BRUISING OR BLEEDING *URINARY PROBLEMS (pain or burning when urinating, or frequent urination) *BOWEL PROBLEMS (unusual diarrhea, constipation, pain near the anus) TENDERNESS IN MOUTH AND THROAT WITH OR WITHOUT PRESENCE OF ULCERS (sore throat, sores in mouth, or a toothache) UNUSUAL RASH, SWELLING OR PAIN  UNUSUAL VAGINAL DISCHARGE OR ITCHING   Items with * indicate a potential emergency and should be followed up as soon as possible or go to the Emergency Department if any problems should occur.  Please show the CHEMOTHERAPY ALERT CARD or IMMUNOTHERAPY ALERT  CARD at check-in to the Emergency Department and triage nurse.  Should you have questions after your visit or need to cancel or reschedule your appointment, please contact [facility]  Dept: [phone]  and follow the prompts.  Office hours are 8:00 a.m. to 4:30 p.m. Monday - Friday. Please note that voicemails left after 4:00 p.m. may not be returned until the following business day.  We are closed weekends and major holidays. You have access to a nurse at all times for urgent questions. Please call the main number to the clinic Dept: [phone] and follow the prompts.   For any non-urgent questions, you may also contact your provider using MyChart. We now offer e-Visits for anyone 4 and older to request care online for non-urgent symptoms. For details visit [email or web address].   Also download the MyChart app! Go to the app store, search "MyChart", open the app, select [facility], and log in with your MyChart username and password.  Due to Covid, a mask is required upon entering the hospital/clinic. If you do not have a mask, one will be given to you upon arrival. For doctor visits, patients may have 1 support person aged 53 or older with them. For treatment visits, patients cannot have anyone with them due to current Covid guidelines and our immunocompromised population.

## 2020-12-07 NOTE — Progress Notes (Signed)
Hematology and Oncology Follow Up Visit  Devin [name] [id number] [birth year] 80 y.o. 12/07/2020 8:58 AM Devin [name], MDTimberlake, Anastasia [name], *   Principle Diagnosis: 80 year old man with IgA lambda multiple myeloma diagnosed in December 2021.  He was found to have 50% plasma cell involvement in the marrow, 3 q. deletion, duplication 1 q, (37;04) and bone involvement.  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   Prior Therapy:   He status post right robotic laparoscopic partial nephrectomy on October 03, 2016.  Palliative radiation therapy to the spine.  He completed 20 Gy in 10 fractions on March 24, 2020.  Velcade and dexamethasone started on January 7 to be given weekly.  Last cycle given on 09/11/2020  Current therapy:   Revlimid 10 mg po daily for 21 days with dexamethasone 20 mg weekly started on 09/22/2020.   Zometa 4 mg every 3 months.  Next injection scheduled on 12/07/2020.   Interim History: [name] is here for a follow-up visit.  Since last visit, he reports no major changes in his health.  He reports his pain is manageable with currently morphine twice a day and a short acting morphine militate.  He has not had any recent hospitalization or illnesses.  He denies any worsening bone pain or pathological fractures.  He has tolerated Revlimid without any recent complaints.  Denies any nausea vomiting or abdominal discomfort.    Medications: Reviewed without changes. Current Outpatient Medications  Medication Sig Dispense Refill   acetaminophen (TYLENOL) 500 MG tablet Take 1,000 mg by mouth daily as needed for mild pain or moderate pain.     acyclovir (ZOVIRAX) 400 MG tablet Take 1 tablet (400 mg total) by mouth daily. 90 tablet 3   allopurinol (ZYLOPRIM) 300 MG tablet Take 300 mg by mouth daily.      amLODipine (NORVASC) 10 MG tablet Take 10 mg by mouth daily.     ASPIRIN LOW DOSE 81 MG EC tablet  Take 81 mg by mouth daily.     Calcium Carb-Cholecalciferol (OYSTER SHELL CALCIUM W/D) 500-200 MG-UNIT TABS TAKE 1 TABLET BY MOUTH TWICE A DAY 180 tablet 1   calcium-vitamin D (OSCAL WITH D) 500-200 MG-UNIT tablet Take 1 tablet by mouth 2 (two) times daily. 60 tablet 3   dexamethasone (DECADRON) 4 MG tablet Take 5 tablets weekly on the day of cancer treatment. 60 tablet 3   folic acid (FOLVITE) 1 MG tablet Take 1 mg by mouth daily.     furosemide (LASIX) 20 MG tablet Take 20 mg by mouth 2 (two) times daily. (Patient not taking: Reported on 08/26/2020)     labetalol (NORMODYNE) 300 MG tablet Take 300 mg by mouth 2 (two) times daily. (Patient not taking: Reported on 08/26/2020)     lidocaine (LIDODERM) 5 % Place 1 patch onto the skin every 12 (twelve) hours. Remove & Discard patch within 12 hours or as directed by MD (Patient not taking: Reported on 08/26/2020) 20 patch 0   losartan (COZAAR) 100 MG tablet Take 100 mg by mouth daily. (Patient not taking: Reported on 08/26/2020)     morphine (MS CONTIN) 30 MG 12 hr tablet Take 1 tablet (30 mg total) by mouth every 12 (twelve) hours. 60 tablet 0   morphine (MSIR) 15 MG tablet Take 1 tablet (15 mg total) by mouth every 4 (four) hours as needed for severe pain. 60 tablet 0   NARCAN 4 MG/0.1ML LIQD nasal spray  kit Place 1 spray into the nose once. (Patient not taking: Reported on 08/26/2020)     polyethylene glycol (MIRALAX / GLYCOLAX) 17 g packet Take 17 g by mouth daily as needed for moderate constipation. 14 each 0   REVLIMID 10 MG capsule TAKE 1 CAPSULE BY MOUTH DAILY 21 capsule 0   rosuvastatin (CRESTOR) 5 MG tablet Take 5 mg by mouth daily.     senna-docusate (SENOKOT-S) 8.6-50 MG tablet Take 1 tablet by mouth at bedtime. 100 tablet 0   Vitamin D, Ergocalciferol, (DRISDOL) 1.25 MG (50000 UNIT) CAPS capsule Take 50,000 Units by mouth every 14 (fourteen) days.     No current facility-administered medications for this visit.     Allergies:  Allergies   Allergen Reactions   Amlodipine Swelling   Pravastatin Other (See Comments)    Joint pain   Zestril [Lisinopril] Other (See Comments)    Gout   ( Zestril) gout   Other Other (See Comments)   Oxycodone Other (See Comments)    Causes agitation       Physical Exam:    Blood pressure (!) 141/86, pulse 77, temperature 99 F (37.2 C), temperature source Oral, resp. rate 17, weight 172 lb (78 kg), SpO2 97 %.       ECOG:  1   General appearance: Alert, awake without any distress. Head: Atraumatic without abnormalities Oropharynx: Without any thrush or ulcers. Eyes: No scleral icterus. Lymph nodes: No lymphadenopathy noted in the cervical, supraclavicular, or axillary nodes Heart:regular rate and rhythm, without any murmurs or gallops.   Lung: Clear to auscultation without any rhonchi, wheezes or dullness to percussion. Abdomin: Soft, nontender without any shifting dullness or ascites. Musculoskeletal: No clubbing or cyanosis. Neurological: No motor or sensory deficits. Skin: No rashes or lesions.           Lab Results: Lab Results  Component Value Date   WBC 4.8 11/09/2020   HGB 12.6 (L) 11/09/2020   HCT 35.9 (L) 11/09/2020   MCV 91.1 11/09/2020   PLT 182 11/09/2020     Chemistry      Component Value Date/Time   NA 136 11/09/2020 0851   K 3.6 11/09/2020 0851   CL 103 11/09/2020 0851   CO2 22 11/09/2020 0851   BUN 10 11/09/2020 0851   CREATININE 1.08 11/09/2020 0851      Component Value Date/Time   CALCIUM 8.8 (L) 11/09/2020 0851   ALKPHOS 180 (H) 11/09/2020 0851   AST 8 (L) 11/09/2020 0851   ALT 17 11/09/2020 0851   BILITOT 0.7 11/09/2020 0851       Results for [name], [name] (MRN [id number]) as of 12/07/2020 09:00  Ref. Range 06/26/2020 13:22 07/31/2020 13:31 10/05/2020 09:23  M Protein SerPl Elph-Mcnc Latest Ref Range: Not Observed g/dL 0.9 (H) 1.0 (H) 0.3 (H)  IFE 1 Unknown Comment (A) Comment (A) Comment (A)  Globulin, Total Latest Ref Range:  2.2 - 3.9 g/dL 3.2 3.7 2.5  B-Globulin SerPl Elph-Mcnc Latest Ref Range: 0.7 - 1.3 g/dL 0.8 1.0 0.7  IgG (Immunoglobin G), Serum Latest Ref Range: 603 - 1,613 mg/dL 380 (L) 424 (L) 350 (L)  IgM (Immunoglobulin M), Srm Latest Ref Range: 15 - 143 mg/dL 9 (L) 10 (L) 10 (L)  IgA Latest Ref Range: 61 - 437 mg/dL 1,306 (H) 1,287 (H) 671 (H)     Impression and Plan:  80 year old with:  1.  Multiple myeloma diagnosed in December 2021.  He was found to have IgA lambda  subtype.  He has tolerated Revlimid and dexamethasone without any major complications.  Protein studies will be updated today and had an excellent response in July 2022 with M spike down to 0.3 g/dL.  Risks and benefits of continuing this treatment were reviewed.  Salvage therapy with daratumumab based regimen would be used at that time.  At this time he is agreeable to continue and we will update protein studies from today.    2.  Chronic pain: He is currently on morphine long-acting as well as breakthrough.  Pain is manageable without any need for adjusting.    3.  Antiemetics: Compazine is available to him without any nausea or vomiting.   4.  Hypocalcemia: We have discussed strategies to increase his calcium intake including calcium supplements and Tums.  5.  VZV prophylaxis: No reactivation noted at this time.  Acyclovir is available to him.   6.  Bone directed therapy: He is currently on Zometa.  This will be repeated today and in 3 months.  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  7.  Follow-up: In 6 weeks for repeat follow-up.   30  minutes were spent on this encounter.  Time was dedicated to reviewing laboratory data, disease status update, treatment choices and addressing complications related to cancer and cancer therapy.   Zola Button, MD 9/19/20228:58 AM

## 2020-12-08 DIAGNOSIS — Z23 Encounter for immunization: Secondary | ICD-10-CM | POA: Diagnosis not present

## 2020-12-08 LAB — KAPPA/LAMBDA LIGHT CHAINS
Kappa free light chain: 31.1 mg/L — ABNORMAL HIGH (ref 3.3–19.4)
Kappa, lambda light chain ratio: 0.16 — ABNORMAL LOW (ref 0.26–1.65)
Lambda free light chains: 199.1 mg/L — ABNORMAL HIGH (ref 5.7–26.3)

## 2020-12-09 LAB — MULTIPLE MYELOMA PANEL, SERUM
Albumin SerPl Elph-Mcnc: 3.2 g/dL (ref 2.9–4.4)
Albumin/Glob SerPl: 1.2 (ref 0.7–1.7)
Alpha 1: 0.3 g/dL (ref 0.0–0.4)
Alpha2 Glob SerPl Elph-Mcnc: 0.7 g/dL (ref 0.4–1.0)
B-Globulin SerPl Elph-Mcnc: 0.7 g/dL (ref 0.7–1.3)
Gamma Glob SerPl Elph-Mcnc: 1.1 g/dL (ref 0.4–1.8)
Globulin, Total: 2.7 g/dL (ref 2.2–3.9)
IgA: 613 mg/dL — ABNORMAL HIGH (ref 61–437)
IgG (Immunoglobin G), Serum: 572 mg/dL — ABNORMAL LOW (ref 603–1613)
IgM (Immunoglobulin M), Srm: 20 mg/dL (ref 15–143)
M Protein SerPl Elph-Mcnc: 0.5 g/dL — ABNORMAL HIGH
Total Protein ELP: 5.9 g/dL — ABNORMAL LOW (ref 6.0–8.5)

## 2020-12-11 ENCOUNTER — Other Ambulatory Visit: Payer: Self-pay | Admitting: *Deleted

## 2020-12-11 DIAGNOSIS — C649 Malignant neoplasm of unspecified kidney, except renal pelvis: Secondary | ICD-10-CM

## 2020-12-11 MED ORDER — MORPHINE SULFATE 15 MG PO TABS
15.0000 mg | ORAL_TABLET | ORAL | 0 refills | Status: DC | PRN
Start: 2020-12-11 — End: 2021-01-01

## 2020-12-18 DIAGNOSIS — N183 Chronic kidney disease, stage 3 unspecified: Secondary | ICD-10-CM | POA: Diagnosis not present

## 2020-12-18 DIAGNOSIS — I10 Essential (primary) hypertension: Secondary | ICD-10-CM | POA: Diagnosis not present

## 2020-12-18 DIAGNOSIS — Z0000 Encounter for general adult medical examination without abnormal findings: Secondary | ICD-10-CM | POA: Diagnosis not present

## 2020-12-18 DIAGNOSIS — Z23 Encounter for immunization: Secondary | ICD-10-CM | POA: Diagnosis not present

## 2020-12-18 DIAGNOSIS — L02411 Cutaneous abscess of right axilla: Secondary | ICD-10-CM | POA: Diagnosis not present

## 2020-12-18 DIAGNOSIS — E119 Type 2 diabetes mellitus without complications: Secondary | ICD-10-CM | POA: Diagnosis not present

## 2020-12-18 DIAGNOSIS — C641 Malignant neoplasm of right kidney, except renal pelvis: Secondary | ICD-10-CM | POA: Diagnosis not present

## 2020-12-18 DIAGNOSIS — I700 Atherosclerosis of aorta: Secondary | ICD-10-CM | POA: Diagnosis not present

## 2020-12-19 ENCOUNTER — Other Ambulatory Visit: Payer: Self-pay | Admitting: Oncology

## 2020-12-19 DIAGNOSIS — C9000 Multiple myeloma not having achieved remission: Secondary | ICD-10-CM

## 2020-12-22 ENCOUNTER — Other Ambulatory Visit: Payer: Self-pay | Admitting: Oncology

## 2020-12-22 MED ORDER — MORPHINE SULFATE ER 30 MG PO TBCR: 30.0000 mg | EXTENDED_RELEASE_TABLET | Freq: Two times a day (BID) | ORAL | 0 refills | Status: DC

## 2020-12-23 ENCOUNTER — Encounter: Payer: Self-pay | Admitting: Oncology

## 2020-12-24 ENCOUNTER — Telehealth: Payer: Self-pay | Admitting: *Deleted

## 2020-12-24 NOTE — Telephone Encounter (Signed)
Pt wife called stating pt is having symptoms that has worsened over days, including, fatigue, increased bp, extreme nausea, muscle pain, weakness, and decreased appetite. Per Dr.Shadad, advised pt to hold Revlimid and monitor symptoms. Pt wife verbalized understanding.

## 2020-12-30 ENCOUNTER — Other Ambulatory Visit: Payer: Self-pay

## 2020-12-30 ENCOUNTER — Inpatient Hospital Stay (HOSPITAL_BASED_OUTPATIENT_CLINIC_OR_DEPARTMENT_OTHER): Payer: Medicare HMO | Admitting: Oncology

## 2020-12-30 ENCOUNTER — Inpatient Hospital Stay: Payer: Medicare HMO | Attending: Oncology

## 2020-12-30 VITALS — BP 155/68 | HR 72 | Temp 98.0°F | Resp 17 | Ht 70.0 in | Wt 170.7 lb

## 2020-12-30 DIAGNOSIS — C9000 Multiple myeloma not having achieved remission: Secondary | ICD-10-CM | POA: Insufficient documentation

## 2020-12-30 DIAGNOSIS — C419 Malignant neoplasm of bone and articular cartilage, unspecified: Secondary | ICD-10-CM

## 2020-12-30 DIAGNOSIS — G8929 Other chronic pain: Secondary | ICD-10-CM | POA: Insufficient documentation

## 2020-12-30 LAB — CBC WITH DIFFERENTIAL (CANCER CENTER ONLY)
Abs Immature Granulocytes: 0.01 10*3/uL (ref 0.00–0.07)
Basophils Absolute: 0.1 10*3/uL (ref 0.0–0.1)
Basophils Relative: 3 %
Eosinophils Absolute: 0.4 10*3/uL (ref 0.0–0.5)
Eosinophils Relative: 12 %
HCT: 32.8 % — ABNORMAL LOW (ref 39.0–52.0)
Hemoglobin: 11.3 g/dL — ABNORMAL LOW (ref 13.0–17.0)
Immature Granulocytes: 0 %
Lymphocytes Relative: 27 %
Lymphs Abs: 1 10*3/uL (ref 0.7–4.0)
MCH: 31.7 pg (ref 26.0–34.0)
MCHC: 34.5 g/dL (ref 30.0–36.0)
MCV: 92.1 fL (ref 80.0–100.0)
Monocytes Absolute: 0.5 10*3/uL (ref 0.1–1.0)
Monocytes Relative: 15 %
Neutro Abs: 1.5 10*3/uL — ABNORMAL LOW (ref 1.7–7.7)
Neutrophils Relative %: 43 %
Platelet Count: 140 10*3/uL — ABNORMAL LOW (ref 150–400)
RBC: 3.56 MIL/uL — ABNORMAL LOW (ref 4.22–5.81)
RDW: 15.2 % (ref 11.5–15.5)
WBC Count: 3.5 10*3/uL — ABNORMAL LOW (ref 4.0–10.5)
nRBC: 0 % (ref 0.0–0.2)

## 2020-12-30 LAB — CMP (CANCER CENTER ONLY)
ALT: 13 U/L (ref 0–44)
AST: 16 U/L (ref 15–41)
Albumin: 3.8 g/dL (ref 3.5–5.0)
Alkaline Phosphatase: 57 U/L (ref 38–126)
Anion gap: 9 (ref 5–15)
BUN: 13 mg/dL (ref 8–23)
CO2: 24 mmol/L (ref 22–32)
Calcium: 9.2 mg/dL (ref 8.9–10.3)
Chloride: 105 mmol/L (ref 98–111)
Creatinine: 0.99 mg/dL (ref 0.61–1.24)
GFR, Estimated: >60 mL/min (ref >60)
Glucose, Bld: 105 mg/dL — ABNORMAL HIGH (ref 70–99)
Potassium: 3.9 mmol/L (ref 3.5–5.1)
Sodium: 138 mmol/L (ref 135–145)
Total Bilirubin: 0.5 mg/dL (ref 0.3–1.2)
Total Protein: 7.9 g/dL (ref 6.5–8.1)

## 2020-12-30 NOTE — Progress Notes (Signed)
Hematology and Oncology Follow Up Visit  Devin [name] [id number] Aug 06, 1940 80 y.o. 12/30/2020 3:08 PM Devin [name], MDTimberlake, [name], *   Principle Diagnosis: 80 year old man with multiple myeloma diagnosed with 50% plasma cell involvement, 3 q. 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   Prior Therapy:   He status post right robotic laparoscopic partial nephrectomy on October 03, 2016.  Palliative radiation therapy to the spine.  He completed 20 Gy in 10 fractions on March 24, 2020.  Velcade and dexamethasone started on January 7 to be given weekly.  Last cycle given on 09/11/2020  Current therapy:   Revlimid 10 mg po daily for 21 days with dexamethasone 20 mg weekly started on 09/22/2020.   Zometa 4 mg every 3 months. Zometa scheduled in December 2022.   Interim History: Devin [name] returns today for a follow-up visit.  Since the last visit, he had developed some symptoms of fatigue and nausea for few days last week.  Revlimid with was held for 3 days and symptoms improved at that time.  He resumed Revlimid currently and has not reported any other issues.  He denies any nausea, excessive fatigue or bone pain.  His pain is overall manageable with morphine and oxycodone.  He denies any recent falls or syncope.    Medications: Updated on review. Current Outpatient Medications  Medication Sig Dispense Refill   acetaminophen (TYLENOL) 500 MG tablet Take 1,000 mg by mouth daily as needed for mild pain or moderate pain.     acyclovir (ZOVIRAX) 400 MG tablet Take 1 tablet (400 mg total) by mouth daily. 90 tablet 3   allopurinol (ZYLOPRIM) 300 MG tablet Take 300 mg by mouth daily.      amLODipine (NORVASC) 10 MG tablet Take 10 mg by mouth daily.     ASPIRIN LOW DOSE 81 MG EC tablet Take 81 mg by mouth daily.     Calcium  Carb-Cholecalciferol (OYSTER SHELL CALCIUM W/D) 500-200 MG-UNIT TABS TAKE 1 TABLET BY MOUTH TWICE A DAY 180 tablet 1   calcium-vitamin D (OSCAL WITH D) 500-200 MG-UNIT tablet Take 1 tablet by mouth 2 (two) times daily. 60 tablet 3   dexamethasone (DECADRON) 4 MG tablet Take 5 tablets weekly on the day of cancer treatment. 60 tablet 3   folic acid (FOLVITE) 1 MG tablet Take 1 mg by mouth daily.     furosemide (LASIX) 20 MG tablet Take 20 mg by mouth 2 (two) times daily. (Patient not taking: Reported on 08/26/2020)     labetalol (NORMODYNE) 300 MG tablet Take 300 mg by mouth 2 (two) times daily. (Patient not taking: Reported on 08/26/2020)     lidocaine (LIDODERM) 5 % Place 1 patch onto the skin every 12 (twelve) hours. Remove & Discard patch within 12 hours or as directed by MD (Patient not taking: Reported on 08/26/2020) 20 patch 0   losartan (COZAAR) 100 MG tablet Take 100 mg by mouth daily. (Patient not taking: Reported on 08/26/2020)     morphine (MS CONTIN) 30 MG 12 hr tablet Take 1 tablet (30 mg total) by mouth every 12 (twelve) hours. 60 tablet 0   morphine (MSIR) 15 MG tablet Take 1 tablet (15 mg total) by mouth every 4 (four) hours as needed for severe pain. 60 tablet 0   NARCAN 4 MG/0.1ML LIQD nasal spray kit Place 1 spray into the  nose once. (Patient not taking: Reported on 08/26/2020)     polyethylene glycol (MIRALAX / GLYCOLAX) 17 g packet Take 17 g by mouth daily as needed for moderate constipation. 14 each 0   REVLIMID 10 MG capsule TAKE 1 CAPSULE BY MOUTH EVERY DAY 21 capsule 0   rosuvastatin (CRESTOR) 5 MG tablet Take 5 mg by mouth daily.     senna-docusate (SENOKOT-S) 8.6-50 MG tablet Take 1 tablet by mouth at bedtime. 100 tablet 0   Vitamin D, Ergocalciferol, (DRISDOL) 1.25 MG (50000 UNIT) CAPS capsule Take 50,000 Units by mouth every 14 (fourteen) days.     No current facility-administered medications for this visit.     Allergies:  Allergies  Allergen Reactions   Amlodipine Swelling    Pravastatin Other (See Comments)    Joint pain   Zestril [Lisinopril] Other (See Comments)    Gout   ( Zestril) gout   Other Other (See Comments)   Oxycodone Other (See Comments)    Causes agitation       Physical Exam:      Blood pressure (!) 155/68, pulse 72, temperature 98 F (36.7 C), temperature source Oral, resp. rate 17, height _0  (1.778 m), weight 170 lb 11.2 oz (77.4 kg), SpO2 100 %.      ECOG:  1   General appearance: Comfortable appearing without any discomfort Head: Normocephalic without any trauma Oropharynx: Mucous membranes are moist and pink without any thrush or ulcers. Eyes: Pupils are equal and round reactive to light. Lymph nodes: No cervical, supraclavicular, inguinal or axillary lymphadenopathy.   Heart:regular rate and rhythm.  S1 and S2 without leg edema. Lung: Clear without any rhonchi or wheezes.  No dullness to percussion. Abdomin: Soft, nontender, nondistended with good bowel sounds.  No hepatosplenomegaly. Musculoskeletal: No joint deformity or effusion.  Full range of motion noted. Neurological: No deficits noted on motor, sensory and deep tendon reflex exam. Skin: No petechial rash or dryness.  Appeared moist.  Psychiatric: Mood and affect appeared appropriate. .           Lab Results: Lab Results  Component Value Date   WBC 3.5 (L) 12/30/2020   HGB 11.3 (L) 12/30/2020   HCT 32.8 (L) 12/30/2020   MCV 92.1 12/30/2020   PLT 140 (L) 12/30/2020     Chemistry      Component Value Date/Time   NA 136 12/07/2020 0907   K 3.7 12/07/2020 0907   CL 103 12/07/2020 0907   CO2 20 (L) 12/07/2020 0907   BUN 11 12/07/2020 0907   CREATININE 1.05 12/07/2020 0907      Component Value Date/Time   CALCIUM 8.6 (L) 12/07/2020 0907   ALKPHOS 77 12/07/2020 0907   AST 9 (L) 12/07/2020 0907   ALT 12 12/07/2020 0907   BILITOT 0.7 12/07/2020 0907      Results for Devin [name], Devin [name] (MRN [id number]) as of 12/30/2020 15:09  Ref. Range  05/22/2020 12:18 06/26/2020 13:22 07/31/2020 13:31 10/05/2020 09:23 12/07/2020 09:08  M Protein SerPl Elph-Mcnc Latest Ref Range: Not Observed g/dL 0.7 (H) 0.9 (H) 1.0 (H) 0.3 (H) 0.5 (H)  IFE 1 Unknown Comment (A) Comment (A) Comment (A) Comment (A) Comment (A)  Globulin, Total Latest Ref Range: 2.2 - 3.9 g/dL 3.5 3.2 3.7 2.5 2.7  B-Globulin SerPl Elph-Mcnc Latest Ref Range: 0.7 - 1.3 g/dL 0.8 0.8 1.0 0.7 0.7  IgG (Immunoglobin G), Serum Latest Ref Range: 603 - 1,613 mg/dL 393 (L) 380 (L) 424 (L) 350 (L)  572 (L)  IgM (Immunoglobulin M), Srm Latest Ref Range: 15 - 143 mg/dL 11 (L) 9 (L) 10 (L) 10 (L) 20  IgA Latest Ref Range: 61 - 437 mg/dL 1,521 (H) 1,306 (H) 1,287 (H) 671 (H) 613 (H)       Impression and Plan:  80 year old with:  1.  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    His disease status was updated at this time and treatment choices were reviewed.  His M spike on December 07, 2020 was slightly elevated to 0.5 g/dL but overall remains under control with the current approach.  His hematological parameters remained within normal range as well as kidney function and calcium.  Risks and benefits of continuing Revlimid at the current dose versus dose reduction versus switching to a different salvage option were reviewed.  At this time he is agreeable to continue Revlimid at the current dose and will consider treatment break intermittently during this cycle.    2.  Chronic pain: Related due to compression fracture.  Pain is manageable with morphine and oxycodone which will be refilled for him.    3.  Antiemetics: No nausea or vomiting reported at this time.  Compazine is available to him.   4.  Hypocalcemia: I recommended that continued calcium supplements at this time.  5.  VZV prophylaxis: No reactivation noted at this time.  I recommended continuing acyclovir.   6.  Bone directed therapy: He is currently on Zometa which will be repeated every 3 months.  Next  infusion will be in December.  7.  Follow-up: In 4 weeks to follow his progress.   30  minutes were dedicated to this visit.  The time spent on reviewing laboratory data,, treatment choices and future plan of care discussion.   Zola Button, MD 10/12/20223:08 PM

## 2020-12-31 LAB — KAPPA/LAMBDA LIGHT CHAINS
Kappa free light chain: 23.8 mg/L — ABNORMAL HIGH (ref 3.3–19.4)
Kappa, lambda light chain ratio: 0.09 — ABNORMAL LOW (ref 0.26–1.65)
Lambda free light chains: 277.6 mg/L — ABNORMAL HIGH (ref 5.7–26.3)

## 2021-01-01 ENCOUNTER — Other Ambulatory Visit: Payer: Self-pay | Admitting: *Deleted

## 2021-01-01 DIAGNOSIS — C649 Malignant neoplasm of unspecified kidney, except renal pelvis: Secondary | ICD-10-CM

## 2021-01-01 MED ORDER — MORPHINE SULFATE 15 MG PO TABS
15.0000 mg | ORAL_TABLET | ORAL | 0 refills | Status: DC | PRN
Start: 2021-01-01 — End: 2021-01-26

## 2021-01-02 DIAGNOSIS — N183 Chronic kidney disease, stage 3 unspecified: Secondary | ICD-10-CM | POA: Diagnosis not present

## 2021-01-02 DIAGNOSIS — C641 Malignant neoplasm of right kidney, except renal pelvis: Secondary | ICD-10-CM | POA: Diagnosis not present

## 2021-01-02 DIAGNOSIS — C9000 Multiple myeloma not having achieved remission: Secondary | ICD-10-CM | POA: Diagnosis not present

## 2021-01-02 DIAGNOSIS — C7951 Secondary malignant neoplasm of bone: Secondary | ICD-10-CM | POA: Diagnosis not present

## 2021-01-02 DIAGNOSIS — M1030 Gout due to renal impairment, unspecified site: Secondary | ICD-10-CM | POA: Diagnosis not present

## 2021-01-02 DIAGNOSIS — I712 Thoracic aortic aneurysm, without rupture, unspecified: Secondary | ICD-10-CM | POA: Diagnosis not present

## 2021-01-02 DIAGNOSIS — E1122 Type 2 diabetes mellitus with diabetic chronic kidney disease: Secondary | ICD-10-CM | POA: Diagnosis not present

## 2021-01-02 DIAGNOSIS — E7800 Pure hypercholesterolemia, unspecified: Secondary | ICD-10-CM | POA: Diagnosis not present

## 2021-01-02 DIAGNOSIS — I129 Hypertensive chronic kidney disease with stage 1 through stage 4 chronic kidney disease, or unspecified chronic kidney disease: Secondary | ICD-10-CM | POA: Diagnosis not present

## 2021-01-04 LAB — MULTIPLE MYELOMA PANEL, SERUM
Albumin SerPl Elph-Mcnc: 3.6 g/dL (ref 2.9–4.4)
Albumin/Glob SerPl: 1.1 (ref 0.7–1.7)
Alpha 1: 0.2 g/dL (ref 0.0–0.4)
Alpha2 Glob SerPl Elph-Mcnc: 0.7 g/dL (ref 0.4–1.0)
B-Globulin SerPl Elph-Mcnc: 0.7 g/dL (ref 0.7–1.3)
Gamma Glob SerPl Elph-Mcnc: 1.8 g/dL (ref 0.4–1.8)
Globulin, Total: 3.4 g/dL (ref 2.2–3.9)
IgA: 1200 mg/dL — ABNORMAL HIGH (ref 61–437)
IgG (Immunoglobin G), Serum: 588 mg/dL — ABNORMAL LOW (ref 603–1613)
IgM (Immunoglobulin M), Srm: 19 mg/dL (ref 15–143)
M Protein SerPl Elph-Mcnc: 0.9 g/dL — ABNORMAL HIGH
Total Protein ELP: 7 g/dL (ref 6.0–8.5)

## 2021-01-07 DIAGNOSIS — I712 Thoracic aortic aneurysm, without rupture, unspecified: Secondary | ICD-10-CM | POA: Diagnosis not present

## 2021-01-07 DIAGNOSIS — I129 Hypertensive chronic kidney disease with stage 1 through stage 4 chronic kidney disease, or unspecified chronic kidney disease: Secondary | ICD-10-CM | POA: Diagnosis not present

## 2021-01-07 DIAGNOSIS — C641 Malignant neoplasm of right kidney, except renal pelvis: Secondary | ICD-10-CM | POA: Diagnosis not present

## 2021-01-07 DIAGNOSIS — C9000 Multiple myeloma not having achieved remission: Secondary | ICD-10-CM | POA: Diagnosis not present

## 2021-01-07 DIAGNOSIS — M1030 Gout due to renal impairment, unspecified site: Secondary | ICD-10-CM | POA: Diagnosis not present

## 2021-01-07 DIAGNOSIS — C7951 Secondary malignant neoplasm of bone: Secondary | ICD-10-CM | POA: Diagnosis not present

## 2021-01-07 DIAGNOSIS — N183 Chronic kidney disease, stage 3 unspecified: Secondary | ICD-10-CM | POA: Diagnosis not present

## 2021-01-07 DIAGNOSIS — E1122 Type 2 diabetes mellitus with diabetic chronic kidney disease: Secondary | ICD-10-CM | POA: Diagnosis not present

## 2021-01-07 DIAGNOSIS — E7800 Pure hypercholesterolemia, unspecified: Secondary | ICD-10-CM | POA: Diagnosis not present

## 2021-01-08 DIAGNOSIS — I129 Hypertensive chronic kidney disease with stage 1 through stage 4 chronic kidney disease, or unspecified chronic kidney disease: Secondary | ICD-10-CM | POA: Diagnosis not present

## 2021-01-08 DIAGNOSIS — C7951 Secondary malignant neoplasm of bone: Secondary | ICD-10-CM | POA: Diagnosis not present

## 2021-01-08 DIAGNOSIS — E7800 Pure hypercholesterolemia, unspecified: Secondary | ICD-10-CM | POA: Diagnosis not present

## 2021-01-08 DIAGNOSIS — I712 Thoracic aortic aneurysm, without rupture, unspecified: Secondary | ICD-10-CM | POA: Diagnosis not present

## 2021-01-08 DIAGNOSIS — E1122 Type 2 diabetes mellitus with diabetic chronic kidney disease: Secondary | ICD-10-CM | POA: Diagnosis not present

## 2021-01-08 DIAGNOSIS — C641 Malignant neoplasm of right kidney, except renal pelvis: Secondary | ICD-10-CM | POA: Diagnosis not present

## 2021-01-08 DIAGNOSIS — N183 Chronic kidney disease, stage 3 unspecified: Secondary | ICD-10-CM | POA: Diagnosis not present

## 2021-01-08 DIAGNOSIS — C9000 Multiple myeloma not having achieved remission: Secondary | ICD-10-CM | POA: Diagnosis not present

## 2021-01-08 DIAGNOSIS — M1030 Gout due to renal impairment, unspecified site: Secondary | ICD-10-CM | POA: Diagnosis not present

## 2021-01-12 DIAGNOSIS — C641 Malignant neoplasm of right kidney, except renal pelvis: Secondary | ICD-10-CM | POA: Diagnosis not present

## 2021-01-12 DIAGNOSIS — I129 Hypertensive chronic kidney disease with stage 1 through stage 4 chronic kidney disease, or unspecified chronic kidney disease: Secondary | ICD-10-CM | POA: Diagnosis not present

## 2021-01-12 DIAGNOSIS — C9000 Multiple myeloma not having achieved remission: Secondary | ICD-10-CM | POA: Diagnosis not present

## 2021-01-12 DIAGNOSIS — I712 Thoracic aortic aneurysm, without rupture, unspecified: Secondary | ICD-10-CM | POA: Diagnosis not present

## 2021-01-12 DIAGNOSIS — E1122 Type 2 diabetes mellitus with diabetic chronic kidney disease: Secondary | ICD-10-CM | POA: Diagnosis not present

## 2021-01-12 DIAGNOSIS — C7951 Secondary malignant neoplasm of bone: Secondary | ICD-10-CM | POA: Diagnosis not present

## 2021-01-12 DIAGNOSIS — N183 Chronic kidney disease, stage 3 unspecified: Secondary | ICD-10-CM | POA: Diagnosis not present

## 2021-01-12 DIAGNOSIS — M1030 Gout due to renal impairment, unspecified site: Secondary | ICD-10-CM | POA: Diagnosis not present

## 2021-01-12 DIAGNOSIS — E7800 Pure hypercholesterolemia, unspecified: Secondary | ICD-10-CM | POA: Diagnosis not present

## 2021-01-13 ENCOUNTER — Ambulatory Visit: Payer: Medicare HMO | Admitting: Oncology

## 2021-01-13 ENCOUNTER — Other Ambulatory Visit: Payer: Medicare HMO

## 2021-01-14 DIAGNOSIS — M1030 Gout due to renal impairment, unspecified site: Secondary | ICD-10-CM | POA: Diagnosis not present

## 2021-01-14 DIAGNOSIS — C641 Malignant neoplasm of right kidney, except renal pelvis: Secondary | ICD-10-CM | POA: Diagnosis not present

## 2021-01-14 DIAGNOSIS — E7800 Pure hypercholesterolemia, unspecified: Secondary | ICD-10-CM | POA: Diagnosis not present

## 2021-01-14 DIAGNOSIS — I129 Hypertensive chronic kidney disease with stage 1 through stage 4 chronic kidney disease, or unspecified chronic kidney disease: Secondary | ICD-10-CM | POA: Diagnosis not present

## 2021-01-14 DIAGNOSIS — C7951 Secondary malignant neoplasm of bone: Secondary | ICD-10-CM | POA: Diagnosis not present

## 2021-01-14 DIAGNOSIS — C9000 Multiple myeloma not having achieved remission: Secondary | ICD-10-CM | POA: Diagnosis not present

## 2021-01-14 DIAGNOSIS — I712 Thoracic aortic aneurysm, without rupture, unspecified: Secondary | ICD-10-CM | POA: Diagnosis not present

## 2021-01-14 DIAGNOSIS — E1122 Type 2 diabetes mellitus with diabetic chronic kidney disease: Secondary | ICD-10-CM | POA: Diagnosis not present

## 2021-01-14 DIAGNOSIS — N183 Chronic kidney disease, stage 3 unspecified: Secondary | ICD-10-CM | POA: Diagnosis not present

## 2021-01-19 DIAGNOSIS — E7800 Pure hypercholesterolemia, unspecified: Secondary | ICD-10-CM | POA: Diagnosis not present

## 2021-01-19 DIAGNOSIS — C641 Malignant neoplasm of right kidney, except renal pelvis: Secondary | ICD-10-CM | POA: Diagnosis not present

## 2021-01-19 DIAGNOSIS — M1030 Gout due to renal impairment, unspecified site: Secondary | ICD-10-CM | POA: Diagnosis not present

## 2021-01-19 DIAGNOSIS — E1122 Type 2 diabetes mellitus with diabetic chronic kidney disease: Secondary | ICD-10-CM | POA: Diagnosis not present

## 2021-01-19 DIAGNOSIS — I129 Hypertensive chronic kidney disease with stage 1 through stage 4 chronic kidney disease, or unspecified chronic kidney disease: Secondary | ICD-10-CM | POA: Diagnosis not present

## 2021-01-19 DIAGNOSIS — I712 Thoracic aortic aneurysm, without rupture, unspecified: Secondary | ICD-10-CM | POA: Diagnosis not present

## 2021-01-19 DIAGNOSIS — N183 Chronic kidney disease, stage 3 unspecified: Secondary | ICD-10-CM | POA: Diagnosis not present

## 2021-01-19 DIAGNOSIS — C7951 Secondary malignant neoplasm of bone: Secondary | ICD-10-CM | POA: Diagnosis not present

## 2021-01-19 DIAGNOSIS — C9000 Multiple myeloma not having achieved remission: Secondary | ICD-10-CM | POA: Diagnosis not present

## 2021-01-21 ENCOUNTER — Telehealth: Payer: Self-pay | Admitting: *Deleted

## 2021-01-21 ENCOUNTER — Other Ambulatory Visit: Payer: Self-pay | Admitting: Oncology

## 2021-01-21 DIAGNOSIS — C9000 Multiple myeloma not having achieved remission: Secondary | ICD-10-CM | POA: Diagnosis not present

## 2021-01-21 DIAGNOSIS — I129 Hypertensive chronic kidney disease with stage 1 through stage 4 chronic kidney disease, or unspecified chronic kidney disease: Secondary | ICD-10-CM | POA: Diagnosis not present

## 2021-01-21 DIAGNOSIS — I712 Thoracic aortic aneurysm, without rupture, unspecified: Secondary | ICD-10-CM | POA: Diagnosis not present

## 2021-01-21 DIAGNOSIS — E7800 Pure hypercholesterolemia, unspecified: Secondary | ICD-10-CM | POA: Diagnosis not present

## 2021-01-21 DIAGNOSIS — M1030 Gout due to renal impairment, unspecified site: Secondary | ICD-10-CM | POA: Diagnosis not present

## 2021-01-21 DIAGNOSIS — C641 Malignant neoplasm of right kidney, except renal pelvis: Secondary | ICD-10-CM | POA: Diagnosis not present

## 2021-01-21 DIAGNOSIS — N183 Chronic kidney disease, stage 3 unspecified: Secondary | ICD-10-CM | POA: Diagnosis not present

## 2021-01-21 DIAGNOSIS — E1122 Type 2 diabetes mellitus with diabetic chronic kidney disease: Secondary | ICD-10-CM | POA: Diagnosis not present

## 2021-01-21 DIAGNOSIS — C7951 Secondary malignant neoplasm of bone: Secondary | ICD-10-CM | POA: Diagnosis not present

## 2021-01-21 NOTE — Telephone Encounter (Signed)
-----   Message from Devin Portela, MD sent at 01/21/2021  9:34 AM EDT ----- Yes. Stop revlimid till next MD visit ----- Message ----- From: Rolene Course, RN Sent: 01/21/2021   9:32 AM EDT To: Devin Portela, MD  [name]'s wife called this morning, she says he is having a bad week.  He continues to have significant muscle weakness which she said is getting worse, has a lot of pain, loss of appetite & diarrhea x 2 days.  She is wondering if he should stop his revlimid?  Please advise. Thanks, [name]

## 2021-01-21 NOTE — Telephone Encounter (Signed)
-----   Message from Wyatt Portela, MD sent at 01/21/2021  1:44 PM EDT ----- If it happens again, he needs to let us know ----- Message ----- From: Rolene Course, RN Sent: 01/21/2021   1:40 PM EDT To: Wyatt Portela, MD  His wife called again to say he hasn't had diarrhea today, but did have a BM this morning & noticed it is black.  She wants you to be aware.  Please advise. Thanks, [name]

## 2021-01-21 NOTE — Telephone Encounter (Signed)
PC to patient's wife, [name], informed her that Dr. Alen Blew wants to notified if the patient has any black or bloody stools again.  She verbalizes understanding.

## 2021-01-21 NOTE — Telephone Encounter (Signed)
Mrs Highfill instructed to DC patient's Revlimid until his F/U appointment next week.  Also advised her to try immodium for his diarrhea instead of pepto bismol, instructed her to call back if he continues to have 4 or more episodes of diarrhea in 24 hours.  She verbalizes understanding.

## 2021-01-25 NOTE — Telephone Encounter (Signed)
Treatment on hold per Dr Alen Blew, he will see patient first.

## 2021-01-26 ENCOUNTER — Other Ambulatory Visit: Payer: Self-pay | Admitting: Hematology

## 2021-01-26 DIAGNOSIS — C419 Malignant neoplasm of bone and articular cartilage, unspecified: Secondary | ICD-10-CM

## 2021-01-26 DIAGNOSIS — C649 Malignant neoplasm of unspecified kidney, except renal pelvis: Secondary | ICD-10-CM

## 2021-01-26 MED ORDER — MORPHINE SULFATE 15 MG PO TABS
15.0000 mg | ORAL_TABLET | ORAL | 0 refills | Status: DC | PRN
Start: 2021-01-26 — End: 2021-02-24

## 2021-01-26 MED ORDER — MORPHINE SULFATE ER 30 MG PO TBCR: 30.0000 mg | EXTENDED_RELEASE_TABLET | Freq: Two times a day (BID) | ORAL | 0 refills | Status: DC

## 2021-01-27 DIAGNOSIS — N183 Chronic kidney disease, stage 3 unspecified: Secondary | ICD-10-CM | POA: Diagnosis not present

## 2021-01-27 DIAGNOSIS — M1030 Gout due to renal impairment, unspecified site: Secondary | ICD-10-CM | POA: Diagnosis not present

## 2021-01-27 DIAGNOSIS — C7951 Secondary malignant neoplasm of bone: Secondary | ICD-10-CM | POA: Diagnosis not present

## 2021-01-27 DIAGNOSIS — C9000 Multiple myeloma not having achieved remission: Secondary | ICD-10-CM | POA: Diagnosis not present

## 2021-01-27 DIAGNOSIS — C641 Malignant neoplasm of right kidney, except renal pelvis: Secondary | ICD-10-CM | POA: Diagnosis not present

## 2021-01-27 DIAGNOSIS — I712 Thoracic aortic aneurysm, without rupture, unspecified: Secondary | ICD-10-CM | POA: Diagnosis not present

## 2021-01-27 DIAGNOSIS — E7800 Pure hypercholesterolemia, unspecified: Secondary | ICD-10-CM | POA: Diagnosis not present

## 2021-01-27 DIAGNOSIS — I129 Hypertensive chronic kidney disease with stage 1 through stage 4 chronic kidney disease, or unspecified chronic kidney disease: Secondary | ICD-10-CM | POA: Diagnosis not present

## 2021-01-27 DIAGNOSIS — E1122 Type 2 diabetes mellitus with diabetic chronic kidney disease: Secondary | ICD-10-CM | POA: Diagnosis not present

## 2021-01-28 ENCOUNTER — Inpatient Hospital Stay: Payer: Medicare HMO | Attending: Oncology

## 2021-01-28 ENCOUNTER — Other Ambulatory Visit: Payer: Self-pay

## 2021-01-28 ENCOUNTER — Inpatient Hospital Stay (HOSPITAL_BASED_OUTPATIENT_CLINIC_OR_DEPARTMENT_OTHER): Payer: Medicare HMO | Admitting: Oncology

## 2021-01-28 VITALS — BP 130/82 | HR 81 | Temp 98.1°F | Resp 17 | Ht 70.0 in | Wt 169.9 lb

## 2021-01-28 DIAGNOSIS — C419 Malignant neoplasm of bone and articular cartilage, unspecified: Secondary | ICD-10-CM

## 2021-01-28 DIAGNOSIS — C9000 Multiple myeloma not having achieved remission: Secondary | ICD-10-CM

## 2021-01-28 DIAGNOSIS — Z5112 Encounter for antineoplastic immunotherapy: Secondary | ICD-10-CM | POA: Diagnosis not present

## 2021-01-28 LAB — CBC WITH DIFFERENTIAL (CANCER CENTER ONLY)
Abs Immature Granulocytes: 0.01 10*3/uL (ref 0.00–0.07)
Basophils Absolute: 0.1 10*3/uL (ref 0.0–0.1)
Basophils Relative: 2 %
Eosinophils Absolute: 0.2 10*3/uL (ref 0.0–0.5)
Eosinophils Relative: 5 %
HCT: 33.4 % — ABNORMAL LOW (ref 39.0–52.0)
Hemoglobin: 11.5 g/dL — ABNORMAL LOW (ref 13.0–17.0)
Immature Granulocytes: 0 %
Lymphocytes Relative: 30 %
Lymphs Abs: 1 10*3/uL (ref 0.7–4.0)
MCH: 32 pg (ref 26.0–34.0)
MCHC: 34.4 g/dL (ref 30.0–36.0)
MCV: 93 fL (ref 80.0–100.0)
Monocytes Absolute: 0.6 10*3/uL (ref 0.1–1.0)
Monocytes Relative: 18 %
Neutro Abs: 1.5 10*3/uL — ABNORMAL LOW (ref 1.7–7.7)
Neutrophils Relative %: 45 %
Platelet Count: 136 10*3/uL — ABNORMAL LOW (ref 150–400)
RBC: 3.59 MIL/uL — ABNORMAL LOW (ref 4.22–5.81)
RDW: 15.7 % — ABNORMAL HIGH (ref 11.5–15.5)
WBC Count: 3.4 10*3/uL — ABNORMAL LOW (ref 4.0–10.5)
nRBC: 0 % (ref 0.0–0.2)

## 2021-01-28 LAB — CMP (CANCER CENTER ONLY)
ALT: 9 U/L (ref 0–44)
AST: 10 U/L — ABNORMAL LOW (ref 15–41)
Albumin: 3.5 g/dL (ref 3.5–5.0)
Alkaline Phosphatase: 48 U/L (ref 38–126)
Anion gap: 11 (ref 5–15)
BUN: 21 mg/dL (ref 8–23)
CO2: 21 mmol/L — ABNORMAL LOW (ref 22–32)
Calcium: 8.8 mg/dL — ABNORMAL LOW (ref 8.9–10.3)
Chloride: 100 mmol/L (ref 98–111)
Creatinine: 1.29 mg/dL — ABNORMAL HIGH (ref 0.61–1.24)
GFR, Estimated: 56 mL/min — ABNORMAL LOW (ref >60)
Glucose, Bld: 136 mg/dL — ABNORMAL HIGH (ref 70–99)
Potassium: 4.3 mmol/L (ref 3.5–5.1)
Sodium: 132 mmol/L — ABNORMAL LOW (ref 135–145)
Total Bilirubin: 0.5 mg/dL (ref 0.3–1.2)
Total Protein: 7.9 g/dL (ref 6.5–8.1)

## 2021-01-28 NOTE — Progress Notes (Signed)
Hematology and Oncology Follow Up Visit  Devin [name] [id number] [birth year] 80 y.o. 01/28/2021 2:44 PM [name], MDTimberlake, [name], *   Principle Diagnosis: 80 year old man with IgA multiple myeloma presented with 50% plasma cell involvement, 3 q. deletion, duplication 1 q, (44;31) and bone involvement in December 2021.  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   Prior Therapy:   He status post right robotic laparoscopic partial nephrectomy on October 03, 2016.  Palliative radiation therapy to the spine.  He completed 20 Gy in 10 fractions on March 24, 2020.  Velcade and dexamethasone started on January 7 to be given weekly.  Last cycle given on 09/11/2020  Current therapy:   Revlimid 10 mg po daily for 21 days with dexamethasone 20 mg weekly started on 09/22/2020.   Zometa 4 mg every 3 months.  Next Zometa will be given in December 2022.    Interim History: Devin [name] is here for repeat evaluation.  Since the last visit, he reports no acute complaints related to Revlimid.  He reports fatigue, tiredness and muscle weakness which has interfered with his quality of life.  Symptoms resolved spontaneously upon with holding Revlimid.  He reports his symptoms has improved at this time and regained most activities of daily living.  His performance status remains limited but ambulates short distances.  Pain is manageable with the current regimen.    Medications: Reviewed without changes. Current Outpatient Medications  Medication Sig Dispense Refill   acetaminophen (TYLENOL) 500 MG tablet Take 1,000 mg by mouth daily as needed for mild pain or moderate pain.     acyclovir (ZOVIRAX) 400 MG tablet Take 1 tablet (400 mg total) by mouth daily. 90 tablet 3   allopurinol (ZYLOPRIM) 300 MG tablet Take 300 mg by mouth daily.      amLODipine (NORVASC) 10 MG tablet Take 10 mg by mouth daily.     ASPIRIN LOW DOSE 81  MG EC tablet Take 81 mg by mouth daily.     Calcium Carb-Cholecalciferol (OYSTER SHELL CALCIUM W/D) 500-5 MG-MCG TABS TAKE 1 TABLET BY MOUTH TWICE A DAY 180 tablet 1   calcium-vitamin D (OSCAL WITH D) 500-200 MG-UNIT tablet Take 1 tablet by mouth 2 (two) times daily. 60 tablet 3   dexamethasone (DECADRON) 4 MG tablet Take 5 tablets weekly on the day of cancer treatment. 60 tablet 3   folic acid (FOLVITE) 1 MG tablet Take 1 mg by mouth daily.     furosemide (LASIX) 20 MG tablet Take 20 mg by mouth 2 (two) times daily. (Patient not taking: Reported on 08/26/2020)     labetalol (NORMODYNE) 300 MG tablet Take 300 mg by mouth 2 (two) times daily. (Patient not taking: Reported on 08/26/2020)     lidocaine (LIDODERM) 5 % Place 1 patch onto the skin every 12 (twelve) hours. Remove & Discard patch within 12 hours or as directed by MD (Patient not taking: Reported on 08/26/2020) 20 patch 0   losartan (COZAAR) 100 MG tablet Take 100 mg by mouth daily. (Patient not taking: Reported on 08/26/2020)     morphine (MS CONTIN) 30 MG 12 hr tablet Take 1 tablet (30 mg total) by mouth every 12 (twelve) hours. 60 tablet 0   morphine (MSIR) 15 MG tablet Take 1 tablet (15 mg total) by mouth every 4 (four) hours as needed for severe pain. 60 tablet 0   NARCAN 4 MG/0.1ML LIQD nasal spray kit Place  1 spray into the nose once. (Patient not taking: Reported on 08/26/2020)     polyethylene glycol (MIRALAX / GLYCOLAX) 17 g packet Take 17 g by mouth daily as needed for moderate constipation. 14 each 0   REVLIMID 10 MG capsule TAKE 1 CAPSULE BY MOUTH EVERY DAY 21 capsule 0   rosuvastatin (CRESTOR) 5 MG tablet Take 5 mg by mouth daily.     senna-docusate (SENOKOT-S) 8.6-50 MG tablet Take 1 tablet by mouth at bedtime. 100 tablet 0   Vitamin D, Ergocalciferol, (DRISDOL) 1.25 MG (50000 UNIT) CAPS capsule Take 50,000 Units by mouth every 14 (fourteen) days.     No current facility-administered medications for this visit.     Allergies:   Allergies  Allergen Reactions   Amlodipine Swelling   Pravastatin Other (See Comments)    Joint pain   Zestril [Lisinopril] Other (See Comments)    Gout   ( Zestril) gout   Other Other (See Comments)   Oxycodone Other (See Comments)    Causes agitation       Physical Exam:       Blood pressure 130/82, pulse 81, temperature 98.1 F (36.7 C), temperature source Temporal, resp. rate 17, height 5' 10" (1.778 m), weight 169 lb 14.4 oz (77.1 kg), SpO2 100 %.      ECOG:  1    General appearance: Alert, awake without any distress. Head: Atraumatic without abnormalities Oropharynx: Without any thrush or ulcers. Eyes: No scleral icterus. Lymph nodes: No lymphadenopathy noted in the cervical, supraclavicular, or axillary nodes Heart:regular rate and rhythm, without any murmurs or gallops.   Lung: Clear to auscultation without any rhonchi, wheezes or dullness to percussion. Abdomin: Soft, nontender without any shifting dullness or ascites. Musculoskeletal: No clubbing or cyanosis. Neurological: No motor or sensory deficits. Skin: No rashes or lesions.   .           Lab Results: Lab Results  Component Value Date   WBC 3.5 (L) 12/30/2020   HGB 11.3 (L) 12/30/2020   HCT 32.8 (L) 12/30/2020   MCV 92.1 12/30/2020   PLT 140 (L) 12/30/2020     Chemistry      Component Value Date/Time   NA 138 12/30/2020 1437   K 3.9 12/30/2020 1437   CL 105 12/30/2020 1437   CO2 24 12/30/2020 1437   BUN 13 12/30/2020 1437   CREATININE 0.99 12/30/2020 1437      Component Value Date/Time   CALCIUM 9.2 12/30/2020 1437   ALKPHOS 57 12/30/2020 1437   AST 16 12/30/2020 1437   ALT 13 12/30/2020 1437   BILITOT 0.5 12/30/2020 1437       Results for Devin [name], Devin [name] (MRN [id number]) as of 01/28/2021 14:44  Ref. Range 12/07/2020 09:08 12/30/2020 14:37  M Protein SerPl Elph-Mcnc Latest Ref Range: Not Observed g/dL 0.5 (H) 0.9 (H)  IFE 1 Unknown Comment (A) Comment (A)   Globulin, Total Latest Ref Range: 2.2 - 3.9 g/dL 2.7 3.4  B-Globulin SerPl Elph-Mcnc Latest Ref Range: 0.7 - 1.3 g/dL 0.7 0.7  IgG (Immunoglobin G), Serum Latest Ref Range: 603 - 1,613 mg/dL 572 (L) 588 (L)  IgM (Immunoglobulin M), Srm Latest Ref Range: 15 - 143 mg/dL 20 19  IgA Latest Ref Range: 61 - 437 mg/dL 613 (H) 1,200 (H)  Consists      Impression and Plan:  80 year old with:  1.  Multiple myeloma diagnosed in December 2021.  He was found to have IgA lambda multiple myeloma diagnosed  in December 2021.    Treatment choices at this time were discussed and the natural course of this disease was reiterated.  He had multiple complication associated with Revlimid and his M spike is also rising indicating disease progression.  Salvage therapy options utilizing daratumumab based regimens for the Pomalyst or Kyprolis were discussed.  After discussion today, we will proceed with Darzalex faspro in the near future.  Complications include nausea, vomiting, injection related complications, thrombosis and hemolysis among others.  He will start dexamethasone 20 mg weekly and we will potentially therapy escalate by adding additional agents pending his tolerance.    2.  Chronic pain: He is currently on morphine and oxycodone.  Pain is manageable at this time.    3.  Antiemetics: Compazine is available to him without any nausea vomiting.   4.  Hypocalcemia: Resolved at this time with calcium now within normal range.  5.  VZV prophylaxis: I recommended continuing acyclovir for the time being.   6.  Bone directed therapy: He is currently on Zometa which will be repeated in 3 months.  7.  Follow-up: We will be in the near future to start therapy.   30  minutes were spent on this encounter.  The time was dedicated to reviewing laboratory data, disease status update and complication related to cancer and cancer therapy.   Zola Button, MD 11/10/20222:44 PM

## 2021-01-28 NOTE — Progress Notes (Signed)
DISCONTINUE ON PATHWAY REGIMEN - Multiple Myeloma and Other Plasma Cell Dyscrasias     A cycle is every 21 days:     Bortezomib      Lenalidomide      Dexamethasone   **Always confirm dose/schedule in your pharmacy ordering system**  REASON: Disease Progression PRIOR TREATMENT: [id number]: VRd (Bortezomib 1.3 mg/m2 SUBQ D1, 4, 8, 11 + Lenalidomide 25 mg + Dexamethasone 20 mg) q21 Days x 8 Cycles TREATMENT RESPONSE: Partial Response (PR)  START ON PATHWAY REGIMEN - Multiple Myeloma and Other Plasma Cell Dyscrasias     Cycles 1 and 2: A cycle is every 28 days:     Daratumumab and hyaluronidase-fihj    Cycles 3 through 6: A cycle is every 28 days:     Daratumumab and hyaluronidase-fihj    Cycles 7 and beyond: A cycle is every 28 days:     Daratumumab and hyaluronidase-fihj   **Always confirm dose/schedule in your pharmacy ordering system**  Patient Characteristics: Multiple Myeloma, Relapsed / Refractory, Second through Fourth Lines of Therapy, Frail or Not a Candidate for Triplet Therapy Disease Classification: Multiple Myeloma R-ISS Staging: III Therapeutic Status: Relapsed Line of Therapy: Second Line Intent of Therapy: Non-Curative / Palliative Intent, Discussed with Patient

## 2021-01-29 LAB — KAPPA/LAMBDA LIGHT CHAINS
Kappa free light chain: 20.8 mg/L — ABNORMAL HIGH (ref 3.3–19.4)
Kappa, lambda light chain ratio: 0.06 — ABNORMAL LOW (ref 0.26–1.65)
Lambda free light chains: 366.5 mg/L — ABNORMAL HIGH (ref 5.7–26.3)

## 2021-02-01 ENCOUNTER — Telehealth: Payer: Self-pay | Admitting: *Deleted

## 2021-02-01 NOTE — Telephone Encounter (Signed)
Received call from Isabela needing to confirm that patient is no longer on Revlimid.  [phone].

## 2021-02-02 LAB — MULTIPLE MYELOMA PANEL, SERUM
Albumin SerPl Elph-Mcnc: 3.6 g/dL (ref 2.9–4.4)
Albumin/Glob SerPl: 1 (ref 0.7–1.7)
Alpha 1: 0.3 g/dL (ref 0.0–0.4)
Alpha2 Glob SerPl Elph-Mcnc: 0.7 g/dL (ref 0.4–1.0)
B-Globulin SerPl Elph-Mcnc: 0.8 g/dL (ref 0.7–1.3)
Gamma Glob SerPl Elph-Mcnc: 2.2 g/dL — ABNORMAL HIGH (ref 0.4–1.8)
Globulin, Total: 4 g/dL — ABNORMAL HIGH (ref 2.2–3.9)
IgA: 1635 mg/dL — ABNORMAL HIGH (ref 61–437)
IgG (Immunoglobin G), Serum: 523 mg/dL — ABNORMAL LOW (ref 603–1613)
IgM (Immunoglobulin M), Srm: 18 mg/dL (ref 15–143)
M Protein SerPl Elph-Mcnc: 1.9 g/dL — ABNORMAL HIGH
Total Protein ELP: 7.6 g/dL (ref 6.0–8.5)

## 2021-02-03 ENCOUNTER — Telehealth: Payer: Self-pay | Admitting: Oncology

## 2021-02-03 DIAGNOSIS — C9000 Multiple myeloma not having achieved remission: Secondary | ICD-10-CM | POA: Diagnosis not present

## 2021-02-03 DIAGNOSIS — C641 Malignant neoplasm of right kidney, except renal pelvis: Secondary | ICD-10-CM | POA: Diagnosis not present

## 2021-02-03 DIAGNOSIS — I712 Thoracic aortic aneurysm, without rupture, unspecified: Secondary | ICD-10-CM | POA: Diagnosis not present

## 2021-02-03 DIAGNOSIS — E1122 Type 2 diabetes mellitus with diabetic chronic kidney disease: Secondary | ICD-10-CM | POA: Diagnosis not present

## 2021-02-03 DIAGNOSIS — N183 Chronic kidney disease, stage 3 unspecified: Secondary | ICD-10-CM | POA: Diagnosis not present

## 2021-02-03 DIAGNOSIS — E7800 Pure hypercholesterolemia, unspecified: Secondary | ICD-10-CM | POA: Diagnosis not present

## 2021-02-03 DIAGNOSIS — I129 Hypertensive chronic kidney disease with stage 1 through stage 4 chronic kidney disease, or unspecified chronic kidney disease: Secondary | ICD-10-CM | POA: Diagnosis not present

## 2021-02-03 DIAGNOSIS — C7951 Secondary malignant neoplasm of bone: Secondary | ICD-10-CM | POA: Diagnosis not present

## 2021-02-03 DIAGNOSIS — M1030 Gout due to renal impairment, unspecified site: Secondary | ICD-10-CM | POA: Diagnosis not present

## 2021-02-03 NOTE — Telephone Encounter (Signed)
Scheduled per 11/10 los, patient has bene called and notified of upcoming appointments. Provider said it was okay for patient start on 11/30.

## 2021-02-10 DIAGNOSIS — M1030 Gout due to renal impairment, unspecified site: Secondary | ICD-10-CM | POA: Diagnosis not present

## 2021-02-10 DIAGNOSIS — I129 Hypertensive chronic kidney disease with stage 1 through stage 4 chronic kidney disease, or unspecified chronic kidney disease: Secondary | ICD-10-CM | POA: Diagnosis not present

## 2021-02-10 DIAGNOSIS — C641 Malignant neoplasm of right kidney, except renal pelvis: Secondary | ICD-10-CM | POA: Diagnosis not present

## 2021-02-10 DIAGNOSIS — C7951 Secondary malignant neoplasm of bone: Secondary | ICD-10-CM | POA: Diagnosis not present

## 2021-02-10 DIAGNOSIS — C9000 Multiple myeloma not having achieved remission: Secondary | ICD-10-CM | POA: Diagnosis not present

## 2021-02-10 DIAGNOSIS — E7800 Pure hypercholesterolemia, unspecified: Secondary | ICD-10-CM | POA: Diagnosis not present

## 2021-02-10 DIAGNOSIS — E1122 Type 2 diabetes mellitus with diabetic chronic kidney disease: Secondary | ICD-10-CM | POA: Diagnosis not present

## 2021-02-10 DIAGNOSIS — I712 Thoracic aortic aneurysm, without rupture, unspecified: Secondary | ICD-10-CM | POA: Diagnosis not present

## 2021-02-10 DIAGNOSIS — N183 Chronic kidney disease, stage 3 unspecified: Secondary | ICD-10-CM | POA: Diagnosis not present

## 2021-02-12 ENCOUNTER — Ambulatory Visit: Payer: Medicare HMO

## 2021-02-12 ENCOUNTER — Other Ambulatory Visit: Payer: Medicare HMO

## 2021-02-16 DIAGNOSIS — I129 Hypertensive chronic kidney disease with stage 1 through stage 4 chronic kidney disease, or unspecified chronic kidney disease: Secondary | ICD-10-CM | POA: Diagnosis not present

## 2021-02-16 DIAGNOSIS — C7951 Secondary malignant neoplasm of bone: Secondary | ICD-10-CM | POA: Diagnosis not present

## 2021-02-16 DIAGNOSIS — I712 Thoracic aortic aneurysm, without rupture, unspecified: Secondary | ICD-10-CM | POA: Diagnosis not present

## 2021-02-16 DIAGNOSIS — E1122 Type 2 diabetes mellitus with diabetic chronic kidney disease: Secondary | ICD-10-CM | POA: Diagnosis not present

## 2021-02-16 DIAGNOSIS — C641 Malignant neoplasm of right kidney, except renal pelvis: Secondary | ICD-10-CM | POA: Diagnosis not present

## 2021-02-16 DIAGNOSIS — M1030 Gout due to renal impairment, unspecified site: Secondary | ICD-10-CM | POA: Diagnosis not present

## 2021-02-16 DIAGNOSIS — E7800 Pure hypercholesterolemia, unspecified: Secondary | ICD-10-CM | POA: Diagnosis not present

## 2021-02-16 DIAGNOSIS — N183 Chronic kidney disease, stage 3 unspecified: Secondary | ICD-10-CM | POA: Diagnosis not present

## 2021-02-16 DIAGNOSIS — C9000 Multiple myeloma not having achieved remission: Secondary | ICD-10-CM | POA: Diagnosis not present

## 2021-02-17 ENCOUNTER — Inpatient Hospital Stay: Payer: Medicare HMO

## 2021-02-17 ENCOUNTER — Other Ambulatory Visit: Payer: Self-pay

## 2021-02-17 VITALS — BP 160/97 | HR 103 | Temp 98.5°F | Resp 18 | Wt 170.4 lb

## 2021-02-17 DIAGNOSIS — C9000 Multiple myeloma not having achieved remission: Secondary | ICD-10-CM

## 2021-02-17 DIAGNOSIS — M0609 Rheumatoid arthritis without rheumatoid factor, multiple sites: Secondary | ICD-10-CM | POA: Diagnosis not present

## 2021-02-17 DIAGNOSIS — I2584 Coronary atherosclerosis due to calcified coronary lesion: Secondary | ICD-10-CM | POA: Diagnosis not present

## 2021-02-17 DIAGNOSIS — I10 Essential (primary) hypertension: Secondary | ICD-10-CM | POA: Diagnosis not present

## 2021-02-17 DIAGNOSIS — N183 Chronic kidney disease, stage 3 unspecified: Secondary | ICD-10-CM | POA: Diagnosis not present

## 2021-02-17 DIAGNOSIS — I129 Hypertensive chronic kidney disease with stage 1 through stage 4 chronic kidney disease, or unspecified chronic kidney disease: Secondary | ICD-10-CM | POA: Diagnosis not present

## 2021-02-17 DIAGNOSIS — Z5112 Encounter for antineoplastic immunotherapy: Secondary | ICD-10-CM | POA: Diagnosis not present

## 2021-02-17 DIAGNOSIS — E119 Type 2 diabetes mellitus without complications: Secondary | ICD-10-CM | POA: Diagnosis not present

## 2021-02-17 DIAGNOSIS — I2510 Atherosclerotic heart disease of native coronary artery without angina pectoris: Secondary | ICD-10-CM | POA: Diagnosis not present

## 2021-02-17 DIAGNOSIS — E7800 Pure hypercholesterolemia, unspecified: Secondary | ICD-10-CM | POA: Diagnosis not present

## 2021-02-17 DIAGNOSIS — D509 Iron deficiency anemia, unspecified: Secondary | ICD-10-CM | POA: Diagnosis not present

## 2021-02-17 LAB — CBC WITH DIFFERENTIAL (CANCER CENTER ONLY)
Abs Immature Granulocytes: 0.02 10*3/uL (ref 0.00–0.07)
Basophils Absolute: 0 10*3/uL (ref 0.0–0.1)
Basophils Relative: 0 %
Eosinophils Absolute: 0.1 10*3/uL (ref 0.0–0.5)
Eosinophils Relative: 2 %
HCT: 32.3 % — ABNORMAL LOW (ref 39.0–52.0)
Hemoglobin: 11.3 g/dL — ABNORMAL LOW (ref 13.0–17.0)
Immature Granulocytes: 0 %
Lymphocytes Relative: 19 %
Lymphs Abs: 0.9 10*3/uL (ref 0.7–4.0)
MCH: 32.4 pg (ref 26.0–34.0)
MCHC: 35 g/dL (ref 30.0–36.0)
MCV: 92.6 fL (ref 80.0–100.0)
Monocytes Absolute: 0.1 10*3/uL (ref 0.1–1.0)
Monocytes Relative: 3 %
Neutro Abs: 3.5 10*3/uL (ref 1.7–7.7)
Neutrophils Relative %: 76 %
Platelet Count: 102 10*3/uL — ABNORMAL LOW (ref 150–400)
RBC: 3.49 MIL/uL — ABNORMAL LOW (ref 4.22–5.81)
RDW: 15.9 % — ABNORMAL HIGH (ref 11.5–15.5)
WBC Count: 4.6 10*3/uL (ref 4.0–10.5)
nRBC: 0 % (ref 0.0–0.2)

## 2021-02-17 LAB — TYPE AND SCREEN
ABO/RH(D): AB NEG
Antibody Screen: NEGATIVE

## 2021-02-17 LAB — CMP (CANCER CENTER ONLY)
ALT: 10 U/L (ref 0–44)
AST: 13 U/L — ABNORMAL LOW (ref 15–41)
Albumin: 3.5 g/dL (ref 3.5–5.0)
Alkaline Phosphatase: 42 U/L (ref 38–126)
Anion gap: 14 (ref 5–15)
BUN: 15 mg/dL (ref 8–23)
CO2: 20 mmol/L — ABNORMAL LOW (ref 22–32)
Calcium: 9.2 mg/dL (ref 8.9–10.3)
Chloride: 100 mmol/L (ref 98–111)
Creatinine: 1.2 mg/dL (ref 0.61–1.24)
GFR, Estimated: >60 mL/min (ref >60)
Glucose, Bld: 137 mg/dL — ABNORMAL HIGH (ref 70–99)
Potassium: 4 mmol/L (ref 3.5–5.1)
Sodium: 134 mmol/L — ABNORMAL LOW (ref 135–145)
Total Bilirubin: 0.5 mg/dL (ref 0.3–1.2)
Total Protein: 10.4 g/dL — ABNORMAL HIGH (ref 6.5–8.1)

## 2021-02-17 MED ORDER — MONTELUKAST SODIUM 10 MG PO TABS
10.0000 mg | ORAL_TABLET | Freq: Once | ORAL | Status: AC
  Administered 2021-02-17: 10 mg via ORAL
  Filled 2021-02-17: qty 1

## 2021-02-17 MED ORDER — DIPHENHYDRAMINE HCL 25 MG PO CAPS
50.0000 mg | ORAL_CAPSULE | Freq: Once | ORAL | Status: AC
  Administered 2021-02-17: 50 mg via ORAL
  Filled 2021-02-17: qty 2

## 2021-02-17 MED ORDER — ACETAMINOPHEN 325 MG PO TABS
650.0000 mg | ORAL_TABLET | Freq: Once | ORAL | Status: AC
  Administered 2021-02-17: 650 mg via ORAL
  Filled 2021-02-17: qty 2

## 2021-02-17 MED ORDER — DARATUMUMAB-HYALURONIDASE-FIHJ 1800-30000 MG-UT/15ML ~~LOC~~ SOLN
1800.0000 mg | Freq: Once | SUBCUTANEOUS | Status: AC
  Administered 2021-02-17: 1800 mg via SUBCUTANEOUS
  Filled 2021-02-17: qty 15

## 2021-02-17 MED ORDER — DEXAMETHASONE 4 MG PO TABS: 20.0000 mg | ORAL_TABLET | Freq: Once | ORAL | Status: DC

## 2021-02-17 NOTE — Progress Notes (Signed)
Pt stayed for 2hr observation post darzalex injection. Pt tolerated well, declines any complaints. VSS, pt discharged in stable condition to lobby via WC. Pt had no further questions at this time, agreeable to call with any question so concerns.

## 2021-02-17 NOTE — Patient Instructions (Signed)
[facility]  Discharge Instructions: Thank you for choosing [facility] to provide your oncology and hematology care.   If you have a lab appointment with the [facility], please go directly to the [facility] and check in at the registration area.   Wear comfortable clothing and clothing appropriate for easy access to any Portacath or PICC line.   We strive to give you quality time with your provider. You may need to reschedule your appointment if you arrive late (15 or more minutes).  Arriving late affects you and other patients whose appointments are after yours.  Also, if you miss three or more appointments without notifying the office, you may be dismissed from the clinic at the provider's discretion.      For prescription refill requests, have your pharmacy contact our office and allow 72 hours for refills to be completed.    Today you received the following chemotherapy and/or immunotherapy agents: Darzalex faspro      To help prevent nausea and vomiting after your treatment, we encourage you to take your nausea medication as directed.  BELOW ARE SYMPTOMS THAT SHOULD BE REPORTED IMMEDIATELY: *FEVER GREATER THAN 100.4 F (38 C) OR HIGHER *CHILLS OR SWEATING *NAUSEA AND VOMITING THAT IS NOT CONTROLLED WITH YOUR NAUSEA MEDICATION *UNUSUAL SHORTNESS OF BREATH *UNUSUAL BRUISING OR BLEEDING *URINARY PROBLEMS (pain or burning when urinating, or frequent urination) *BOWEL PROBLEMS (unusual diarrhea, constipation, pain near the anus) TENDERNESS IN MOUTH AND THROAT WITH OR WITHOUT PRESENCE OF ULCERS (sore throat, sores in mouth, or a toothache) UNUSUAL RASH, SWELLING OR PAIN  UNUSUAL VAGINAL DISCHARGE OR ITCHING   Items with * indicate a potential emergency and should be followed up as soon as possible or go to the Emergency Department if any problems should occur.  Please show the CHEMOTHERAPY ALERT CARD or IMMUNOTHERAPY ALERT CARD at  check-in to the Emergency Department and triage nurse.  Should you have questions after your visit or need to cancel or reschedule your appointment, please contact [facility]  Dept: [phone]  and follow the prompts.  Office hours are 8:00 a.m. to 4:30 p.m. Monday - Friday. Please note that voicemails left after 4:00 p.m. may not be returned until the following business day.  We are closed weekends and major holidays. You have access to a nurse at all times for urgent questions. Please call the main number to the clinic Dept: [phone] and follow the prompts.   For any non-urgent questions, you may also contact your provider using MyChart. We now offer e-Visits for anyone 80 and older to request care online for non-urgent symptoms. For details visit [email or web address].   Also download the MyChart app! Go to the app store, search "MyChart", open the app, select [facility], and log in with your MyChart username and password.  Due to Covid, a mask is required upon entering the hospital/clinic. If you do not have a mask, one will be given to you upon arrival. For doctor visits, patients may have 1 support person aged 62 or older with them. For treatment visits, patients cannot have anyone with them due to current Covid guidelines and our immunocompromised population.   Daratumumab; Hyaluronidase Injection What is this medication? DARATUMUMAB; HYALURONIDASE (dar a toom ue mab / hye al ur ON i dase) is a monoclonal antibody. Hyaluronidase is used to improve the effects of daratumumab. It treats certain types of cancer. Some of the cancers treated are multiple myeloma  and light-chain amyloidosis. This medicine may be used for other purposes; ask your health care provider or pharmacist if you have questions. COMMON BRAND NAME(S): DARZALEX FASPRO What should I tell my care team before I take this medication? They need to know if you have any of these  conditions: heart disease infection especially a viral infection such as chickenpox, cold sores, herpes, or hepatitis B lung or breathing disease an unusual or allergic reaction to daratumumab, hyaluronidase, other medicines, foods, dyes, or preservatives pregnant or trying to get pregnant breast-feeding How should I use this medication? This medicine is for injection under the skin. It is given by a health care professional in a hospital or clinic setting. Talk to your pediatrician regarding the use of this medicine in children. Special care may be needed. Overdosage: If you think you have taken too much of this medicine contact a poison control center or emergency room at once. NOTE: This medicine is only for you. Do not share this medicine with others. What if I miss a dose? Keep appointments for follow-up doses as directed. It is important not to miss your dose. Call your doctor or health care professional if you are unable to keep an appointment. What may interact with this medication? Interactions have not been studied. This list may not describe all possible interactions. Give your health care provider a list of all the medicines, herbs, non-prescription drugs, or dietary supplements you use. Also tell them if you smoke, drink alcohol, or use illegal drugs. Some items may interact with your medicine. What should I watch for while using this medication? Your condition will be monitored carefully while you are receiving this medicine. This medicine can cause serious allergic reactions. To reduce your risk, your health care provider may give you other medicine to take before receiving this one. Be sure to follow the directions from your health care provider. This medicine can affect the results of blood tests to match your blood type. These changes can last for up to 6 months after the final dose. Your healthcare provider will do blood tests to match your blood type before you start  treatment. Tell all of your healthcare providers that you are being treated with this medicine before receiving a blood transfusion. This medicine can affect the results of some tests used to determine treatment response; extra tests may be needed to evaluate response. Do not become pregnant while taking this medicine or for 3 months after stopping it. Women should inform their health care provider if they wish to become pregnant or think they might be pregnant. There is a potential for serious side effects to an unborn child. Talk to your health care provider for more information. Do not breast-feed an infant while taking this medicine. What side effects may I notice from receiving this medication? Side effects that you should report to your care team as soon as possible: Allergic reactions--skin rash, itching or hives, swelling of the face, lips, or tongue Blood clot--chest pain, shortness of breath, pain, swelling or warmth in the leg Blurred vision Fast, irregular heartbeat Infection--fever, chills, cough, sore throat, pain or trouble passing urine Injection reactions--dizziness, fast heartbeat, feeling faint or lightheaded, falls, headache, increase in blood pressure, nausea, vomiting, or wheezing or trouble breathing with loud or whistling sounds Low red blood cell counts--trouble breathing, feeling faint, lightheaded or falls, unusually weak or tired Unusual bleeding or bruising Side effects that usually do not require medical attention (report these to your care team if they  continue or are bothersome): Back pain Constipation Diarrhea Pain, tingling, numbness in the hands or feet Pain, redness, or irritation at site where injected Muscle cramp or pain Swelling of the ankles, feet, hands Tiredness Trouble sleeping This list may not describe all possible side effects. Call your doctor for medical advice about side effects. You may report side effects to FDA at 1-800-FDA-1088. Where  should I keep my medication? This drug is given in a hospital or clinic and will not be stored at home. NOTE: This sheet is a summary. It may not cover all possible information. If you have questions about this medicine, talk to your doctor, pharmacist, or health care provider.  2022 Elsevier/Gold Standard (2020-11-24 00:00:00)

## 2021-02-17 NOTE — Progress Notes (Addendum)
Patient took Dex 20mg  at 10am today. Dex 20mg  premedications will not be given today. Pretreatment RBC phenotype ordered and will be drawn today by lab today prior to [place] administration.  Raul [place], [place], BCPS, BCOP 02/17/2021 2:31 PM

## 2021-02-18 LAB — PRETREATMENT RBC PHENOTYPE: DAT, IgG: NEGATIVE

## 2021-02-24 ENCOUNTER — Inpatient Hospital Stay: Payer: Medicare HMO | Attending: Oncology

## 2021-02-24 ENCOUNTER — Inpatient Hospital Stay: Payer: Medicare HMO

## 2021-02-24 ENCOUNTER — Other Ambulatory Visit: Payer: Self-pay

## 2021-02-24 ENCOUNTER — Other Ambulatory Visit: Payer: Self-pay | Admitting: *Deleted

## 2021-02-24 VITALS — BP 149/97 | HR 95 | Temp 98.9°F | Resp 18 | Wt 169.5 lb

## 2021-02-24 DIAGNOSIS — Z5112 Encounter for antineoplastic immunotherapy: Secondary | ICD-10-CM | POA: Diagnosis not present

## 2021-02-24 DIAGNOSIS — C649 Malignant neoplasm of unspecified kidney, except renal pelvis: Secondary | ICD-10-CM

## 2021-02-24 DIAGNOSIS — C9000 Multiple myeloma not having achieved remission: Secondary | ICD-10-CM | POA: Insufficient documentation

## 2021-02-24 DIAGNOSIS — C419 Malignant neoplasm of bone and articular cartilage, unspecified: Secondary | ICD-10-CM

## 2021-02-24 LAB — CMP (CANCER CENTER ONLY)
ALT: 13 U/L (ref 0–44)
AST: 10 U/L — ABNORMAL LOW (ref 15–41)
Albumin: 3.6 g/dL (ref 3.5–5.0)
Alkaline Phosphatase: 42 U/L (ref 38–126)
Anion gap: 15 (ref 5–15)
BUN: 19 mg/dL (ref 8–23)
CO2: 18 mmol/L — ABNORMAL LOW (ref 22–32)
Calcium: 9.3 mg/dL (ref 8.9–10.3)
Chloride: 101 mmol/L (ref 98–111)
Creatinine: 1.32 mg/dL — ABNORMAL HIGH (ref 0.61–1.24)
GFR, Estimated: 55 mL/min — ABNORMAL LOW (ref >60)
Glucose, Bld: 181 mg/dL — ABNORMAL HIGH (ref 70–99)
Potassium: 4.3 mmol/L (ref 3.5–5.1)
Sodium: 134 mmol/L — ABNORMAL LOW (ref 135–145)
Total Bilirubin: 0.5 mg/dL (ref 0.3–1.2)
Total Protein: 10.5 g/dL — ABNORMAL HIGH (ref 6.5–8.1)

## 2021-02-24 LAB — CBC WITH DIFFERENTIAL (CANCER CENTER ONLY)
Abs Immature Granulocytes: 0.01 10*3/uL (ref 0.00–0.07)
Basophils Absolute: 0 10*3/uL (ref 0.0–0.1)
Basophils Relative: 0 %
Eosinophils Absolute: 0 10*3/uL (ref 0.0–0.5)
Eosinophils Relative: 1 %
HCT: 34 % — ABNORMAL LOW (ref 39.0–52.0)
Hemoglobin: 11.8 g/dL — ABNORMAL LOW (ref 13.0–17.0)
Immature Granulocytes: 0 %
Lymphocytes Relative: 11 %
Lymphs Abs: 0.5 10*3/uL — ABNORMAL LOW (ref 0.7–4.0)
MCH: 32.2 pg (ref 26.0–34.0)
MCHC: 34.7 g/dL (ref 30.0–36.0)
MCV: 92.9 fL (ref 80.0–100.0)
Monocytes Absolute: 0.1 10*3/uL (ref 0.1–1.0)
Monocytes Relative: 1 %
Neutro Abs: 3.6 10*3/uL (ref 1.7–7.7)
Neutrophils Relative %: 87 %
Platelet Count: 99 10*3/uL — ABNORMAL LOW (ref 150–400)
RBC: 3.66 MIL/uL — ABNORMAL LOW (ref 4.22–5.81)
RDW: 16.1 % — ABNORMAL HIGH (ref 11.5–15.5)
WBC Count: 4.2 10*3/uL (ref 4.0–10.5)
nRBC: 0 % (ref 0.0–0.2)

## 2021-02-24 MED ORDER — MONTELUKAST SODIUM 10 MG PO TABS
10.0000 mg | ORAL_TABLET | Freq: Once | ORAL | Status: AC
  Administered 2021-02-24: 10 mg via ORAL
  Filled 2021-02-24: qty 1

## 2021-02-24 MED ORDER — ACETAMINOPHEN 325 MG PO TABS
650.0000 mg | ORAL_TABLET | Freq: Once | ORAL | Status: AC
  Administered 2021-02-24: 650 mg via ORAL
  Filled 2021-02-24: qty 2

## 2021-02-24 MED ORDER — MORPHINE SULFATE ER 30 MG PO TBCR: 30.0000 mg | EXTENDED_RELEASE_TABLET | Freq: Two times a day (BID) | ORAL | 0 refills | Status: DC

## 2021-02-24 MED ORDER — DEXAMETHASONE 4 MG PO TABS
20.0000 mg | ORAL_TABLET | Freq: Once | ORAL | Status: DC
  Filled 2021-02-24: qty 5

## 2021-02-24 MED ORDER — MORPHINE SULFATE 15 MG PO TABS: 15.0000 mg | ORAL_TABLET | ORAL | 0 refills | Status: DC | PRN

## 2021-02-24 MED ORDER — DIPHENHYDRAMINE HCL 25 MG PO CAPS
50.0000 mg | ORAL_CAPSULE | Freq: Once | ORAL | Status: AC
  Administered 2021-02-24: 50 mg via ORAL
  Filled 2021-02-24: qty 2

## 2021-02-24 MED ORDER — DARATUMUMAB-HYALURONIDASE-FIHJ 1800-30000 MG-UT/15ML ~~LOC~~ SOLN
1800.0000 mg | Freq: Once | SUBCUTANEOUS | Status: AC
  Administered 2021-02-24: 1800 mg via SUBCUTANEOUS
  Filled 2021-02-24: qty 15

## 2021-02-24 NOTE — Patient Instructions (Signed)
[facility]  Discharge Instructions: Thank you for choosing [facility] to provide your oncology and hematology care.   If you have a lab appointment with the [facility], please go directly to the [facility] and check in at the registration area.   Wear comfortable clothing and clothing appropriate for easy access to any Portacath or PICC line.   We strive to give you quality time with your provider. You may need to reschedule your appointment if you arrive late (15 or more minutes).  Arriving late affects you and other patients whose appointments are after yours.  Also, if you miss three or more appointments without notifying the office, you may be dismissed from the clinic at the provider's discretion.      For prescription refill requests, have your pharmacy contact our office and allow 72 hours for refills to be completed.    Today you received the following chemotherapy and/or immunotherapy agents drazalex       To help prevent nausea and vomiting after your treatment, we encourage you to take your nausea medication as directed.  BELOW ARE SYMPTOMS THAT SHOULD BE REPORTED IMMEDIATELY: *FEVER GREATER THAN 100.4 F (38 C) OR HIGHER *CHILLS OR SWEATING *NAUSEA AND VOMITING THAT IS NOT CONTROLLED WITH YOUR NAUSEA MEDICATION *UNUSUAL SHORTNESS OF BREATH *UNUSUAL BRUISING OR BLEEDING *URINARY PROBLEMS (pain or burning when urinating, or frequent urination) *BOWEL PROBLEMS (unusual diarrhea, constipation, pain near the anus) TENDERNESS IN MOUTH AND THROAT WITH OR WITHOUT PRESENCE OF ULCERS (sore throat, sores in mouth, or a toothache) UNUSUAL RASH, SWELLING OR PAIN  UNUSUAL VAGINAL DISCHARGE OR ITCHING   Items with * indicate a potential emergency and should be followed up as soon as possible or go to the Emergency Department if any problems should occur.  Please show the CHEMOTHERAPY ALERT CARD or IMMUNOTHERAPY ALERT CARD at check-in to  the Emergency Department and triage nurse.  Should you have questions after your visit or need to cancel or reschedule your appointment, please contact [facility]  Dept: [phone]  and follow the prompts.  Office hours are 8:00 a.m. to 4:30 p.m. Monday - Friday. Please note that voicemails left after 4:00 p.m. may not be returned until the following business day.  We are closed weekends and major holidays. You have access to a nurse at all times for urgent questions. Please call the main number to the clinic Dept: [phone] and follow the prompts.   For any non-urgent questions, you may also contact your provider using MyChart. We now offer e-Visits for anyone 72 and older to request care online for non-urgent symptoms. For details visit [email or web address].   Also download the MyChart app! Go to the app store, search "MyChart", open the app, select [facility], and log in with your MyChart username and password.  Due to Covid, a mask is required upon entering the hospital/clinic. If you do not have a mask, one will be given to you upon arrival. For doctor visits, patients may have 1 support person aged 26 or older with them. For treatment visits, patients cannot have anyone with them due to current Covid guidelines and our immunocompromised population.

## 2021-02-24 NOTE — Progress Notes (Signed)
Ok to treat today D8/C1 Darzalex faspro w/ platelet 99 per Dr. Alen Blew

## 2021-02-25 LAB — KAPPA/LAMBDA LIGHT CHAINS
Kappa free light chain: 4.7 mg/L (ref 3.3–19.4)
Kappa, lambda light chain ratio: 0.01 — ABNORMAL LOW (ref 0.26–1.65)
Lambda free light chains: 386.8 mg/L — ABNORMAL HIGH (ref 5.7–26.3)

## 2021-03-01 LAB — MULTIPLE MYELOMA PANEL, SERUM
Albumin SerPl Elph-Mcnc: 4 g/dL (ref 2.9–4.4)
Albumin/Glob SerPl: 0.7 (ref 0.7–1.7)
Alpha 1: 0.3 g/dL (ref 0.0–0.4)
Alpha2 Glob SerPl Elph-Mcnc: 0.8 g/dL (ref 0.4–1.0)
B-Globulin SerPl Elph-Mcnc: 0.8 g/dL (ref 0.7–1.3)
Gamma Glob SerPl Elph-Mcnc: 4 g/dL — ABNORMAL HIGH (ref 0.4–1.8)
Globulin, Total: 6 g/dL — ABNORMAL HIGH (ref 2.2–3.9)
IgA: 3121 mg/dL — ABNORMAL HIGH (ref 61–437)
IgG (Immunoglobin G), Serum: 509 mg/dL — ABNORMAL LOW (ref 603–1613)
IgM (Immunoglobulin M), Srm: 8 mg/dL — ABNORMAL LOW (ref 15–143)
M Protein SerPl Elph-Mcnc: 3 g/dL — ABNORMAL HIGH
Total Protein ELP: 10 g/dL — ABNORMAL HIGH (ref 6.0–8.5)

## 2021-03-03 ENCOUNTER — Inpatient Hospital Stay: Payer: Medicare HMO

## 2021-03-03 ENCOUNTER — Other Ambulatory Visit: Payer: Self-pay

## 2021-03-03 VITALS — BP 143/91 | HR 91 | Temp 97.6°F | Resp 17 | Wt 169.5 lb

## 2021-03-03 DIAGNOSIS — C9000 Multiple myeloma not having achieved remission: Secondary | ICD-10-CM

## 2021-03-03 DIAGNOSIS — C419 Malignant neoplasm of bone and articular cartilage, unspecified: Secondary | ICD-10-CM

## 2021-03-03 DIAGNOSIS — Z5112 Encounter for antineoplastic immunotherapy: Secondary | ICD-10-CM | POA: Diagnosis not present

## 2021-03-03 LAB — CBC WITH DIFFERENTIAL (CANCER CENTER ONLY)
Abs Immature Granulocytes: 0.02 10*3/uL (ref 0.00–0.07)
Basophils Absolute: 0 10*3/uL (ref 0.0–0.1)
Basophils Relative: 0 %
Eosinophils Absolute: 0 10*3/uL (ref 0.0–0.5)
Eosinophils Relative: 0 %
HCT: 30.4 % — ABNORMAL LOW (ref 39.0–52.0)
Hemoglobin: 10.6 g/dL — ABNORMAL LOW (ref 13.0–17.0)
Immature Granulocytes: 1 %
Lymphocytes Relative: 6 %
Lymphs Abs: 0.2 10*3/uL — ABNORMAL LOW (ref 0.7–4.0)
MCH: 32.8 pg (ref 26.0–34.0)
MCHC: 34.9 g/dL (ref 30.0–36.0)
MCV: 94.1 fL (ref 80.0–100.0)
Monocytes Absolute: 0 10*3/uL — ABNORMAL LOW (ref 0.1–1.0)
Monocytes Relative: 1 %
Neutro Abs: 3.8 10*3/uL (ref 1.7–7.7)
Neutrophils Relative %: 92 %
Platelet Count: 94 10*3/uL — ABNORMAL LOW (ref 150–400)
RBC: 3.23 MIL/uL — ABNORMAL LOW (ref 4.22–5.81)
RDW: 16.7 % — ABNORMAL HIGH (ref 11.5–15.5)
WBC Count: 4.1 10*3/uL (ref 4.0–10.5)
nRBC: 0 % (ref 0.0–0.2)

## 2021-03-03 LAB — CMP (CANCER CENTER ONLY)
ALT: 10 U/L (ref 0–44)
AST: 8 U/L — ABNORMAL LOW (ref 15–41)
Albumin: 3.4 g/dL — ABNORMAL LOW (ref 3.5–5.0)
Alkaline Phosphatase: 43 U/L (ref 38–126)
Anion gap: 13 (ref 5–15)
BUN: 20 mg/dL (ref 8–23)
CO2: 19 mmol/L — ABNORMAL LOW (ref 22–32)
Calcium: 9.1 mg/dL (ref 8.9–10.3)
Chloride: 98 mmol/L (ref 98–111)
Creatinine: 1.31 mg/dL — ABNORMAL HIGH (ref 0.61–1.24)
GFR, Estimated: 55 mL/min — ABNORMAL LOW (ref >60)
Glucose, Bld: 209 mg/dL — ABNORMAL HIGH (ref 70–99)
Potassium: 4.2 mmol/L (ref 3.5–5.1)
Sodium: 130 mmol/L — ABNORMAL LOW (ref 135–145)
Total Bilirubin: 0.6 mg/dL (ref 0.3–1.2)
Total Protein: 9.6 g/dL — ABNORMAL HIGH (ref 6.5–8.1)

## 2021-03-03 MED ORDER — SODIUM CHLORIDE 0.9 % IV SOLN
Freq: Once | INTRAVENOUS | Status: AC
Start: 2021-03-03 — End: 2021-03-03

## 2021-03-03 MED ORDER — ACETAMINOPHEN 325 MG PO TABS
650.0000 mg | ORAL_TABLET | Freq: Once | ORAL | Status: AC
  Administered 2021-03-03: 14:00:00 650 mg via ORAL
  Filled 2021-03-03: qty 2

## 2021-03-03 MED ORDER — DEXAMETHASONE 4 MG PO TABS
20.0000 mg | ORAL_TABLET | Freq: Once | ORAL | Status: DC
  Filled 2021-03-03: qty 5

## 2021-03-03 MED ORDER — DARATUMUMAB-HYALURONIDASE-FIHJ 1800-30000 MG-UT/15ML ~~LOC~~ SOLN
1800.0000 mg | Freq: Once | SUBCUTANEOUS | Status: AC
  Administered 2021-03-03: 15:00:00 1800 mg via SUBCUTANEOUS
  Filled 2021-03-03: qty 15

## 2021-03-03 MED ORDER — MONTELUKAST SODIUM 10 MG PO TABS
10.0000 mg | ORAL_TABLET | Freq: Once | ORAL | Status: AC
  Administered 2021-03-03: 14:00:00 10 mg via ORAL
  Filled 2021-03-03: qty 1

## 2021-03-03 MED ORDER — ZOLEDRONIC ACID 4 MG/100ML IV SOLN
4.0000 mg | Freq: Once | INTRAVENOUS | Status: AC
  Administered 2021-03-03: 15:00:00 4 mg via INTRAVENOUS
  Filled 2021-03-03: qty 100

## 2021-03-03 MED ORDER — DIPHENHYDRAMINE HCL 25 MG PO CAPS
50.0000 mg | ORAL_CAPSULE | Freq: Once | ORAL | Status: AC
  Administered 2021-03-03: 14:00:00 50 mg via ORAL
  Filled 2021-03-03: qty 2

## 2021-03-03 NOTE — Patient Instructions (Signed)
[facility]  Discharge Instructions: Thank you for choosing [facility] to provide your oncology and hematology care.   If you have a lab appointment with the [facility], please go directly to the [facility] and check in at the registration area.   Wear comfortable clothing and clothing appropriate for easy access to any Portacath or PICC line.   We strive to give you quality time with your provider. You may need to reschedule your appointment if you arrive late (15 or more minutes).  Arriving late affects you and other patients whose appointments are after yours.  Also, if you miss three or more appointments without notifying the office, you may be dismissed from the clinic at the providers discretion.      For prescription refill requests, have your pharmacy contact our office and allow 72 hours for refills to be completed.    Today you received the following chemotherapy and/or immunotherapy agents: Darzalex Faspro   To help prevent nausea and vomiting after your treatment, we encourage you to take your nausea medication as directed.  BELOW ARE SYMPTOMS THAT SHOULD BE REPORTED IMMEDIATELY: *FEVER GREATER THAN 100.4 F (38 C) OR HIGHER *CHILLS OR SWEATING *NAUSEA AND VOMITING THAT IS NOT CONTROLLED WITH YOUR NAUSEA MEDICATION *UNUSUAL SHORTNESS OF BREATH *UNUSUAL BRUISING OR BLEEDING *URINARY PROBLEMS (pain or burning when urinating, or frequent urination) *BOWEL PROBLEMS (unusual diarrhea, constipation, pain near the anus) TENDERNESS IN MOUTH AND THROAT WITH OR WITHOUT PRESENCE OF ULCERS (sore throat, sores in mouth, or a toothache) UNUSUAL RASH, SWELLING OR PAIN  UNUSUAL VAGINAL DISCHARGE OR ITCHING   Items with * indicate a potential emergency and should be followed up as soon as possible or go to the Emergency Department if any problems should occur.  Please show the CHEMOTHERAPY ALERT CARD or IMMUNOTHERAPY ALERT CARD at check-in  to the Emergency Department and triage nurse.  Should you have questions after your visit or need to cancel or reschedule your appointment, please contact [facility]  Dept: [phone]  and follow the prompts.  Office hours are 8:00 a.m. to 4:30 p.m. Monday - Friday. Please note that voicemails left after 4:00 p.m. may not be returned until the following business day.  We are closed weekends and major holidays. You have access to a nurse at all times for urgent questions. Please call the main number to the clinic Dept: [phone] and follow the prompts.   For any non-urgent questions, you may also contact your provider using MyChart. We now offer e-Visits for anyone 60 and older to request care online for non-urgent symptoms. For details visit [email or web address].   Also download the MyChart app! Go to the app store, search "MyChart", open the app, select [facility], and log in with your MyChart username and password.  Due to Covid, a mask is required upon entering the hospital/clinic. If you do not have a mask, one will be given to you upon arrival. For doctor visits, patients may have 1 support person aged 3 or older with them. For treatment visits, patients cannot have anyone with them due to current Covid guidelines and our immunocompromised population.   Zoledronic Acid Injection (Hypercalcemia, Oncology) What is this medication? ZOLEDRONIC ACID (ZOE le dron ik AS id) slows calcium loss from bones. It high calcium levels in the blood from some kinds of cancer. It may be used in other people at risk for bone loss. This medicine may be used  for other purposes; ask your health care provider or pharmacist if you have questions. COMMON BRAND NAME(S): Zometa What should I tell my care team before I take this medication? They need to know if you have any of these conditions: cancer dehydration dental disease kidney disease liver disease low levels of  calcium in the blood lung or breathing disease (asthma) receiving steroids like dexamethasone or prednisone an unusual or allergic reaction to zoledronic acid, other medicines, foods, dyes, or preservatives pregnant or trying to get pregnant breast-feeding How should I use this medication? This drug is injected into a vein. It is given by a health care provider in a hospital or clinic setting. Talk to your health care provider about the use of this drug in children. Special care may be needed. Overdosage: If you think you have taken too much of this medicine contact a poison control center or emergency room at once. NOTE: This medicine is only for you. Do not share this medicine with others. What if I miss a dose? Keep appointments for follow-up doses. It is important not to miss your dose. Call your health care provider if you are unable to keep an appointment. What may interact with this medication? certain antibiotics given by injection NSAIDs, medicines for pain and inflammation, like ibuprofen or naproxen some diuretics like bumetanide, furosemide teriparatide thalidomide This list may not describe all possible interactions. Give your health care provider a list of all the medicines, herbs, non-prescription drugs, or dietary supplements you use. Also tell them if you smoke, drink alcohol, or use illegal drugs. Some items may interact with your medicine. What should I watch for while using this medication? Visit your health care provider for regular checks on your progress. It may be some time before you see the benefit from this drug. Some people who take this drug have severe bone, joint, or muscle pain. This drug may also increase your risk for jaw problems or a broken thigh bone. Tell your health care provider right away if you have severe pain in your jaw, bones, joints, or muscles. Tell you health care provider if you have any pain that does not go away or that gets worse. Tell your  dentist and dental surgeon that you are taking this drug. You should not have major dental surgery while on this drug. See your dentist to have a dental exam and fix any dental problems before starting this drug. Take good care of your teeth while on this drug. Make sure you see your dentist for regular follow-up appointments. You should make sure you get enough calcium and vitamin D while you are taking this drug. Discuss the foods you eat and the vitamins you take with your health care provider. Check with your health care provider if you have severe diarrhea, nausea, and vomiting, or if you sweat a lot. The loss of too much body fluid may make it dangerous for you to take this drug. You may need blood work done while you are taking this drug. Do not become pregnant while taking this drug. Women should inform their health care provider if they wish to become pregnant or think they might be pregnant. There is potential for serious harm to an unborn child. Talk to your health care provider for more information. What side effects may I notice from receiving this medication? Side effects that you should report to your doctor or health care provider as soon as possible: allergic reactions (skin rash, itching or hives; swelling of  the face, lips, or tongue) bone pain infection (fever, chills, cough, sore throat, pain or trouble passing urine) jaw pain, especially after dental work joint pain kidney injury (trouble passing urine or change in the amount of urine) low blood pressure (dizziness; feeling faint or lightheaded, falls; unusually weak or tired) low calcium levels (fast heartbeat; muscle cramps or pain; pain, tingling, or numbness in the hands or feet; seizures) low magnesium levels (fast, irregular heartbeat; muscle cramp or pain; muscle weakness; tremors; seizures) low red blood cell counts (trouble breathing; feeling faint; lightheaded, falls; unusually weak or tired) muscle pain redness,  blistering, peeling, or loosening of the skin, including inside the mouth severe diarrhea swelling of the ankles, feet, hands trouble breathing Side effects that usually do not require medical attention (report to your doctor or health care provider if they continue or are bothersome): anxious constipation coughing depressed mood eye irritation, itching, or pain fever general ill feeling or flu-like symptoms nausea pain, redness, or irritation at site where injected trouble sleeping This list may not describe all possible side effects. Call your doctor for medical advice about side effects. You may report side effects to FDA at 1-800-FDA-1088. Where should I keep my medication? This drug is given in a hospital or clinic. It will not be stored at home. NOTE: This sheet is a summary. It may not cover all possible information. If you have questions about this medicine, talk to your doctor, pharmacist, or health care provider.  2022 Elsevier/Gold Standard (2020-11-24 00:00:00)

## 2021-03-03 NOTE — Progress Notes (Signed)
OK to trt today w/ Plts 94 per Dr. Alen Blew

## 2021-03-03 NOTE — Progress Notes (Signed)
Pt took PO 20 mg dexamethasone at home prior to appt today

## 2021-03-10 ENCOUNTER — Inpatient Hospital Stay (HOSPITAL_BASED_OUTPATIENT_CLINIC_OR_DEPARTMENT_OTHER): Payer: Medicare HMO | Admitting: Oncology

## 2021-03-10 ENCOUNTER — Inpatient Hospital Stay: Payer: Medicare HMO

## 2021-03-10 ENCOUNTER — Other Ambulatory Visit: Payer: Self-pay

## 2021-03-10 VITALS — BP 145/93 | HR 92 | Temp 97.9°F | Resp 17 | Ht 70.0 in | Wt 167.6 lb

## 2021-03-10 DIAGNOSIS — Z5112 Encounter for antineoplastic immunotherapy: Secondary | ICD-10-CM | POA: Diagnosis not present

## 2021-03-10 DIAGNOSIS — C9000 Multiple myeloma not having achieved remission: Secondary | ICD-10-CM

## 2021-03-10 LAB — MULTIPLE MYELOMA PANEL, SERUM
Albumin SerPl Elph-Mcnc: 3.8 g/dL (ref 2.9–4.4)
Albumin/Glob SerPl: 0.8 (ref 0.7–1.7)
Alpha 1: 0.3 g/dL (ref 0.0–0.4)
Alpha2 Glob SerPl Elph-Mcnc: 0.8 g/dL (ref 0.4–1.0)
B-Globulin SerPl Elph-Mcnc: 4.1 g/dL — ABNORMAL HIGH (ref 0.7–1.3)
Gamma Glob SerPl Elph-Mcnc: 0.2 g/dL — ABNORMAL LOW (ref 0.4–1.8)
Globulin, Total: 5.4 g/dL — ABNORMAL HIGH (ref 2.2–3.9)
IgA: 2926 mg/dL — ABNORMAL HIGH (ref 61–437)
IgG (Immunoglobin G), Serum: 467 mg/dL — ABNORMAL LOW (ref 603–1613)
IgM (Immunoglobulin M), Srm: 6 mg/dL — ABNORMAL LOW (ref 15–143)
M Protein SerPl Elph-Mcnc: 3.2 g/dL — ABNORMAL HIGH
Total Protein ELP: 9.2 g/dL — ABNORMAL HIGH (ref 6.0–8.5)

## 2021-03-10 LAB — CBC WITH DIFFERENTIAL (CANCER CENTER ONLY)
Abs Immature Granulocytes: 0.01 10*3/uL (ref 0.00–0.07)
Basophils Absolute: 0 10*3/uL (ref 0.0–0.1)
Basophils Relative: 0 %
Eosinophils Absolute: 0 10*3/uL (ref 0.0–0.5)
Eosinophils Relative: 1 %
HCT: 30.7 % — ABNORMAL LOW (ref 39.0–52.0)
Hemoglobin: 10.7 g/dL — ABNORMAL LOW (ref 13.0–17.0)
Immature Granulocytes: 0 %
Lymphocytes Relative: 19 %
Lymphs Abs: 0.7 10*3/uL (ref 0.7–4.0)
MCH: 32.8 pg (ref 26.0–34.0)
MCHC: 34.9 g/dL (ref 30.0–36.0)
MCV: 94.2 fL (ref 80.0–100.0)
Monocytes Absolute: 0.5 10*3/uL (ref 0.1–1.0)
Monocytes Relative: 12 %
Neutro Abs: 2.6 10*3/uL (ref 1.7–7.7)
Neutrophils Relative %: 68 %
Platelet Count: 96 10*3/uL — ABNORMAL LOW (ref 150–400)
RBC: 3.26 MIL/uL — ABNORMAL LOW (ref 4.22–5.81)
RDW: 16.9 % — ABNORMAL HIGH (ref 11.5–15.5)
WBC Count: 3.9 10*3/uL — ABNORMAL LOW (ref 4.0–10.5)
nRBC: 0 % (ref 0.0–0.2)

## 2021-03-10 LAB — CMP (CANCER CENTER ONLY)
ALT: 10 U/L (ref 0–44)
AST: 9 U/L — ABNORMAL LOW (ref 15–41)
Albumin: 3.6 g/dL (ref 3.5–5.0)
Alkaline Phosphatase: 38 U/L (ref 38–126)
Anion gap: 12 (ref 5–15)
BUN: 20 mg/dL (ref 8–23)
CO2: 20 mmol/L — ABNORMAL LOW (ref 22–32)
Calcium: 9 mg/dL (ref 8.9–10.3)
Chloride: 98 mmol/L (ref 98–111)
Creatinine: 1.28 mg/dL — ABNORMAL HIGH (ref 0.61–1.24)
GFR, Estimated: 57 mL/min — ABNORMAL LOW (ref >60)
Glucose, Bld: 172 mg/dL — ABNORMAL HIGH (ref 70–99)
Potassium: 4 mmol/L (ref 3.5–5.1)
Sodium: 130 mmol/L — ABNORMAL LOW (ref 135–145)
Total Bilirubin: 0.6 mg/dL (ref 0.3–1.2)
Total Protein: 9.1 g/dL — ABNORMAL HIGH (ref 6.5–8.1)

## 2021-03-10 MED ORDER — DEXAMETHASONE 4 MG PO TABS
20.0000 mg | ORAL_TABLET | Freq: Once | ORAL | Status: AC
  Administered 2021-03-10: 11:00:00 20 mg via ORAL

## 2021-03-10 MED ORDER — DIPHENHYDRAMINE HCL 25 MG PO CAPS
50.0000 mg | ORAL_CAPSULE | Freq: Once | ORAL | Status: AC
  Administered 2021-03-10: 11:00:00 50 mg via ORAL

## 2021-03-10 MED ORDER — DARATUMUMAB-HYALURONIDASE-FIHJ 1800-30000 MG-UT/15ML ~~LOC~~ SOLN
1800.0000 mg | Freq: Once | SUBCUTANEOUS | Status: AC
  Administered 2021-03-10: 12:00:00 1800 mg via SUBCUTANEOUS
  Filled 2021-03-10: qty 15

## 2021-03-10 MED ORDER — ACETAMINOPHEN 325 MG PO TABS
650.0000 mg | ORAL_TABLET | Freq: Once | ORAL | Status: AC
  Administered 2021-03-10: 11:00:00 650 mg via ORAL

## 2021-03-10 NOTE — Progress Notes (Signed)
Hematology and Oncology Follow Up Visit  Devin [name] [id number] March 27, 1940 80 y.o. 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   Prior Therapy:   He status post right robotic laparoscopic partial nephrectomy on October 03, 2016.  Palliative radiation therapy to the spine.  He completed 20 Gy in 10 fractions on March 24, 2020.  Velcade and dexamethasone started on January 7 to be given weekly.  Last cycle given on 09/11/2020  Revlimid 10 mg po daily for 21 days with dexamethasone 20 mg weekly started on 09/22/2020.     Current therapy:   Darzalex faspro 1800 mg weekly started on February 17, 2021.  He is here for the next cycle of therapy.  Zometa 4 mg every 3 months.  Last treatment given on December 14 and will be repeated in March 2023.    Interim History: Devin [name] returns today for a follow-up visit.  Since the last visit, he started on that daratumumab injections without any major complications.  He had denies any nausea, vomiting or abdominal pain.  He denies any infusion related complications.  He denies any excessive fatigue or tiredness.  He denies any worsening bone pain or pathological fractures.    Medications: Updated on review. Current Outpatient Medications  Medication Sig Dispense Refill   acetaminophen (TYLENOL) 500 MG tablet Take 1,000 mg by mouth daily as needed for mild pain or moderate pain.     acyclovir (ZOVIRAX) 400 MG tablet Take 1 tablet (400 mg total) by mouth daily. 90 tablet 3   allopurinol (ZYLOPRIM) 300 MG tablet Take 300 mg by mouth daily.      amLODipine (NORVASC) 10 MG tablet Take 10 mg by mouth  daily.     ASPIRIN LOW DOSE 81 MG EC tablet Take 81 mg by mouth daily.     Calcium Carb-Cholecalciferol (OYSTER SHELL CALCIUM W/D) 500-5 MG-MCG TABS TAKE 1 TABLET BY MOUTH TWICE A DAY 180 tablet 1   calcium-vitamin D (OSCAL WITH D) 500-200 MG-UNIT tablet Take 1 tablet by mouth 2 (two) times daily. 60 tablet 3   dexamethasone (DECADRON) 4 MG tablet Take 5 tablets weekly on the day of cancer treatment. 60 tablet 3   folic acid (FOLVITE) 1 MG tablet Take 1 mg by mouth daily.     furosemide (LASIX) 20 MG tablet Take 20 mg by mouth 2 (two) times daily.     labetalol (NORMODYNE) 300 MG tablet Take 300 mg by mouth 2 (two) times daily.     lidocaine (LIDODERM) 5 % Place 1 patch onto the skin every 12 (twelve) hours. Remove & Discard patch within 12 hours or as directed by MD 20 patch 0   losartan (COZAAR) 100 MG tablet Take 100 mg by mouth daily.     morphine (MS CONTIN) 30 MG 12 hr tablet Take 1 tablet (30 mg total) by mouth every 12 (twelve) hours. 60 tablet 0   morphine (MSIR) 15 MG tablet Take 1 tablet (15 mg total) by mouth every 4 (four) hours as needed for severe pain. 60 tablet 0   NARCAN 4 MG/0.1ML LIQD nasal spray kit Place 1 spray into the nose once.  polyethylene glycol (MIRALAX / GLYCOLAX) 17 g packet Take 17 g by mouth daily as needed for moderate constipation. 14 each 0   rosuvastatin (CRESTOR) 5 MG tablet Take 5 mg by mouth daily.     senna-docusate (SENOKOT-S) 8.6-50 MG tablet Take 1 tablet by mouth at bedtime. 100 tablet 0   Vitamin D, Ergocalciferol, (DRISDOL) 1.25 MG (50000 UNIT) CAPS capsule Take 50,000 Units by mouth every 14 (fourteen) days.     No current facility-administered medications for this visit.     Allergies:  Allergies  Allergen Reactions   Amlodipine Swelling   Pravastatin Other (See Comments)    Joint pain   Zestril [Lisinopril] Other (See Comments)    Gout   ( Zestril) gout   Other Other (See Comments)   Oxycodone Other (See Comments)    Causes  agitation       Physical Exam:    Blood pressure (!) 145/93, pulse 92, temperature 97.9 F (36.6 C), temperature source Temporal, resp. rate 17, height $RemoveBe'5\' 10"'AjMEioBPJ$  (1.778 m), weight 167 lb 9.6 oz (76 kg), SpO2 96 %.          ECOG:  1   General appearance: Comfortable appearing without any discomfort Head: Normocephalic without any trauma Oropharynx: Mucous membranes are moist and pink without any thrush or ulcers. Eyes: Pupils are equal and round reactive to light. Lymph nodes: No cervical, supraclavicular, inguinal or axillary lymphadenopathy.   Heart:regular rate and rhythm.  S1 and S2 without leg edema. Lung: Clear without any rhonchi or wheezes.  No dullness to percussion. Abdomin: Soft, nontender, nondistended with good bowel sounds.  No hepatosplenomegaly. Musculoskeletal: No joint deformity or effusion.  Full range of motion noted. Neurological: No deficits noted on motor, sensory and deep tendon reflex exam. Skin: No petechial rash or dryness.  Appeared moist.               Lab Results: Lab Results  Component Value Date   WBC 4.1 03/03/2021   HGB 10.6 (L) 03/03/2021   HCT 30.4 (L) 03/03/2021   MCV 94.1 03/03/2021   PLT 94 (L) 03/03/2021     Chemistry      Component Value Date/Time   NA 130 (L) 03/03/2021 1319   K 4.2 03/03/2021 1319   CL 98 03/03/2021 1319   CO2 19 (L) 03/03/2021 1319   BUN 20 03/03/2021 1319   CREATININE 1.31 (H) 03/03/2021 1319      Component Value Date/Time   CALCIUM 9.1 03/03/2021 1319   ALKPHOS 43 03/03/2021 1319   AST 8 (L) 03/03/2021 1319   ALT 10 03/03/2021 1319   BILITOT 0.6 03/03/2021 1319       Latest Reference Range & Units 01/28/21 14:48 02/24/21 12:42  M Protein SerPl Elph-Mcnc Not Observed g/dL 1.9 (H) (C) 3.0 (H) (C)  IFE 1  Comment ! (C) Comment ! (C)  Globulin, Total 2.2 - 3.9 g/dL 4.0 (H) (C) 6.0 (H) (C)  B-Globulin SerPl Elph-Mcnc 0.7 - 1.3 g/dL 0.8 (C) 0.8 (C)  IgG (Immunoglobin G), Serum 603 -  1,613 mg/dL 523 (L) 509 (L)  IgM (Immunoglobulin M), Srm 15 - 143 mg/dL 18 8 (L)  IgA 61 - 437 mg/dL 1,635 (H) 3,121 (H)  (H): Data is abnormally high !: Data is abnormal (L): Data is abnormally low (C): Corrected      Impression and Plan:  80 year old with:  1.  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    Treatment choices moving forward  were discussed at this time.  He is currently on daratumumab single agent and tolerated it without any issues.  Risks and benefits of continuing this treatment were reviewed at this time.  Alternative treatment options including addition of Kyprolis were reviewed.  Protein studies will continue to be monitored with the current treatment.  For the time being we will continue with single agent and monitor protein studies.    2.  Chronic pain: Currently manageable with morphine long-acting pain medication.    3.  Antiemetics: No nausea or vomiting reported at this time.  Compazine is available to him.   4.  Hypocalcemia: Calcium is within normal range on March 03, 2021.  5.  VZV prophylaxis: He is currently on acyclovir without any reactivation.   6.  Bone directed therapy: Next Zometa will be given in 3 months.  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  7.  Follow-up: He will continue weekly infusions for the time being and will have MD follow-up in 4 weeks.   30  minutes were dedicated to this visit.  The time was spent on reviewing laboratory data, disease status update and treatment choices for the future.   Zola Button, MD 12/21/20229:25 AM

## 2021-03-10 NOTE — Patient Instructions (Signed)
[facility]  Discharge Instructions: Thank you for choosing [facility] to provide your oncology and hematology care.   If you have a lab appointment with the Cancer Center, please go directly to the Cancer Center and check in at the registration area.   Wear comfortable clothing and clothing appropriate for easy access to any Portacath or PICC line.   We strive to give you quality time with your provider. You may need to reschedule your appointment if you arrive late (15 or more minutes).  Arriving late affects you and other patients whose appointments are after yours.  Also, if you miss three or more appointments without notifying the office, you may be dismissed from the clinic at the provider's discretion.      For prescription refill requests, have your pharmacy contact our office and allow 72 hours for refills to be completed.    Today you received the following chemotherapy and/or immunotherapy agents: Darzalex Faspro      To help prevent nausea and vomiting after your treatment, we encourage you to take your nausea medication as directed.  BELOW ARE SYMPTOMS THAT SHOULD BE REPORTED IMMEDIATELY: *FEVER GREATER THAN 100.4 F (38 C) OR HIGHER *CHILLS OR SWEATING *NAUSEA AND VOMITING THAT IS NOT CONTROLLED WITH YOUR NAUSEA MEDICATION *UNUSUAL SHORTNESS OF BREATH *UNUSUAL BRUISING OR BLEEDING *URINARY PROBLEMS (pain or burning when urinating, or frequent urination) *BOWEL PROBLEMS (unusual diarrhea, constipation, pain near the anus) TENDERNESS IN MOUTH AND THROAT WITH OR WITHOUT PRESENCE OF ULCERS (sore throat, sores in mouth, or a toothache) UNUSUAL RASH, SWELLING OR PAIN  UNUSUAL VAGINAL DISCHARGE OR ITCHING   Items with * indicate a potential emergency and should be followed up as soon as possible or go to the Emergency Department if any problems should occur.  Please show the CHEMOTHERAPY ALERT CARD or IMMUNOTHERAPY ALERT CARD at  check-in to the Emergency Department and triage nurse.  Should you have questions after your visit or need to cancel or reschedule your appointment, please contact [facility]  Dept: [phone]  and follow the prompts.  Office hours are 8:00 a.m. to 4:30 p.m. Monday - Friday. Please note that voicemails left after 4:00 p.m. may not be returned until the following business day.  We are closed weekends and major holidays. You have access to a nurse at all times for urgent questions. Please call the main number to the clinic Dept: [phone] and follow the prompts.   For any non-urgent questions, you may also contact your provider using MyChart. We now offer e-Visits for anyone 18 and older to request care online for non-urgent symptoms. For details visit [email or web address].   Also download the MyChart app! Go to the app store, search "MyChart", open the app, select [place] [place], and log in with your MyChart username and password.  Due to Covid, a mask is required upon entering the hospital/clinic. If you do not have a mask, one will be given to you upon arrival. For doctor visits, patients may have 1 support person aged 18 or older with them. For treatment visits, patients cannot have anyone with them due to current Covid guidelines and our immunocompromised population.  

## 2021-03-10 NOTE — Progress Notes (Signed)
Per Dr. Alen Blew OK to trt w/ Plts 96

## 2021-03-17 ENCOUNTER — Inpatient Hospital Stay: Payer: Medicare HMO

## 2021-03-17 ENCOUNTER — Other Ambulatory Visit: Payer: Self-pay

## 2021-03-17 VITALS — BP 155/89 | HR 85 | Temp 98.2°F | Resp 18 | Wt 167.8 lb

## 2021-03-17 DIAGNOSIS — C9000 Multiple myeloma not having achieved remission: Secondary | ICD-10-CM

## 2021-03-17 DIAGNOSIS — Z5112 Encounter for antineoplastic immunotherapy: Secondary | ICD-10-CM | POA: Diagnosis not present

## 2021-03-17 LAB — CMP (CANCER CENTER ONLY)
ALT: 10 U/L (ref 0–44)
AST: 9 U/L — ABNORMAL LOW (ref 15–41)
Albumin: 3.6 g/dL (ref 3.5–5.0)
Alkaline Phosphatase: 36 U/L — ABNORMAL LOW (ref 38–126)
Anion gap: 15 (ref 5–15)
BUN: 18 mg/dL (ref 8–23)
CO2: 17 mmol/L — ABNORMAL LOW (ref 22–32)
Calcium: 8.9 mg/dL (ref 8.9–10.3)
Chloride: 98 mmol/L (ref 98–111)
Creatinine: 1.33 mg/dL — ABNORMAL HIGH (ref 0.61–1.24)
GFR, Estimated: 54 mL/min — ABNORMAL LOW (ref >60)
Glucose, Bld: 197 mg/dL — ABNORMAL HIGH (ref 70–99)
Potassium: 4.2 mmol/L (ref 3.5–5.1)
Sodium: 130 mmol/L — ABNORMAL LOW (ref 135–145)
Total Bilirubin: 0.5 mg/dL (ref 0.3–1.2)
Total Protein: 9.4 g/dL — ABNORMAL HIGH (ref 6.5–8.1)

## 2021-03-17 LAB — CBC WITH DIFFERENTIAL (CANCER CENTER ONLY)
Abs Immature Granulocytes: 0.01 10*3/uL (ref 0.00–0.07)
Basophils Absolute: 0 10*3/uL (ref 0.0–0.1)
Basophils Relative: 0 %
Eosinophils Absolute: 0 10*3/uL (ref 0.0–0.5)
Eosinophils Relative: 0 %
HCT: 31.7 % — ABNORMAL LOW (ref 39.0–52.0)
Hemoglobin: 10.8 g/dL — ABNORMAL LOW (ref 13.0–17.0)
Immature Granulocytes: 0 %
Lymphocytes Relative: 8 %
Lymphs Abs: 0.3 10*3/uL — ABNORMAL LOW (ref 0.7–4.0)
MCH: 32.5 pg (ref 26.0–34.0)
MCHC: 34.1 g/dL (ref 30.0–36.0)
MCV: 95.5 fL (ref 80.0–100.0)
Monocytes Absolute: 0.1 10*3/uL (ref 0.1–1.0)
Monocytes Relative: 2 %
Neutro Abs: 3.8 10*3/uL (ref 1.7–7.7)
Neutrophils Relative %: 90 %
Platelet Count: 106 10*3/uL — ABNORMAL LOW (ref 150–400)
RBC: 3.32 MIL/uL — ABNORMAL LOW (ref 4.22–5.81)
RDW: 17.1 % — ABNORMAL HIGH (ref 11.5–15.5)
WBC Count: 4.3 10*3/uL (ref 4.0–10.5)
nRBC: 0 % (ref 0.0–0.2)

## 2021-03-17 MED ORDER — DARATUMUMAB-HYALURONIDASE-FIHJ 1800-30000 MG-UT/15ML ~~LOC~~ SOLN
1800.0000 mg | Freq: Once | SUBCUTANEOUS | Status: AC
  Administered 2021-03-17: 15:00:00 1800 mg via SUBCUTANEOUS
  Filled 2021-03-17: qty 15

## 2021-03-17 MED ORDER — DEXAMETHASONE 4 MG PO TABS: 20.0000 mg | ORAL_TABLET | Freq: Once | ORAL | Status: DC

## 2021-03-17 MED ORDER — ACETAMINOPHEN 325 MG PO TABS: 650.0000 mg | ORAL_TABLET | Freq: Once | ORAL | Status: DC

## 2021-03-17 MED ORDER — DIPHENHYDRAMINE HCL 25 MG PO CAPS
50.0000 mg | ORAL_CAPSULE | Freq: Once | ORAL | Status: AC
  Administered 2021-03-17: 14:00:00 50 mg via ORAL
  Filled 2021-03-17: qty 2

## 2021-03-17 NOTE — Patient Instructions (Signed)
[facility]COL[facility]  Discharge Instructions: Thank you for choosing [facility] to provide your oncology and hematology care.   If you have a lab appointment with the Cancer Center, please go directly to the Cancer Center and check in at the registration area.   Wear comfortable clothing and clothing appropriate for easy access to any Portacath or PICC line.   We strive to give you quality time with your provider. You may need to reschedule your appointment if you arrive late (15 or more minutes).  Arriving late affects you and other patients whose appointments are after yours.  Also, if you miss three or more appointments without notifying the office, you may be dismissed from the clinic at the provider's discretion.      For prescription refill requests, have your pharmacy contact our office and allow 72 hours for refills to be completed.    Today you received the following chemotherapy and/or immunotherapy agents: Darzalex Faspro      To help prevent nausea and vomiting after your treatment, we encourage you to take your nausea medication as directed.  BELOW ARE SYMPTOMS THAT SHOULD BE REPORTED IMMEDIATELY: *FEVER GREATER THAN 100.4 F (38 C) OR HIGHER *CHILLS OR SWEATING *NAUSEA AND VOMITING THAT IS NOT CONTROLLED WITH YOUR NAUSEA MEDICATION *UNUSUAL SHORTNESS OF BREATH *UNUSUAL BRUISING OR BLEEDING *URINARY PROBLEMS (pain or burning when urinating, or frequent urination) *BOWEL PROBLEMS (unusual diarrhea, constipation, pain near the anus) TENDERNESS IN MOUTH AND THROAT WITH OR WITHOUT PRESENCE OF ULCERS (sore throat, sores in mouth, or a toothache) UNUSUAL RASH, SWELLING OR PAIN  UNUSUAL VAGINAL DISCHARGE OR ITCHING   Items with * indicate a potential emergency and should be followed up as soon as possible or go to the Emergency Department if any problems should occur.  Please show the CHEMOTHERAPY ALERT CARD or IMMUNOTHERAPY ALERT CARD at  check-in to the Emergency Department and triage nurse.  Should you have questions after your visit or need to cancel or reschedule your appointment, please contact [facility]  Dept: [phone]  and follow the prompts.  Office hours are 8:00 a.m. to 4:30 p.m. Monday - Friday. Please note that voicemails left after 4:00 p.m. may not be returned until the following business day.  We are closed weekends and major holidays. You have access to a nurse at all times for urgent questions. Please call the main number to the clinic Dept: [phone] and follow the prompts.   For any non-urgent questions, you may also contact your provider using MyChart. We now offer e-Visits for anyone 18 and older to request care online for non-urgent symptoms. For details visit [email or web address].   Also download the MyChart app! Go to the app store, search "MyChart", open the app, select [place], and log in with your MyChart username and password.  Due to Covid, a mask is required upon entering the hospital/clinic. If you do not have a mask, one will be given to you upon arrival. For doctor visits, patients may have 1 support person aged 18 or older with them. For treatment visits, patients cannot have anyone with them due to current Covid guidelines and our immunocompromised population.  

## 2021-03-17 NOTE — Progress Notes (Signed)
Per Dr.Shadad ok to just give benadryl since pt reports taking dex and tylenol prior to treatment.

## 2021-03-23 ENCOUNTER — Other Ambulatory Visit: Payer: Self-pay | Admitting: *Deleted

## 2021-03-23 DIAGNOSIS — C649 Malignant neoplasm of unspecified kidney, except renal pelvis: Secondary | ICD-10-CM

## 2021-03-23 MED ORDER — MORPHINE SULFATE ER 30 MG PO TBCR: 30.0000 mg | EXTENDED_RELEASE_TABLET | Freq: Two times a day (BID) | ORAL | 0 refills | Status: DC

## 2021-03-23 MED ORDER — MORPHINE SULFATE 15 MG PO TABS: 15.0000 mg | ORAL_TABLET | ORAL | 0 refills | Status: DC | PRN

## 2021-03-23 NOTE — Telephone Encounter (Signed)
Patient wife called and requested refill of Morphine (MS CONTIN) 30 MG 12 hr tablet and Morphine (MSIR) 15 MG tablet be sent to [facility] in [place]. Refill request routed to Dr. Alen Blew

## 2021-03-24 ENCOUNTER — Inpatient Hospital Stay: Payer: Medicare HMO

## 2021-03-24 ENCOUNTER — Inpatient Hospital Stay: Payer: Medicare HMO | Attending: Oncology

## 2021-03-24 ENCOUNTER — Other Ambulatory Visit: Payer: Self-pay

## 2021-03-24 VITALS — BP 140/91 | HR 82 | Temp 98.5°F | Resp 18 | Wt 165.0 lb

## 2021-03-24 DIAGNOSIS — Z5112 Encounter for antineoplastic immunotherapy: Secondary | ICD-10-CM | POA: Insufficient documentation

## 2021-03-24 DIAGNOSIS — C9000 Multiple myeloma not having achieved remission: Secondary | ICD-10-CM | POA: Insufficient documentation

## 2021-03-24 DIAGNOSIS — C419 Malignant neoplasm of bone and articular cartilage, unspecified: Secondary | ICD-10-CM

## 2021-03-24 LAB — CBC WITH DIFFERENTIAL (CANCER CENTER ONLY)
Abs Immature Granulocytes: 0.01 10*3/uL (ref 0.00–0.07)
Basophils Absolute: 0 10*3/uL (ref 0.0–0.1)
Basophils Relative: 0 %
Eosinophils Absolute: 0 10*3/uL (ref 0.0–0.5)
Eosinophils Relative: 1 %
HCT: 29.3 % — ABNORMAL LOW (ref 39.0–52.0)
Hemoglobin: 10.3 g/dL — ABNORMAL LOW (ref 13.0–17.0)
Immature Granulocytes: 0 %
Lymphocytes Relative: 17 %
Lymphs Abs: 0.6 10*3/uL — ABNORMAL LOW (ref 0.7–4.0)
MCH: 33.3 pg (ref 26.0–34.0)
MCHC: 35.2 g/dL (ref 30.0–36.0)
MCV: 94.8 fL (ref 80.0–100.0)
Monocytes Absolute: 0.3 10*3/uL (ref 0.1–1.0)
Monocytes Relative: 9 %
Neutro Abs: 2.7 10*3/uL (ref 1.7–7.7)
Neutrophils Relative %: 73 %
Platelet Count: 97 10*3/uL — ABNORMAL LOW (ref 150–400)
RBC: 3.09 MIL/uL — ABNORMAL LOW (ref 4.22–5.81)
RDW: 16.8 % — ABNORMAL HIGH (ref 11.5–15.5)
WBC Count: 3.7 10*3/uL — ABNORMAL LOW (ref 4.0–10.5)
nRBC: 0 % (ref 0.0–0.2)

## 2021-03-24 LAB — CMP (CANCER CENTER ONLY)
ALT: 11 U/L (ref 0–44)
AST: 10 U/L — ABNORMAL LOW (ref 15–41)
Albumin: 3.5 g/dL (ref 3.5–5.0)
Alkaline Phosphatase: 34 U/L — ABNORMAL LOW (ref 38–126)
Anion gap: 12 (ref 5–15)
BUN: 20 mg/dL (ref 8–23)
CO2: 20 mmol/L — ABNORMAL LOW (ref 22–32)
Calcium: 9 mg/dL (ref 8.9–10.3)
Chloride: 98 mmol/L (ref 98–111)
Creatinine: 1.3 mg/dL — ABNORMAL HIGH (ref 0.61–1.24)
GFR, Estimated: 56 mL/min — ABNORMAL LOW (ref >60)
Glucose, Bld: 162 mg/dL — ABNORMAL HIGH (ref 70–99)
Potassium: 4 mmol/L (ref 3.5–5.1)
Sodium: 130 mmol/L — ABNORMAL LOW (ref 135–145)
Total Bilirubin: 0.6 mg/dL (ref 0.3–1.2)
Total Protein: 9.2 g/dL — ABNORMAL HIGH (ref 6.5–8.1)

## 2021-03-24 MED ORDER — ACETAMINOPHEN 325 MG PO TABS
650.0000 mg | ORAL_TABLET | Freq: Once | ORAL | Status: AC
  Administered 2021-03-24: 650 mg via ORAL
  Filled 2021-03-24: qty 2

## 2021-03-24 MED ORDER — DARATUMUMAB-HYALURONIDASE-FIHJ 1800-30000 MG-UT/15ML ~~LOC~~ SOLN
1800.0000 mg | Freq: Once | SUBCUTANEOUS | Status: AC
  Administered 2021-03-24: 1800 mg via SUBCUTANEOUS
  Filled 2021-03-24: qty 15

## 2021-03-24 MED ORDER — DIPHENHYDRAMINE HCL 25 MG PO CAPS
50.0000 mg | ORAL_CAPSULE | Freq: Once | ORAL | Status: AC
  Administered 2021-03-24: 50 mg via ORAL
  Filled 2021-03-24: qty 2

## 2021-03-24 NOTE — Progress Notes (Signed)
Per pt, took 5 tablets of dexamethasone 4mg  at 0730 this morning.

## 2021-03-24 NOTE — Patient Instructions (Signed)
[facility]  Discharge Instructions: Thank you for choosing [facility] to provide your oncology and hematology care.   If you have a lab appointment with the Cancer Center, please go directly to the Cancer Center and check in at the registration area.   Wear comfortable clothing and clothing appropriate for easy access to any Portacath or PICC line.   We strive to give you quality time with your provider. You may need to reschedule your appointment if you arrive late (15 or more minutes).  Arriving late affects you and other patients whose appointments are after yours.  Also, if you miss three or more appointments without notifying the office, you may be dismissed from the clinic at the provider's discretion.      For prescription refill requests, have your pharmacy contact our office and allow 72 hours for refills to be completed.    Today you received the following chemotherapy and/or immunotherapy agents: Darzalex Faspro      To help prevent nausea and vomiting after your treatment, we encourage you to take your nausea medication as directed.  BELOW ARE SYMPTOMS THAT SHOULD BE REPORTED IMMEDIATELY: *FEVER GREATER THAN 100.4 F (38 C) OR HIGHER *CHILLS OR SWEATING *NAUSEA AND VOMITING THAT IS NOT CONTROLLED WITH YOUR NAUSEA MEDICATION *UNUSUAL SHORTNESS OF BREATH *UNUSUAL BRUISING OR BLEEDING *URINARY PROBLEMS (pain or burning when urinating, or frequent urination) *BOWEL PROBLEMS (unusual diarrhea, constipation, pain near the anus) TENDERNESS IN MOUTH AND THROAT WITH OR WITHOUT PRESENCE OF ULCERS (sore throat, sores in mouth, or a toothache) UNUSUAL RASH, SWELLING OR PAIN  UNUSUAL VAGINAL DISCHARGE OR ITCHING   Items with * indicate a potential emergency and should be followed up as soon as possible or go to the Emergency Department if any problems should occur.  Please show the CHEMOTHERAPY ALERT CARD or IMMUNOTHERAPY ALERT CARD at  check-in to the Emergency Department and triage nurse.  Should you have questions after your visit or need to cancel or reschedule your appointment, please contact [facility]  Dept: [phone]  and follow the prompts.  Office hours are 8:00 a.m. to 4:30 p.m. Monday - Friday. Please note that voicemails left after 4:00 p.m. may not be returned until the following business day.  We are closed weekends and major holidays. You have access to a nurse at all times for urgent questions. Please call the main number to the clinic Dept: [phone] and follow the prompts.   For any non-urgent questions, you may also contact your provider using MyChart. We now offer e-Visits for anyone 18 and older to request care online for non-urgent symptoms. For details visit [email or web address].   Also download the MyChart app! Go to the app store, search "MyChart", open the app, select [facility], and log in with your MyChart username and password.  Due to Covid, a mask is required upon entering the hospital/clinic. If you do not have a mask, one will be given to you upon arrival. For doctor visits, patients may have 1 support person aged 18 or older with them. For treatment visits, patients cannot have anyone with them due to current Covid guidelines and our immunocompromised population.  

## 2021-03-24 NOTE — Progress Notes (Signed)
Per Dr. Alen Blew, ok to treat platelets of 97 today

## 2021-03-25 LAB — KAPPA/LAMBDA LIGHT CHAINS
Kappa free light chain: 4.5 mg/L (ref 3.3–19.4)
Kappa, lambda light chain ratio: 0.01 — ABNORMAL LOW (ref 0.26–1.65)
Lambda free light chains: 476 mg/L — ABNORMAL HIGH (ref 5.7–26.3)

## 2021-03-29 DIAGNOSIS — H43813 Vitreous degeneration, bilateral: Secondary | ICD-10-CM | POA: Diagnosis not present

## 2021-03-29 DIAGNOSIS — H25813 Combined forms of age-related cataract, bilateral: Secondary | ICD-10-CM | POA: Diagnosis not present

## 2021-03-29 DIAGNOSIS — D23111 Other benign neoplasm of skin of right upper eyelid, including canthus: Secondary | ICD-10-CM | POA: Diagnosis not present

## 2021-03-29 DIAGNOSIS — H527 Unspecified disorder of refraction: Secondary | ICD-10-CM | POA: Diagnosis not present

## 2021-03-29 DIAGNOSIS — H35373 Puckering of macula, bilateral: Secondary | ICD-10-CM | POA: Diagnosis not present

## 2021-03-29 LAB — MULTIPLE MYELOMA PANEL, SERUM
Albumin SerPl Elph-Mcnc: 3.6 g/dL (ref 2.9–4.4)
Albumin/Glob SerPl: 0.7 (ref 0.7–1.7)
Alpha 1: 0.3 g/dL (ref 0.0–0.4)
Alpha2 Glob SerPl Elph-Mcnc: 0.7 g/dL (ref 0.4–1.0)
B-Globulin SerPl Elph-Mcnc: 0.6 g/dL — ABNORMAL LOW (ref 0.7–1.3)
Gamma Glob SerPl Elph-Mcnc: 3.6 g/dL — ABNORMAL HIGH (ref 0.4–1.8)
Globulin, Total: 5.2 g/dL — ABNORMAL HIGH (ref 2.2–3.9)
IgA: 2683 mg/dL — ABNORMAL HIGH (ref 61–437)
IgG (Immunoglobin G), Serum: 391 mg/dL — ABNORMAL LOW (ref 603–1613)
IgM (Immunoglobulin M), Srm: 5 mg/dL — ABNORMAL LOW (ref 15–143)
M Protein SerPl Elph-Mcnc: 3 g/dL — ABNORMAL HIGH
Total Protein ELP: 8.8 g/dL — ABNORMAL HIGH (ref 6.0–8.5)

## 2021-03-31 ENCOUNTER — Inpatient Hospital Stay: Payer: Medicare HMO

## 2021-03-31 ENCOUNTER — Other Ambulatory Visit: Payer: Self-pay

## 2021-03-31 VITALS — BP 144/96 | HR 91 | Temp 98.6°F | Resp 18 | Wt 164.5 lb

## 2021-03-31 DIAGNOSIS — C9000 Multiple myeloma not having achieved remission: Secondary | ICD-10-CM

## 2021-03-31 DIAGNOSIS — Z5112 Encounter for antineoplastic immunotherapy: Secondary | ICD-10-CM | POA: Diagnosis not present

## 2021-03-31 LAB — CMP (CANCER CENTER ONLY)
ALT: 12 U/L (ref 0–44)
AST: 10 U/L — ABNORMAL LOW (ref 15–41)
Albumin: 3.7 g/dL (ref 3.5–5.0)
Alkaline Phosphatase: 30 U/L — ABNORMAL LOW (ref 38–126)
Anion gap: 16 — ABNORMAL HIGH (ref 5–15)
BUN: 21 mg/dL (ref 8–23)
CO2: 18 mmol/L — ABNORMAL LOW (ref 22–32)
Calcium: 9.1 mg/dL (ref 8.9–10.3)
Chloride: 94 mmol/L — ABNORMAL LOW (ref 98–111)
Creatinine: 1.73 mg/dL — ABNORMAL HIGH (ref 0.61–1.24)
GFR, Estimated: 40 mL/min — ABNORMAL LOW (ref >60)
Glucose, Bld: 205 mg/dL — ABNORMAL HIGH (ref 70–99)
Potassium: 4 mmol/L (ref 3.5–5.1)
Sodium: 128 mmol/L — ABNORMAL LOW (ref 135–145)
Total Bilirubin: 1 mg/dL (ref 0.3–1.2)
Total Protein: 9.6 g/dL — ABNORMAL HIGH (ref 6.5–8.1)

## 2021-03-31 LAB — CBC WITH DIFFERENTIAL (CANCER CENTER ONLY)
Abs Immature Granulocytes: 0.02 10*3/uL (ref 0.00–0.07)
Basophils Absolute: 0 10*3/uL (ref 0.0–0.1)
Basophils Relative: 0 %
Eosinophils Absolute: 0 10*3/uL (ref 0.0–0.5)
Eosinophils Relative: 0 %
HCT: 31.5 % — ABNORMAL LOW (ref 39.0–52.0)
Hemoglobin: 11.2 g/dL — ABNORMAL LOW (ref 13.0–17.0)
Immature Granulocytes: 0 %
Lymphocytes Relative: 4 %
Lymphs Abs: 0.3 10*3/uL — ABNORMAL LOW (ref 0.7–4.0)
MCH: 34 pg (ref 26.0–34.0)
MCHC: 35.6 g/dL (ref 30.0–36.0)
MCV: 95.7 fL (ref 80.0–100.0)
Monocytes Absolute: 0.5 10*3/uL (ref 0.1–1.0)
Monocytes Relative: 6 %
Neutro Abs: 7.7 10*3/uL (ref 1.7–7.7)
Neutrophils Relative %: 90 %
Platelet Count: 102 10*3/uL — ABNORMAL LOW (ref 150–400)
RBC: 3.29 MIL/uL — ABNORMAL LOW (ref 4.22–5.81)
RDW: 16.8 % — ABNORMAL HIGH (ref 11.5–15.5)
WBC Count: 8.5 10*3/uL (ref 4.0–10.5)
nRBC: 0 % (ref 0.0–0.2)

## 2021-03-31 MED ORDER — DARATUMUMAB-HYALURONIDASE-FIHJ 1800-30000 MG-UT/15ML ~~LOC~~ SOLN
1800.0000 mg | Freq: Once | SUBCUTANEOUS | Status: AC
  Administered 2021-03-31: 1800 mg via SUBCUTANEOUS
  Filled 2021-03-31: qty 15

## 2021-03-31 MED ORDER — DIPHENHYDRAMINE HCL 25 MG PO CAPS
50.0000 mg | ORAL_CAPSULE | Freq: Once | ORAL | Status: AC
  Administered 2021-03-31: 50 mg via ORAL
  Filled 2021-03-31: qty 2

## 2021-03-31 MED ORDER — ACETAMINOPHEN 325 MG PO TABS
650.0000 mg | ORAL_TABLET | Freq: Once | ORAL | Status: AC
  Administered 2021-03-31: 650 mg via ORAL
  Filled 2021-03-31: qty 2

## 2021-03-31 MED ORDER — DEXAMETHASONE 4 MG PO TABS
20.0000 mg | ORAL_TABLET | Freq: Once | ORAL | Status: DC
  Filled 2021-03-31: qty 5

## 2021-03-31 NOTE — Patient Instructions (Signed)
[facility]  Discharge Instructions: Thank you for choosing [facility] to provide your oncology and hematology care.   If you have a lab appointment with the Cancer Center, please go directly to the Cancer Center and check in at the registration area.   Wear comfortable clothing and clothing appropriate for easy access to any Portacath or PICC line.   We strive to give you quality time with your provider. You may need to reschedule your appointment if you arrive late (15 or more minutes).  Arriving late affects you and other patients whose appointments are after yours.  Also, if you miss three or more appointments without notifying the office, you may be dismissed from the clinic at the provider's discretion.      For prescription refill requests, have your pharmacy contact our office and allow 72 hours for refills to be completed.    Today you received the following chemotherapy and/or immunotherapy agents: Darzalex Faspro      To help prevent nausea and vomiting after your treatment, we encourage you to take your nausea medication as directed.  BELOW ARE SYMPTOMS THAT SHOULD BE REPORTED IMMEDIATELY: *FEVER GREATER THAN 100.4 F (38 C) OR HIGHER *CHILLS OR SWEATING *NAUSEA AND VOMITING THAT IS NOT CONTROLLED WITH YOUR NAUSEA MEDICATION *UNUSUAL SHORTNESS OF BREATH *UNUSUAL BRUISING OR BLEEDING *URINARY PROBLEMS (pain or burning when urinating, or frequent urination) *BOWEL PROBLEMS (unusual diarrhea, constipation, pain near the anus) TENDERNESS IN MOUTH AND THROAT WITH OR WITHOUT PRESENCE OF ULCERS (sore throat, sores in mouth, or a toothache) UNUSUAL RASH, SWELLING OR PAIN  UNUSUAL VAGINAL DISCHARGE OR ITCHING   Items with * indicate a potential emergency and should be followed up as soon as possible or go to the Emergency Department if any problems should occur.  Please show the CHEMOTHERAPY ALERT CARD or IMMUNOTHERAPY ALERT CARD at  check-in to the Emergency Department and triage nurse.  Should you have questions after your visit or need to cancel or reschedule your appointment, please contact [facility]  Dept: [phone]  and follow the prompts.  Office hours are 8:00 a.m. to 4:30 p.m. Monday - Friday. Please note that voicemails left after 4:00 p.m. may not be returned until the following business day.  We are closed weekends and major holidays. You have access to a nurse at all times for urgent questions. Please call the main number to the clinic Dept: [phone] and follow the prompts.   For any non-urgent questions, you may also contact your provider using MyChart. We now offer e-Visits for anyone 18 and older to request care online for non-urgent symptoms. For details visit [email or web address].   Also download the MyChart app! Go to the app store, search "MyChart", open the app, select [place], and log in with your MyChart username and password.  Due to Covid, a mask is required upon entering the hospital/clinic. If you do not have a mask, one will be given to you upon arrival. For doctor visits, patients may have 1 support person aged 18 or older with them. For treatment visits, patients cannot have anyone with them due to current Covid guidelines and our immunocompromised population.  

## 2021-04-01 LAB — KAPPA/LAMBDA LIGHT CHAINS
Kappa free light chain: 4.4 mg/L (ref 3.3–19.4)
Kappa, lambda light chain ratio: 0.01 — ABNORMAL LOW (ref 0.26–1.65)
Lambda free light chains: 570.7 mg/L — ABNORMAL HIGH (ref 5.7–26.3)

## 2021-04-05 LAB — MULTIPLE MYELOMA PANEL, SERUM
Albumin SerPl Elph-Mcnc: 3.9 g/dL (ref 2.9–4.4)
Albumin/Glob SerPl: 0.8 (ref 0.7–1.7)
Alpha 1: 0.3 g/dL (ref 0.0–0.4)
Alpha2 Glob SerPl Elph-Mcnc: 0.7 g/dL (ref 0.4–1.0)
B-Globulin SerPl Elph-Mcnc: 0.8 g/dL (ref 0.7–1.3)
Gamma Glob SerPl Elph-Mcnc: 3.7 g/dL — ABNORMAL HIGH (ref 0.4–1.8)
Globulin, Total: 5.5 g/dL — ABNORMAL HIGH (ref 2.2–3.9)
IgA: 2821 mg/dL — ABNORMAL HIGH (ref 61–437)
IgG (Immunoglobin G), Serum: 394 mg/dL — ABNORMAL LOW (ref 603–1613)
IgM (Immunoglobulin M), Srm: <5 mg/dL — ABNORMAL LOW (ref 15–143)
M Protein SerPl Elph-Mcnc: 3.1 g/dL — ABNORMAL HIGH
Total Protein ELP: 9.4 g/dL — ABNORMAL HIGH (ref 6.0–8.5)

## 2021-04-06 ENCOUNTER — Encounter: Payer: Self-pay | Admitting: Oncology

## 2021-04-06 NOTE — Progress Notes (Signed)
I completed the online application w/ the Patient Devin [name] and he was approved for $12,000 for 12 months from 04/06/21 w/ a 6 month look back period.

## 2021-04-07 ENCOUNTER — Inpatient Hospital Stay: Payer: Medicare HMO

## 2021-04-07 ENCOUNTER — Other Ambulatory Visit: Payer: Self-pay

## 2021-04-07 VITALS — BP 150/87 | HR 75 | Temp 98.3°F | Resp 17 | Wt 165.5 lb

## 2021-04-07 DIAGNOSIS — C9000 Multiple myeloma not having achieved remission: Secondary | ICD-10-CM | POA: Diagnosis not present

## 2021-04-07 DIAGNOSIS — Z5112 Encounter for antineoplastic immunotherapy: Secondary | ICD-10-CM | POA: Diagnosis not present

## 2021-04-07 LAB — CBC WITH DIFFERENTIAL (CANCER CENTER ONLY)
Abs Immature Granulocytes: 0.01 10*3/uL (ref 0.00–0.07)
Basophils Absolute: 0 10*3/uL (ref 0.0–0.1)
Basophils Relative: 0 %
Eosinophils Absolute: 0.1 10*3/uL (ref 0.0–0.5)
Eosinophils Relative: 1 %
HCT: 29.6 % — ABNORMAL LOW (ref 39.0–52.0)
Hemoglobin: 10.4 g/dL — ABNORMAL LOW (ref 13.0–17.0)
Immature Granulocytes: 0 %
Lymphocytes Relative: 15 %
Lymphs Abs: 0.7 10*3/uL (ref 0.7–4.0)
MCH: 33.8 pg (ref 26.0–34.0)
MCHC: 35.1 g/dL (ref 30.0–36.0)
MCV: 96.1 fL (ref 80.0–100.0)
Monocytes Absolute: 0.2 10*3/uL (ref 0.1–1.0)
Monocytes Relative: 4 %
Neutro Abs: 3.5 10*3/uL (ref 1.7–7.7)
Neutrophils Relative %: 80 %
Platelet Count: 109 10*3/uL — ABNORMAL LOW (ref 150–400)
RBC: 3.08 MIL/uL — ABNORMAL LOW (ref 4.22–5.81)
RDW: 16.5 % — ABNORMAL HIGH (ref 11.5–15.5)
WBC Count: 4.4 10*3/uL (ref 4.0–10.5)
nRBC: 0 % (ref 0.0–0.2)

## 2021-04-07 LAB — CMP (CANCER CENTER ONLY)
ALT: 13 U/L (ref 0–44)
AST: 9 U/L — ABNORMAL LOW (ref 15–41)
Albumin: 3.5 g/dL (ref 3.5–5.0)
Alkaline Phosphatase: 31 U/L — ABNORMAL LOW (ref 38–126)
Anion gap: 11 (ref 5–15)
BUN: 16 mg/dL (ref 8–23)
CO2: 21 mmol/L — ABNORMAL LOW (ref 22–32)
Calcium: 8.7 mg/dL — ABNORMAL LOW (ref 8.9–10.3)
Chloride: 96 mmol/L — ABNORMAL LOW (ref 98–111)
Creatinine: 1.32 mg/dL — ABNORMAL HIGH (ref 0.61–1.24)
GFR, Estimated: 55 mL/min — ABNORMAL LOW (ref >60)
Glucose, Bld: 125 mg/dL — ABNORMAL HIGH (ref 70–99)
Potassium: 4.3 mmol/L (ref 3.5–5.1)
Sodium: 128 mmol/L — ABNORMAL LOW (ref 135–145)
Total Bilirubin: 0.8 mg/dL (ref 0.3–1.2)
Total Protein: 8.9 g/dL — ABNORMAL HIGH (ref 6.5–8.1)

## 2021-04-07 MED ORDER — DIPHENHYDRAMINE HCL 25 MG PO CAPS
50.0000 mg | ORAL_CAPSULE | Freq: Once | ORAL | Status: AC
  Administered 2021-04-07: 50 mg via ORAL
  Filled 2021-04-07: qty 2

## 2021-04-07 MED ORDER — ACETAMINOPHEN 325 MG PO TABS
650.0000 mg | ORAL_TABLET | Freq: Once | ORAL | Status: AC
  Administered 2021-04-07: 650 mg via ORAL
  Filled 2021-04-07: qty 2

## 2021-04-07 MED ORDER — DEXAMETHASONE 4 MG PO TABS: 20.0000 mg | ORAL_TABLET | Freq: Once | ORAL | Status: DC

## 2021-04-07 MED ORDER — DARATUMUMAB-HYALURONIDASE-FIHJ 1800-30000 MG-UT/15ML ~~LOC~~ SOLN
1800.0000 mg | Freq: Once | SUBCUTANEOUS | Status: AC
  Administered 2021-04-07: 1800 mg via SUBCUTANEOUS
  Filled 2021-04-07: qty 15

## 2021-04-07 NOTE — Progress Notes (Signed)
Patient took 20 mg decadron at noon

## 2021-04-07 NOTE — Patient Instructions (Signed)
[facility]  Discharge Instructions: Thank you for choosing [facility] to provide your oncology and hematology care.   If you have a lab appointment with the Cancer Center, please go directly to the Cancer Center and check in at the registration area.   Wear comfortable clothing and clothing appropriate for easy access to any Portacath or PICC line.   We strive to give you quality time with your provider. You may need to reschedule your appointment if you arrive late (15 or more minutes).  Arriving late affects you and other patients whose appointments are after yours.  Also, if you miss three or more appointments without notifying the office, you may be dismissed from the clinic at the provider's discretion.      For prescription refill requests, have your pharmacy contact our office and allow 72 hours for refills to be completed.    Today you received the following chemotherapy and/or immunotherapy agents: Darzalex Faspro      To help prevent nausea and vomiting after your treatment, we encourage you to take your nausea medication as directed.  BELOW ARE SYMPTOMS THAT SHOULD BE REPORTED IMMEDIATELY: *FEVER GREATER THAN 100.4 F (38 C) OR HIGHER *CHILLS OR SWEATING *NAUSEA AND VOMITING THAT IS NOT CONTROLLED WITH YOUR NAUSEA MEDICATION *UNUSUAL SHORTNESS OF BREATH *UNUSUAL BRUISING OR BLEEDING *URINARY PROBLEMS (pain or burning when urinating, or frequent urination) *BOWEL PROBLEMS (unusual diarrhea, constipation, pain near the anus) TENDERNESS IN MOUTH AND THROAT WITH OR WITHOUT PRESENCE OF ULCERS (sore throat, sores in mouth, or a toothache) UNUSUAL RASH, SWELLING OR PAIN  UNUSUAL VAGINAL DISCHARGE OR ITCHING   Items with * indicate a potential emergency and should be followed up as soon as possible or go to the Emergency Department if any problems should occur.  Please show the CHEMOTHERAPY ALERT CARD or IMMUNOTHERAPY ALERT CARD at  check-in to the Emergency Department and triage nurse.  Should you have questions after your visit or need to cancel or reschedule your appointment, please contact [facility]  Dept: [phone]  and follow the prompts.  Office hours are 8:00 a.m. to 4:30 p.m. Monday - Friday. Please note that voicemails left after 4:00 p.m. may not be returned until the following business day.  We are closed weekends and major holidays. You have access to a nurse at all times for urgent questions. Please call the main number to the clinic Dept: [phone] and follow the prompts.   For any non-urgent questions, you may also contact your provider using MyChart. We now offer e-Visits for anyone 18 and older to request care online for non-urgent symptoms. For details visit [email or web address].   Also download the MyChart app! Go to the app store, search "MyChart", open the app, select [place], and log in with your MyChart username and password.  Due to Covid, a mask is required upon entering the hospital/clinic. If you do not have a mask, one will be given to you upon arrival. For doctor visits, patients may have 1 support person aged 18 or older with them. For treatment visits, patients cannot have anyone with them due to current Covid guidelines and our immunocompromised population.  

## 2021-04-09 DIAGNOSIS — I2584 Coronary atherosclerosis due to calcified coronary lesion: Secondary | ICD-10-CM | POA: Diagnosis not present

## 2021-04-09 DIAGNOSIS — E119 Type 2 diabetes mellitus without complications: Secondary | ICD-10-CM | POA: Diagnosis not present

## 2021-04-09 DIAGNOSIS — I4891 Unspecified atrial fibrillation: Secondary | ICD-10-CM | POA: Diagnosis not present

## 2021-04-09 DIAGNOSIS — I2510 Atherosclerotic heart disease of native coronary artery without angina pectoris: Secondary | ICD-10-CM | POA: Diagnosis not present

## 2021-04-09 DIAGNOSIS — I10 Essential (primary) hypertension: Secondary | ICD-10-CM | POA: Diagnosis not present

## 2021-04-09 DIAGNOSIS — I129 Hypertensive chronic kidney disease with stage 1 through stage 4 chronic kidney disease, or unspecified chronic kidney disease: Secondary | ICD-10-CM | POA: Diagnosis not present

## 2021-04-09 DIAGNOSIS — E7800 Pure hypercholesterolemia, unspecified: Secondary | ICD-10-CM | POA: Diagnosis not present

## 2021-04-09 DIAGNOSIS — M0609 Rheumatoid arthritis without rheumatoid factor, multiple sites: Secondary | ICD-10-CM | POA: Diagnosis not present

## 2021-04-14 ENCOUNTER — Other Ambulatory Visit: Payer: Self-pay | Admitting: Oncology

## 2021-04-14 ENCOUNTER — Inpatient Hospital Stay: Payer: Medicare HMO

## 2021-04-14 VITALS — BP 145/92 | HR 102 | Temp 98.9°F | Resp 18

## 2021-04-14 DIAGNOSIS — C9000 Multiple myeloma not having achieved remission: Secondary | ICD-10-CM

## 2021-04-14 DIAGNOSIS — Z5112 Encounter for antineoplastic immunotherapy: Secondary | ICD-10-CM | POA: Diagnosis not present

## 2021-04-14 LAB — CBC WITH DIFFERENTIAL (CANCER CENTER ONLY)
Abs Immature Granulocytes: 0.02 10*3/uL (ref 0.00–0.07)
Basophils Absolute: 0 10*3/uL (ref 0.0–0.1)
Basophils Relative: 0 %
Eosinophils Absolute: 0 10*3/uL (ref 0.0–0.5)
Eosinophils Relative: 0 %
HCT: 30.7 % — ABNORMAL LOW (ref 39.0–52.0)
Hemoglobin: 10.9 g/dL — ABNORMAL LOW (ref 13.0–17.0)
Immature Granulocytes: 0 %
Lymphocytes Relative: 5 %
Lymphs Abs: 0.3 10*3/uL — ABNORMAL LOW (ref 0.7–4.0)
MCH: 34.3 pg — ABNORMAL HIGH (ref 26.0–34.0)
MCHC: 35.5 g/dL (ref 30.0–36.0)
MCV: 96.5 fL (ref 80.0–100.0)
Monocytes Absolute: 0.2 10*3/uL (ref 0.1–1.0)
Monocytes Relative: 4 %
Neutro Abs: 5.1 10*3/uL (ref 1.7–7.7)
Neutrophils Relative %: 91 %
Platelet Count: 100 10*3/uL — ABNORMAL LOW (ref 150–400)
RBC: 3.18 MIL/uL — ABNORMAL LOW (ref 4.22–5.81)
RDW: 16.4 % — ABNORMAL HIGH (ref 11.5–15.5)
WBC Count: 5.7 10*3/uL (ref 4.0–10.5)
nRBC: 0 % (ref 0.0–0.2)

## 2021-04-14 LAB — CMP (CANCER CENTER ONLY)
ALT: 11 U/L (ref 0–44)
AST: 10 U/L — ABNORMAL LOW (ref 15–41)
Albumin: 3.6 g/dL (ref 3.5–5.0)
Alkaline Phosphatase: 37 U/L — ABNORMAL LOW (ref 38–126)
Anion gap: 12 (ref 5–15)
BUN: 18 mg/dL (ref 8–23)
CO2: 19 mmol/L — ABNORMAL LOW (ref 22–32)
Calcium: 8.9 mg/dL (ref 8.9–10.3)
Chloride: 94 mmol/L — ABNORMAL LOW (ref 98–111)
Creatinine: 1.29 mg/dL — ABNORMAL HIGH (ref 0.61–1.24)
GFR, Estimated: 56 mL/min — ABNORMAL LOW (ref >60)
Glucose, Bld: 204 mg/dL — ABNORMAL HIGH (ref 70–99)
Potassium: 4.3 mmol/L (ref 3.5–5.1)
Sodium: 125 mmol/L — ABNORMAL LOW (ref 135–145)
Total Bilirubin: 1.1 mg/dL (ref 0.3–1.2)
Total Protein: 9.1 g/dL — ABNORMAL HIGH (ref 6.5–8.1)

## 2021-04-14 MED ORDER — DIPHENHYDRAMINE HCL 25 MG PO CAPS
50.0000 mg | ORAL_CAPSULE | Freq: Once | ORAL | Status: AC
  Administered 2021-04-14: 14:00:00 50 mg via ORAL
  Filled 2021-04-14: qty 2

## 2021-04-14 MED ORDER — DEXAMETHASONE 4 MG PO TABS
20.0000 mg | ORAL_TABLET | Freq: Once | ORAL | Status: DC
  Filled 2021-04-14: qty 5

## 2021-04-14 MED ORDER — ACETAMINOPHEN 325 MG PO TABS
650.0000 mg | ORAL_TABLET | Freq: Once | ORAL | Status: AC
  Administered 2021-04-14: 14:00:00 650 mg via ORAL
  Filled 2021-04-14: qty 2

## 2021-04-14 MED ORDER — DARATUMUMAB-HYALURONIDASE-FIHJ 1800-30000 MG-UT/15ML ~~LOC~~ SOLN
1800.0000 mg | Freq: Once | SUBCUTANEOUS | Status: AC
  Administered 2021-04-14: 15:00:00 1800 mg via SUBCUTANEOUS
  Filled 2021-04-14: qty 15

## 2021-04-14 NOTE — Patient Instructions (Signed)
[facility]  Discharge Instructions: Thank you for choosing [facility] to provide your oncology and hematology care.   If you have a lab appointment with the Cancer Center, please go directly to the Cancer Center and check in at the registration area.   Wear comfortable clothing and clothing appropriate for easy access to any Portacath or PICC line.   We strive to give you quality time with your provider. You may need to reschedule your appointment if you arrive late (15 or more minutes).  Arriving late affects you and other patients whose appointments are after yours.  Also, if you miss three or more appointments without notifying the office, you may be dismissed from the clinic at the provider's discretion.      For prescription refill requests, have your pharmacy contact our office and allow 72 hours for refills to be completed.    Today you received the following chemotherapy and/or immunotherapy agents: Darzalex Faspro      To help prevent nausea and vomiting after your treatment, we encourage you to take your nausea medication as directed.  BELOW ARE SYMPTOMS THAT SHOULD BE REPORTED IMMEDIATELY: *FEVER GREATER THAN 100.4 F (38 C) OR HIGHER *CHILLS OR SWEATING *NAUSEA AND VOMITING THAT IS NOT CONTROLLED WITH YOUR NAUSEA MEDICATION *UNUSUAL SHORTNESS OF BREATH *UNUSUAL BRUISING OR BLEEDING *URINARY PROBLEMS (pain or burning when urinating, or frequent urination) *BOWEL PROBLEMS (unusual diarrhea, constipation, pain near the anus) TENDERNESS IN MOUTH AND THROAT WITH OR WITHOUT PRESENCE OF ULCERS (sore throat, sores in mouth, or a toothache) UNUSUAL RASH, SWELLING OR PAIN  UNUSUAL VAGINAL DISCHARGE OR ITCHING   Items with * indicate a potential emergency and should be followed up as soon as possible or go to the Emergency Department if any problems should occur.  Please show the CHEMOTHERAPY ALERT CARD or IMMUNOTHERAPY ALERT CARD at  check-in to the Emergency Department and triage nurse.  Should you have questions after your visit or need to cancel or reschedule your appointment, please contact [facility]  Dept: [phone]  and follow the prompts.  Office hours are 8:00 a.m. to 4:30 p.m. Monday - Friday. Please note that voicemails left after 4:00 p.m. may not be returned until the following business day.  We are closed weekends and major holidays. You have access to a nurse at all times for urgent questions. Please call the main number to the clinic Dept: [phone] and follow the prompts.   For any non-urgent questions, you may also contact your provider using MyChart. We now offer e-Visits for anyone 18 and older to request care online for non-urgent symptoms. For details visit [email or web address].   Also download the MyChart app! Go to the app store, search "MyChart", open the app, select [email or web address], and log in with your MyChart username and password.  Due to Covid, a mask is required upon entering the hospital/clinic. If you do not have a mask, one will be given to you upon arrival. For doctor visits, patients may have 1 support person aged 18 or older with them. For treatment visits, patients cannot have anyone with them due to current Covid guidelines and our immunocompromised population.  

## 2021-04-14 NOTE — Progress Notes (Signed)
Per Dr. Alen Blew ok to treat with tachycardia today.

## 2021-04-15 LAB — KAPPA/LAMBDA LIGHT CHAINS
Kappa free light chain: 4.1 mg/L (ref 3.3–19.4)
Kappa, lambda light chain ratio: 0.01 — ABNORMAL LOW (ref 0.26–1.65)
Lambda free light chains: 413.6 mg/L — ABNORMAL HIGH (ref 5.7–26.3)

## 2021-04-16 DIAGNOSIS — R000 Tachycardia, unspecified: Secondary | ICD-10-CM | POA: Diagnosis not present

## 2021-04-16 DIAGNOSIS — I10 Essential (primary) hypertension: Secondary | ICD-10-CM | POA: Diagnosis not present

## 2021-04-16 DIAGNOSIS — J984 Other disorders of lung: Secondary | ICD-10-CM | POA: Diagnosis not present

## 2021-04-16 DIAGNOSIS — J40 Bronchitis, not specified as acute or chronic: Secondary | ICD-10-CM | POA: Diagnosis not present

## 2021-04-16 DIAGNOSIS — I129 Hypertensive chronic kidney disease with stage 1 through stage 4 chronic kidney disease, or unspecified chronic kidney disease: Secondary | ICD-10-CM | POA: Diagnosis not present

## 2021-04-16 DIAGNOSIS — M1990 Unspecified osteoarthritis, unspecified site: Secondary | ICD-10-CM | POA: Diagnosis not present

## 2021-04-16 DIAGNOSIS — R531 Weakness: Secondary | ICD-10-CM | POA: Diagnosis not present

## 2021-04-16 DIAGNOSIS — J849 Interstitial pulmonary disease, unspecified: Secondary | ICD-10-CM | POA: Diagnosis not present

## 2021-04-16 DIAGNOSIS — E1122 Type 2 diabetes mellitus with diabetic chronic kidney disease: Secondary | ICD-10-CM | POA: Diagnosis not present

## 2021-04-16 DIAGNOSIS — N1831 Chronic kidney disease, stage 3a: Secondary | ICD-10-CM | POA: Diagnosis not present

## 2021-04-16 DIAGNOSIS — R059 Cough, unspecified: Secondary | ICD-10-CM | POA: Diagnosis not present

## 2021-04-16 DIAGNOSIS — I517 Cardiomegaly: Secondary | ICD-10-CM | POA: Diagnosis not present

## 2021-04-16 DIAGNOSIS — E871 Hypo-osmolality and hyponatremia: Secondary | ICD-10-CM | POA: Diagnosis not present

## 2021-04-16 DIAGNOSIS — C9000 Multiple myeloma not having achieved remission: Secondary | ICD-10-CM | POA: Diagnosis not present

## 2021-04-16 DIAGNOSIS — I4891 Unspecified atrial fibrillation: Secondary | ICD-10-CM | POA: Diagnosis not present

## 2021-04-17 DIAGNOSIS — E871 Hypo-osmolality and hyponatremia: Secondary | ICD-10-CM | POA: Diagnosis not present

## 2021-04-17 DIAGNOSIS — J157 Pneumonia due to Mycoplasma pneumoniae: Secondary | ICD-10-CM | POA: Diagnosis not present

## 2021-04-17 DIAGNOSIS — E119 Type 2 diabetes mellitus without complications: Secondary | ICD-10-CM | POA: Diagnosis not present

## 2021-04-17 DIAGNOSIS — I4891 Unspecified atrial fibrillation: Secondary | ICD-10-CM | POA: Diagnosis not present

## 2021-04-17 DIAGNOSIS — R531 Weakness: Secondary | ICD-10-CM | POA: Diagnosis not present

## 2021-04-17 DIAGNOSIS — I080 Rheumatic disorders of both mitral and aortic valves: Secondary | ICD-10-CM | POA: Diagnosis not present

## 2021-04-17 DIAGNOSIS — C9000 Multiple myeloma not having achieved remission: Secondary | ICD-10-CM | POA: Diagnosis not present

## 2021-04-17 DIAGNOSIS — I10 Essential (primary) hypertension: Secondary | ICD-10-CM | POA: Diagnosis not present

## 2021-04-17 DIAGNOSIS — N1831 Chronic kidney disease, stage 3a: Secondary | ICD-10-CM | POA: Diagnosis not present

## 2021-04-17 DIAGNOSIS — D849 Immunodeficiency, unspecified: Secondary | ICD-10-CM | POA: Diagnosis not present

## 2021-04-18 DIAGNOSIS — C9000 Multiple myeloma not having achieved remission: Secondary | ICD-10-CM | POA: Diagnosis not present

## 2021-04-18 DIAGNOSIS — E871 Hypo-osmolality and hyponatremia: Secondary | ICD-10-CM | POA: Diagnosis not present

## 2021-04-18 DIAGNOSIS — I4891 Unspecified atrial fibrillation: Secondary | ICD-10-CM | POA: Diagnosis not present

## 2021-04-18 DIAGNOSIS — J849 Interstitial pulmonary disease, unspecified: Secondary | ICD-10-CM | POA: Diagnosis not present

## 2021-04-18 DIAGNOSIS — I10 Essential (primary) hypertension: Secondary | ICD-10-CM | POA: Diagnosis not present

## 2021-04-19 DIAGNOSIS — C9000 Multiple myeloma not having achieved remission: Secondary | ICD-10-CM | POA: Diagnosis not present

## 2021-04-19 DIAGNOSIS — I10 Essential (primary) hypertension: Secondary | ICD-10-CM | POA: Diagnosis not present

## 2021-04-19 DIAGNOSIS — J849 Interstitial pulmonary disease, unspecified: Secondary | ICD-10-CM | POA: Diagnosis not present

## 2021-04-19 DIAGNOSIS — I4891 Unspecified atrial fibrillation: Secondary | ICD-10-CM | POA: Diagnosis not present

## 2021-04-19 DIAGNOSIS — E871 Hypo-osmolality and hyponatremia: Secondary | ICD-10-CM | POA: Diagnosis not present

## 2021-04-19 LAB — MULTIPLE MYELOMA PANEL, SERUM
Albumin SerPl Elph-Mcnc: 3.5 g/dL (ref 2.9–4.4)
Albumin/Glob SerPl: 0.7 (ref 0.7–1.7)
Alpha 1: 0.3 g/dL (ref 0.0–0.4)
Alpha2 Glob SerPl Elph-Mcnc: 0.8 g/dL (ref 0.4–1.0)
B-Globulin SerPl Elph-Mcnc: 0.8 g/dL (ref 0.7–1.3)
Gamma Glob SerPl Elph-Mcnc: 3.1 g/dL — ABNORMAL HIGH (ref 0.4–1.8)
Globulin, Total: 5.1 g/dL — ABNORMAL HIGH (ref 2.2–3.9)
IgA: 2604 mg/dL — ABNORMAL HIGH (ref 61–437)
IgG (Immunoglobin G), Serum: 354 mg/dL — ABNORMAL LOW (ref 603–1613)
IgM (Immunoglobulin M), Srm: <5 mg/dL — ABNORMAL LOW (ref 15–143)
M Protein SerPl Elph-Mcnc: 2.3 g/dL — ABNORMAL HIGH
Total Protein ELP: 8.6 g/dL — ABNORMAL HIGH (ref 6.0–8.5)

## 2021-04-20 ENCOUNTER — Telehealth: Payer: Self-pay | Admitting: *Deleted

## 2021-04-20 DIAGNOSIS — I2510 Atherosclerotic heart disease of native coronary artery without angina pectoris: Secondary | ICD-10-CM | POA: Diagnosis not present

## 2021-04-20 DIAGNOSIS — J849 Interstitial pulmonary disease, unspecified: Secondary | ICD-10-CM | POA: Diagnosis not present

## 2021-04-20 DIAGNOSIS — E1122 Type 2 diabetes mellitus with diabetic chronic kidney disease: Secondary | ICD-10-CM | POA: Diagnosis not present

## 2021-04-20 DIAGNOSIS — E871 Hypo-osmolality and hyponatremia: Secondary | ICD-10-CM | POA: Diagnosis not present

## 2021-04-20 DIAGNOSIS — D630 Anemia in neoplastic disease: Secondary | ICD-10-CM | POA: Diagnosis not present

## 2021-04-20 DIAGNOSIS — N1831 Chronic kidney disease, stage 3a: Secondary | ICD-10-CM | POA: Diagnosis not present

## 2021-04-20 DIAGNOSIS — J40 Bronchitis, not specified as acute or chronic: Secondary | ICD-10-CM | POA: Diagnosis not present

## 2021-04-20 DIAGNOSIS — C9000 Multiple myeloma not having achieved remission: Secondary | ICD-10-CM | POA: Diagnosis not present

## 2021-04-20 DIAGNOSIS — I1310 Hypertensive heart and chronic kidney disease without heart failure, with stage 1 through stage 4 chronic kidney disease, or unspecified chronic kidney disease: Secondary | ICD-10-CM | POA: Diagnosis not present

## 2021-04-20 NOTE — Telephone Encounter (Signed)
-----   Message from Wyatt Portela, MD sent at 04/20/2021  9:03 AM EST ----- Please let him know his M protein is down

## 2021-04-20 NOTE — Telephone Encounter (Signed)
Per Dr.Shadad, called pt wife and made aware of message below. Pt wife was appreciative and verbalized understanding.

## 2021-04-21 ENCOUNTER — Inpatient Hospital Stay: Payer: Medicare HMO

## 2021-04-21 ENCOUNTER — Other Ambulatory Visit: Payer: Self-pay

## 2021-04-21 ENCOUNTER — Inpatient Hospital Stay: Payer: Medicare HMO | Attending: Oncology | Admitting: Oncology

## 2021-04-21 VITALS — BP 141/79 | HR 87 | Temp 97.7°F | Resp 19 | Ht 70.0 in | Wt 163.4 lb

## 2021-04-21 DIAGNOSIS — Z5112 Encounter for antineoplastic immunotherapy: Secondary | ICD-10-CM | POA: Diagnosis not present

## 2021-04-21 DIAGNOSIS — C9000 Multiple myeloma not having achieved remission: Secondary | ICD-10-CM | POA: Diagnosis not present

## 2021-04-21 NOTE — Progress Notes (Signed)
Hematology and Oncology Follow Up Visit  Devin [name] [id number] [birth year] 81 y.o. 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  He was found to have 3 q. deletion, duplication 1 q, (72;53) and bone involvement and 50% plasma cell infiltration in the bone marrow.  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   Prior Therapy:   He status post right robotic laparoscopic partial nephrectomy on October 03, 2016.  Palliative radiation therapy to the spine.  He completed 20 Gy in 10 fractions on March 24, 2020.  Velcade and dexamethasone started on January 7 to be given weekly.  Last cycle given on 09/11/2020  Revlimid 10 mg po daily for 21 days with dexamethasone 20 mg weekly started on 09/22/2020.     Current therapy:   Darzalex faspro 1800 mg weekly started on February 17, 2021.   He will receive treatment every other week per protocol starting on April 28, 2021.  Zometa 4 mg every 3 months.  Last treatment given on December 14 and will be repeated in March 2023.    Interim History: [name] presents today for repeat follow-up.  Since the last visit, he was hospitalized briefly due to pneumonia and atrial fibrillation which is currently improving at this time.  He had denies any fevers or chills but does report some occasional cough and dyspnea.  He denies any worsening bone pain or pathological fractures.    Medications: Reviewed without changes. Current Outpatient Medications  Medication Sig Dispense Refill   acetaminophen (TYLENOL) 500 MG tablet Take 1,000 mg by mouth daily as needed for mild pain or moderate pain.     acyclovir (ZOVIRAX) 400 MG tablet Take 1 tablet (400 mg total) by mouth daily. 90 tablet 3   allopurinol (ZYLOPRIM) 300 MG tablet Take 300 mg by mouth daily.      amLODipine  (NORVASC) 10 MG tablet Take 10 mg by mouth daily.     ASPIRIN LOW DOSE 81 MG EC tablet Take 81 mg by mouth daily.     Calcium Carb-Cholecalciferol (OYSTER SHELL CALCIUM W/D) 500-5 MG-MCG TABS TAKE 1 TABLET BY MOUTH TWICE A DAY 180 tablet 1   calcium-vitamin D (OSCAL WITH D) 500-200 MG-UNIT tablet Take 1 tablet by mouth 2 (two) times daily. 60 tablet 3   dexamethasone (DECADRON) 4 MG tablet Take 5 tablets weekly on the day of cancer treatment. 60 tablet 3   folic acid (FOLVITE) 1 MG tablet Take 1 mg by mouth daily.     furosemide (LASIX) 20 MG tablet Take 20 mg by mouth 2 (two) times daily.     labetalol (NORMODYNE) 300 MG tablet Take 300 mg by mouth 2 (two) times daily.     losartan (COZAAR) 100 MG tablet Take 100 mg by mouth daily.     morphine (MS CONTIN) 30 MG 12 hr tablet Take 1 tablet (30 mg total) by mouth every 12 (twelve) hours. 60 tablet 0   morphine (MSIR) 15 MG tablet Take 1 tablet (15 mg total) by mouth every 4 (four) hours as needed for severe pain. 60 tablet 0   NARCAN 4 MG/0.1ML LIQD nasal spray kit Place 1 spray into the nose once.     polyethylene glycol (MIRALAX / GLYCOLAX) 17 g packet Take 17 g by mouth daily as needed for moderate constipation. 14 each 0   rosuvastatin (  CRESTOR) 5 MG tablet Take 5 mg by mouth daily.     senna-docusate (SENOKOT-S) 8.6-50 MG tablet Take 1 tablet by mouth at bedtime. 100 tablet 0   Vitamin D, Ergocalciferol, (DRISDOL) 1.25 MG (50000 UNIT) CAPS capsule Take 50,000 Units by mouth every 14 (fourteen) days.     No current facility-administered medications for this visit.     Allergies:  Allergies  Allergen Reactions   Amlodipine Swelling   Pravastatin Other (See Comments)    Joint pain   Zestril [Lisinopril] Other (See Comments)    Gout   ( Zestril) gout   Other Other (See Comments)   Oxycodone Other (See Comments)    Causes agitation       Physical Exam:    Blood pressure (!) 141/79, pulse 87, temperature 97.7 F (36.5 C),  temperature source Temporal, resp. rate 19, height 5' 10"  (1.778 m), weight 163 lb 6.4 oz (74.1 kg), SpO2 100 %.          ECOG:  1    General appearance: Alert, awake without any distress. Head: Atraumatic without abnormalities Oropharynx: Without any thrush or ulcers. Eyes: No scleral icterus. Lymph nodes: No lymphadenopathy noted in the cervical, supraclavicular, or axillary nodes Heart:regular rate and rhythm, without any murmurs or gallops.   Lung: Clear to auscultation without any rhonchi, wheezes or dullness to percussion. Abdomin: Soft, nontender without any shifting dullness or ascites. Musculoskeletal: No clubbing or cyanosis. Neurological: No motor or sensory deficits. Skin: No rashes or lesions.              Lab Results: Lab Results  Component Value Date   WBC 5.7 04/14/2021   HGB 10.9 (L) 04/14/2021   HCT 30.7 (L) 04/14/2021   MCV 96.5 04/14/2021   PLT 100 (L) 04/14/2021     Chemistry      Component Value Date/Time   NA 125 (L) 04/14/2021 1327   K 4.3 04/14/2021 1327   CL 94 (L) 04/14/2021 1327   CO2 19 (L) 04/14/2021 1327   BUN 18 04/14/2021 1327   CREATININE 1.29 (H) 04/14/2021 1327      Component Value Date/Time   CALCIUM 8.9 04/14/2021 1327   ALKPHOS 37 (L) 04/14/2021 1327   AST 10 (L) 04/14/2021 1327   ALT 11 04/14/2021 1327   BILITOT 1.1 04/14/2021 1327         Latest Reference Range & Units 03/24/21 07:42 03/31/21 13:52 04/14/21 14:37  M Protein SerPl Elph-Mcnc Not Observed g/dL 3.0 (H) (C) 3.1 (H) (C) 2.3 (H) (C)  (H): Data is abnormally high (C): Corrected   Impression and Plan:  81 year old with:  1.  Multiple myeloma diagnosed in December 2021.  He was found to have IgA lambda subtype.  He is currently on daratumumab which I recommended continuing.  He will proceed with every other week treatment per protocol.  Complications include nausea, fatigue and infusion related reactions.  Additional therapy for multiple  myeloma were discussed but given his overall fragile health I recommended with continuing with single agent at this time.   2.  Chronic pain: He continues to be on long-acting morphine with pain is manageable.    3.  Antiemetics: Compazine is available to her without any nausea or vomiting.   4.  Hypocalcemia: Calcium has normalized at this time and will continue to monitor on subsequent visits.  5.  VZV prophylaxis: I recommended continuing acyclovir for the time being.   6.  Bone directed therapy: Will receive  Zometa in March 2023 and repeated every 3 months.  7.  Follow-up: He will continue to receive daratumumab every other week and MD follow-up in 6 weeks.   30  minutes were spent on this encounter.  Time was spent on reviewing laboratory data, disease status update and outlining future plan of care.   Zola Button, MD 2/1/202312:53 PM

## 2021-04-23 DIAGNOSIS — Z79899 Other long term (current) drug therapy: Secondary | ICD-10-CM | POA: Diagnosis not present

## 2021-04-23 DIAGNOSIS — I4891 Unspecified atrial fibrillation: Secondary | ICD-10-CM | POA: Diagnosis not present

## 2021-04-23 DIAGNOSIS — R32 Unspecified urinary incontinence: Secondary | ICD-10-CM | POA: Diagnosis not present

## 2021-04-23 DIAGNOSIS — J189 Pneumonia, unspecified organism: Secondary | ICD-10-CM | POA: Diagnosis not present

## 2021-04-23 DIAGNOSIS — M1009 Idiopathic gout, multiple sites: Secondary | ICD-10-CM | POA: Diagnosis not present

## 2021-04-28 ENCOUNTER — Inpatient Hospital Stay: Payer: Medicare HMO

## 2021-04-28 ENCOUNTER — Other Ambulatory Visit: Payer: Self-pay

## 2021-04-28 VITALS — BP 136/78 | HR 61 | Temp 98.4°F | Resp 18 | Wt 163.0 lb

## 2021-04-28 DIAGNOSIS — C9000 Multiple myeloma not having achieved remission: Secondary | ICD-10-CM

## 2021-04-28 DIAGNOSIS — Z5112 Encounter for antineoplastic immunotherapy: Secondary | ICD-10-CM | POA: Diagnosis not present

## 2021-04-28 LAB — CMP (CANCER CENTER ONLY)
ALT: 15 U/L (ref 0–44)
AST: 10 U/L — ABNORMAL LOW (ref 15–41)
Albumin: 3.4 g/dL — ABNORMAL LOW (ref 3.5–5.0)
Alkaline Phosphatase: 35 U/L — ABNORMAL LOW (ref 38–126)
Anion gap: 8 (ref 5–15)
BUN: 15 mg/dL (ref 8–23)
CO2: 22 mmol/L (ref 22–32)
Calcium: 8.5 mg/dL — ABNORMAL LOW (ref 8.9–10.3)
Chloride: 98 mmol/L (ref 98–111)
Creatinine: 1.06 mg/dL (ref 0.61–1.24)
GFR, Estimated: >60 mL/min (ref >60)
Glucose, Bld: 150 mg/dL — ABNORMAL HIGH (ref 70–99)
Potassium: 4.3 mmol/L (ref 3.5–5.1)
Sodium: 128 mmol/L — ABNORMAL LOW (ref 135–145)
Total Bilirubin: 0.5 mg/dL (ref 0.3–1.2)
Total Protein: 7.1 g/dL (ref 6.5–8.1)

## 2021-04-28 LAB — CBC WITH DIFFERENTIAL (CANCER CENTER ONLY)
Abs Immature Granulocytes: 0.01 10*3/uL (ref 0.00–0.07)
Basophils Absolute: 0 10*3/uL (ref 0.0–0.1)
Basophils Relative: 0 %
Eosinophils Absolute: 0.1 10*3/uL (ref 0.0–0.5)
Eosinophils Relative: 1 %
HCT: 27.7 % — ABNORMAL LOW (ref 39.0–52.0)
Hemoglobin: 9.7 g/dL — ABNORMAL LOW (ref 13.0–17.0)
Immature Granulocytes: 0 %
Lymphocytes Relative: 16 %
Lymphs Abs: 0.7 10*3/uL (ref 0.7–4.0)
MCH: 34.3 pg — ABNORMAL HIGH (ref 26.0–34.0)
MCHC: 35 g/dL (ref 30.0–36.0)
MCV: 97.9 fL (ref 80.0–100.0)
Monocytes Absolute: 0.3 10*3/uL (ref 0.1–1.0)
Monocytes Relative: 6 %
Neutro Abs: 3.5 10*3/uL (ref 1.7–7.7)
Neutrophils Relative %: 77 %
Platelet Count: 106 10*3/uL — ABNORMAL LOW (ref 150–400)
RBC: 2.83 MIL/uL — ABNORMAL LOW (ref 4.22–5.81)
RDW: 15.5 % (ref 11.5–15.5)
WBC Count: 4.5 10*3/uL (ref 4.0–10.5)
nRBC: 0 % (ref 0.0–0.2)

## 2021-04-28 MED ORDER — DIPHENHYDRAMINE HCL 25 MG PO CAPS
50.0000 mg | ORAL_CAPSULE | Freq: Once | ORAL | Status: AC
  Administered 2021-04-28: 50 mg via ORAL
  Filled 2021-04-28: qty 2

## 2021-04-28 MED ORDER — DARATUMUMAB-HYALURONIDASE-FIHJ 1800-30000 MG-UT/15ML ~~LOC~~ SOLN
1800.0000 mg | Freq: Once | SUBCUTANEOUS | Status: AC
  Administered 2021-04-28: 1800 mg via SUBCUTANEOUS
  Filled 2021-04-28: qty 15

## 2021-04-28 MED ORDER — DEXAMETHASONE 4 MG PO TABS
20.0000 mg | ORAL_TABLET | Freq: Once | ORAL | Status: DC
  Filled 2021-04-28: qty 5

## 2021-04-28 MED ORDER — ACETAMINOPHEN 325 MG PO TABS
650.0000 mg | ORAL_TABLET | Freq: Once | ORAL | Status: AC
  Administered 2021-04-28: 650 mg via ORAL
  Filled 2021-04-28: qty 2

## 2021-04-28 NOTE — Progress Notes (Signed)
Patient took 20mg  of Decadron at home today at noon. Decadron released from treatment plan but not administered to patient.

## 2021-04-28 NOTE — Patient Instructions (Signed)
[facility]  Discharge Instructions: Thank you for choosing [facility] to provide your oncology and hematology care.   If you have a lab appointment with the Cancer Center, please go directly to the Cancer Center and check in at the registration area.   Wear comfortable clothing and clothing appropriate for easy access to any Portacath or PICC line.   We strive to give you quality time with your provider. You may need to reschedule your appointment if you arrive late (15 or more minutes).  Arriving late affects you and other patients whose appointments are after yours.  Also, if you miss three or more appointments without notifying the office, you may be dismissed from the clinic at the provider's discretion.      For prescription refill requests, have your pharmacy contact our office and allow 72 hours for refills to be completed.    Today you received the following chemotherapy and/or immunotherapy agents: Darzalex Faspro      To help prevent nausea and vomiting after your treatment, we encourage you to take your nausea medication as directed.  BELOW ARE SYMPTOMS THAT SHOULD BE REPORTED IMMEDIATELY: *FEVER GREATER THAN 100.4 F (38 C) OR HIGHER *CHILLS OR SWEATING *NAUSEA AND VOMITING THAT IS NOT CONTROLLED WITH YOUR NAUSEA MEDICATION *UNUSUAL SHORTNESS OF BREATH *UNUSUAL BRUISING OR BLEEDING *URINARY PROBLEMS (pain or burning when urinating, or frequent urination) *BOWEL PROBLEMS (unusual diarrhea, constipation, pain near the anus) TENDERNESS IN MOUTH AND THROAT WITH OR WITHOUT PRESENCE OF ULCERS (sore throat, sores in mouth, or a toothache) UNUSUAL RASH, SWELLING OR PAIN  UNUSUAL VAGINAL DISCHARGE OR ITCHING   Items with * indicate a potential emergency and should be followed up as soon as possible or go to the Emergency Department if any problems should occur.  Please show the CHEMOTHERAPY ALERT CARD or IMMUNOTHERAPY ALERT CARD at  check-in to the Emergency Department and triage nurse.  Should you have questions after your visit or need to cancel or reschedule your appointment, please contact [facility]  Dept: [phone]  and follow the prompts.  Office hours are 8:00 a.m. to 4:30 p.m. Monday - Friday. Please note that voicemails left after 4:00 p.m. may not be returned until the following business day.  We are closed weekends and major holidays. You have access to a nurse at all times for urgent questions. Please call the main number to the clinic Dept: [phone] and follow the prompts.   For any non-urgent questions, you may also contact your provider using MyChart. We now offer e-Visits for anyone 18 and older to request care online for non-urgent symptoms. For details visit [email or web address].   Also download the MyChart app! Go to the app store, search "MyChart", open the app, select [facility], and log in with your MyChart username and password.  Due to Covid, a mask is required upon entering the hospital/clinic. If you do not have a mask, one will be given to you upon arrival. For doctor visits, patients may have 1 support person aged 18 or older with them. For treatment visits, patients cannot have anyone with them due to current Covid guidelines and our immunocompromised population.  

## 2021-05-05 ENCOUNTER — Other Ambulatory Visit: Payer: Medicare HMO

## 2021-05-05 ENCOUNTER — Ambulatory Visit: Payer: Medicare HMO

## 2021-05-06 ENCOUNTER — Telehealth: Payer: Self-pay

## 2021-05-06 NOTE — Telephone Encounter (Signed)
Pt's wife called. Refills needed on MS Contin and MSIR. Last filled 03/23/21.   Message routed to Dr. Julien Nordmann.

## 2021-05-07 ENCOUNTER — Other Ambulatory Visit: Payer: Self-pay | Admitting: Hematology & Oncology

## 2021-05-07 DIAGNOSIS — C649 Malignant neoplasm of unspecified kidney, except renal pelvis: Secondary | ICD-10-CM

## 2021-05-07 MED ORDER — MORPHINE SULFATE 15 MG PO TABS: 15.0000 mg | ORAL_TABLET | ORAL | 0 refills | Status: DC | PRN

## 2021-05-07 MED ORDER — MORPHINE SULFATE ER 30 MG PO TBCR: 30.0000 mg | EXTENDED_RELEASE_TABLET | Freq: Two times a day (BID) | ORAL | 0 refills | Status: DC

## 2021-05-10 ENCOUNTER — Other Ambulatory Visit: Payer: Self-pay | Admitting: Oncology

## 2021-05-10 DIAGNOSIS — C649 Malignant neoplasm of unspecified kidney, except renal pelvis: Secondary | ICD-10-CM

## 2021-05-10 DIAGNOSIS — N1831 Chronic kidney disease, stage 3a: Secondary | ICD-10-CM | POA: Diagnosis not present

## 2021-05-10 DIAGNOSIS — I10 Essential (primary) hypertension: Secondary | ICD-10-CM | POA: Diagnosis not present

## 2021-05-10 DIAGNOSIS — E7800 Pure hypercholesterolemia, unspecified: Secondary | ICD-10-CM | POA: Diagnosis not present

## 2021-05-10 DIAGNOSIS — N183 Chronic kidney disease, stage 3 (moderate): Secondary | ICD-10-CM | POA: Diagnosis not present

## 2021-05-10 DIAGNOSIS — E119 Type 2 diabetes mellitus without complications: Secondary | ICD-10-CM | POA: Diagnosis not present

## 2021-05-12 ENCOUNTER — Other Ambulatory Visit: Payer: Self-pay

## 2021-05-12 ENCOUNTER — Inpatient Hospital Stay: Payer: Medicare HMO

## 2021-05-12 VITALS — BP 122/73 | HR 70 | Temp 98.5°F | Resp 18 | Wt 169.8 lb

## 2021-05-12 DIAGNOSIS — C9000 Multiple myeloma not having achieved remission: Secondary | ICD-10-CM

## 2021-05-12 DIAGNOSIS — Z5112 Encounter for antineoplastic immunotherapy: Secondary | ICD-10-CM | POA: Diagnosis not present

## 2021-05-12 LAB — CBC WITH DIFFERENTIAL (CANCER CENTER ONLY)
Abs Immature Granulocytes: 0.01 10*3/uL (ref 0.00–0.07)
Basophils Absolute: 0 10*3/uL (ref 0.0–0.1)
Basophils Relative: 0 %
Eosinophils Absolute: 0.2 10*3/uL (ref 0.0–0.5)
Eosinophils Relative: 3 %
HCT: 28 % — ABNORMAL LOW (ref 39.0–52.0)
Hemoglobin: 10.1 g/dL — ABNORMAL LOW (ref 13.0–17.0)
Immature Granulocytes: 0 %
Lymphocytes Relative: 25 %
Lymphs Abs: 1.2 10*3/uL (ref 0.7–4.0)
MCH: 35.2 pg — ABNORMAL HIGH (ref 26.0–34.0)
MCHC: 36.1 g/dL — ABNORMAL HIGH (ref 30.0–36.0)
MCV: 97.6 fL (ref 80.0–100.0)
Monocytes Absolute: 0.2 10*3/uL (ref 0.1–1.0)
Monocytes Relative: 4 %
Neutro Abs: 3.3 10*3/uL (ref 1.7–7.7)
Neutrophils Relative %: 68 %
Platelet Count: 113 10*3/uL — ABNORMAL LOW (ref 150–400)
RBC: 2.87 MIL/uL — ABNORMAL LOW (ref 4.22–5.81)
RDW: 15.1 % (ref 11.5–15.5)
WBC Count: 4.9 10*3/uL (ref 4.0–10.5)
nRBC: 0 % (ref 0.0–0.2)

## 2021-05-12 LAB — CMP (CANCER CENTER ONLY)
ALT: 11 U/L (ref 0–44)
AST: 11 U/L — ABNORMAL LOW (ref 15–41)
Albumin: 3.6 g/dL (ref 3.5–5.0)
Alkaline Phosphatase: 39 U/L (ref 38–126)
Anion gap: 10 (ref 5–15)
BUN: 13 mg/dL (ref 8–23)
CO2: 22 mmol/L (ref 22–32)
Calcium: 8.9 mg/dL (ref 8.9–10.3)
Chloride: 100 mmol/L (ref 98–111)
Creatinine: 1.26 mg/dL — ABNORMAL HIGH (ref 0.61–1.24)
GFR, Estimated: 58 mL/min — ABNORMAL LOW (ref >60)
Glucose, Bld: 180 mg/dL — ABNORMAL HIGH (ref 70–99)
Potassium: 4.1 mmol/L (ref 3.5–5.1)
Sodium: 132 mmol/L — ABNORMAL LOW (ref 135–145)
Total Bilirubin: 0.6 mg/dL (ref 0.3–1.2)
Total Protein: 8 g/dL (ref 6.5–8.1)

## 2021-05-12 MED ORDER — DARATUMUMAB-HYALURONIDASE-FIHJ 1800-30000 MG-UT/15ML ~~LOC~~ SOLN
1800.0000 mg | Freq: Once | SUBCUTANEOUS | Status: AC
  Administered 2021-05-12: 1800 mg via SUBCUTANEOUS
  Filled 2021-05-12: qty 15

## 2021-05-12 MED ORDER — ACETAMINOPHEN 325 MG PO TABS
650.0000 mg | ORAL_TABLET | Freq: Once | ORAL | Status: AC
  Administered 2021-05-12: 650 mg via ORAL
  Filled 2021-05-12: qty 2

## 2021-05-12 MED ORDER — DIPHENHYDRAMINE HCL 25 MG PO CAPS
50.0000 mg | ORAL_CAPSULE | Freq: Once | ORAL | Status: AC
  Administered 2021-05-12: 50 mg via ORAL
  Filled 2021-05-12: qty 2

## 2021-05-12 MED ORDER — DEXAMETHASONE 4 MG PO TABS: 20.0000 mg | ORAL_TABLET | Freq: Once | ORAL | Status: DC

## 2021-05-12 NOTE — Patient Instructions (Signed)
[facility]  Discharge Instructions: Thank you for choosing [facility] to provide your oncology and hematology care.   If you have a lab appointment with the Cancer Center, please go directly to the Cancer Center and check in at the registration area.   Wear comfortable clothing and clothing appropriate for easy access to any Portacath or PICC line.   We strive to give you quality time with your provider. You may need to reschedule your appointment if you arrive late (15 or more minutes).  Arriving late affects you and other patients whose appointments are after yours.  Also, if you miss three or more appointments without notifying the office, you may be dismissed from the clinic at the provider's discretion.      For prescription refill requests, have your pharmacy contact our office and allow 72 hours for refills to be completed.    Today you received the following chemotherapy and/or immunotherapy agents: Darzalex Faspro      To help prevent nausea and vomiting after your treatment, we encourage you to take your nausea medication as directed.  BELOW ARE SYMPTOMS THAT SHOULD BE REPORTED IMMEDIATELY: *FEVER GREATER THAN 100.4 F (38 C) OR HIGHER *CHILLS OR SWEATING *NAUSEA AND VOMITING THAT IS NOT CONTROLLED WITH YOUR NAUSEA MEDICATION *UNUSUAL SHORTNESS OF BREATH *UNUSUAL BRUISING OR BLEEDING *URINARY PROBLEMS (pain or burning when urinating, or frequent urination) *BOWEL PROBLEMS (unusual diarrhea, constipation, pain near the anus) TENDERNESS IN MOUTH AND THROAT WITH OR WITHOUT PRESENCE OF ULCERS (sore throat, sores in mouth, or a toothache) UNUSUAL RASH, SWELLING OR PAIN  UNUSUAL VAGINAL DISCHARGE OR ITCHING   Items with * indicate a potential emergency and should be followed up as soon as possible or go to the Emergency Department if any problems should occur.  Please show the CHEMOTHERAPY ALERT CARD or IMMUNOTHERAPY ALERT CARD at  check-in to the Emergency Department and triage nurse.  Should you have questions after your visit or need to cancel or reschedule your appointment, please contact [facility]  Dept: [phone]  and follow the prompts.  Office hours are 8:00 a.m. to 4:30 p.m. Monday - Friday. Please note that voicemails left after 4:00 p.m. may not be returned until the following business day.  We are closed weekends and major holidays. You have access to a nurse at all times for urgent questions. Please call the main number to the clinic Dept: [phone] and follow the prompts.   For any non-urgent questions, you may also contact your provider using MyChart. We now offer e-Visits for anyone 18 and older to request care online for non-urgent symptoms. For details visit [email or web address].   Also download the MyChart app! Go to the app store, search "MyChart", open the app, select [place], and log in with your MyChart username and password.  Due to Covid, a mask is required upon entering the hospital/clinic. If you do not have a mask, one will be given to you upon arrival. For doctor visits, patients may have 1 support person aged 18 or older with them. For treatment visits, patients cannot have anyone with them due to current Covid guidelines and our immunocompromised population.  

## 2021-05-12 NOTE — Progress Notes (Signed)
Patient took prescribed dose of decadron prior to arrival at the infusion center- about 1pm per report. Removed from medication administration.

## 2021-05-24 ENCOUNTER — Ambulatory Visit: Payer: Medicare HMO | Admitting: Internal Medicine

## 2021-05-26 ENCOUNTER — Other Ambulatory Visit: Payer: Self-pay

## 2021-05-26 ENCOUNTER — Inpatient Hospital Stay: Payer: Medicare HMO

## 2021-05-26 ENCOUNTER — Inpatient Hospital Stay: Payer: Medicare HMO | Attending: Oncology

## 2021-05-26 VITALS — BP 137/79 | HR 65 | Temp 98.4°F | Resp 18 | Wt 172.2 lb

## 2021-05-26 DIAGNOSIS — Z5112 Encounter for antineoplastic immunotherapy: Secondary | ICD-10-CM | POA: Insufficient documentation

## 2021-05-26 DIAGNOSIS — C9000 Multiple myeloma not having achieved remission: Secondary | ICD-10-CM | POA: Insufficient documentation

## 2021-05-26 LAB — CMP (CANCER CENTER ONLY)
ALT: 9 U/L (ref 0–44)
AST: 10 U/L — ABNORMAL LOW (ref 15–41)
Albumin: 3.5 g/dL (ref 3.5–5.0)
Alkaline Phosphatase: 33 U/L — ABNORMAL LOW (ref 38–126)
Anion gap: 11 (ref 5–15)
BUN: 15 mg/dL (ref 8–23)
CO2: 23 mmol/L (ref 22–32)
Calcium: 9.1 mg/dL (ref 8.9–10.3)
Chloride: 99 mmol/L (ref 98–111)
Creatinine: 1.21 mg/dL (ref 0.61–1.24)
GFR, Estimated: >60 mL/min (ref >60)
Glucose, Bld: 133 mg/dL — ABNORMAL HIGH (ref 70–99)
Potassium: 4 mmol/L (ref 3.5–5.1)
Sodium: 133 mmol/L — ABNORMAL LOW (ref 135–145)
Total Bilirubin: 0.4 mg/dL (ref 0.3–1.2)
Total Protein: 8.3 g/dL — ABNORMAL HIGH (ref 6.5–8.1)

## 2021-05-26 LAB — CBC WITH DIFFERENTIAL (CANCER CENTER ONLY)
Abs Immature Granulocytes: 0.02 10*3/uL (ref 0.00–0.07)
Basophils Absolute: 0 10*3/uL (ref 0.0–0.1)
Basophils Relative: 0 %
Eosinophils Absolute: 0.1 10*3/uL (ref 0.0–0.5)
Eosinophils Relative: 2 %
HCT: 27.5 % — ABNORMAL LOW (ref 39.0–52.0)
Hemoglobin: 9.6 g/dL — ABNORMAL LOW (ref 13.0–17.0)
Immature Granulocytes: 0 %
Lymphocytes Relative: 24 %
Lymphs Abs: 1.6 10*3/uL (ref 0.7–4.0)
MCH: 34.7 pg — ABNORMAL HIGH (ref 26.0–34.0)
MCHC: 34.9 g/dL (ref 30.0–36.0)
MCV: 99.3 fL (ref 80.0–100.0)
Monocytes Absolute: 0.5 10*3/uL (ref 0.1–1.0)
Monocytes Relative: 7 %
Neutro Abs: 4.6 10*3/uL (ref 1.7–7.7)
Neutrophils Relative %: 67 %
Platelet Count: 117 10*3/uL — ABNORMAL LOW (ref 150–400)
RBC: 2.77 MIL/uL — ABNORMAL LOW (ref 4.22–5.81)
RDW: 14.9 % (ref 11.5–15.5)
WBC Count: 6.8 10*3/uL (ref 4.0–10.5)
nRBC: 0 % (ref 0.0–0.2)

## 2021-05-26 MED ORDER — DEXAMETHASONE 4 MG PO TABS: 20.0000 mg | ORAL_TABLET | Freq: Once | ORAL | Status: DC

## 2021-05-26 MED ORDER — ZOLEDRONIC ACID 4 MG/100ML IV SOLN
4.0000 mg | Freq: Once | INTRAVENOUS | Status: AC
  Administered 2021-05-26: 4 mg via INTRAVENOUS
  Filled 2021-05-26: qty 100

## 2021-05-26 MED ORDER — SODIUM CHLORIDE 0.9 % IV SOLN: Freq: Once | INTRAVENOUS | Status: AC

## 2021-05-26 MED ORDER — DIPHENHYDRAMINE HCL 25 MG PO CAPS
50.0000 mg | ORAL_CAPSULE | Freq: Once | ORAL | Status: AC
  Administered 2021-05-26: 50 mg via ORAL
  Filled 2021-05-26: qty 2

## 2021-05-26 MED ORDER — ACETAMINOPHEN 325 MG PO TABS
650.0000 mg | ORAL_TABLET | Freq: Once | ORAL | Status: AC
  Administered 2021-05-26: 650 mg via ORAL
  Filled 2021-05-26: qty 2

## 2021-05-26 MED ORDER — DARATUMUMAB-HYALURONIDASE-FIHJ 1800-30000 MG-UT/15ML ~~LOC~~ SOLN
1800.0000 mg | Freq: Once | SUBCUTANEOUS | Status: AC
  Administered 2021-05-26: 1800 mg via SUBCUTANEOUS
  Filled 2021-05-26: qty 15

## 2021-05-26 NOTE — Progress Notes (Signed)
Pt informed RN that he took his PO dexamethasone at home prior to his inf appt today. ?

## 2021-05-26 NOTE — Patient Instructions (Signed)
[facility]  Discharge Instructions: Thank you for choosing [facility] to provide your oncology and hematology care.   If you have a lab appointment with the [facility], please go directly to the [facility] and check in at the registration area.   Wear comfortable clothing and clothing appropriate for easy access to any Portacath or PICC line.   We strive to give you quality time with your provider. You may need to reschedule your appointment if you arrive late (15 or more minutes).  Arriving late affects you and other patients whose appointments are after yours.  Also, if you miss three or more appointments without notifying the office, you may be dismissed from the clinic at the providers discretion.      For prescription refill requests, have your pharmacy contact our office and allow 72 hours for refills to be completed.    Today you received the following chemotherapy and/or immunotherapy agents: Darzalex Faspro     To help prevent nausea and vomiting after your treatment, we encourage you to take your nausea medication as directed.  BELOW ARE SYMPTOMS THAT SHOULD BE REPORTED IMMEDIATELY: *FEVER GREATER THAN 100.4 F (38 C) OR HIGHER *CHILLS OR SWEATING *NAUSEA AND VOMITING THAT IS NOT CONTROLLED WITH YOUR NAUSEA MEDICATION *UNUSUAL SHORTNESS OF BREATH *UNUSUAL BRUISING OR BLEEDING *URINARY PROBLEMS (pain or burning when urinating, or frequent urination) *BOWEL PROBLEMS (unusual diarrhea, constipation, pain near the anus) TENDERNESS IN MOUTH AND THROAT WITH OR WITHOUT PRESENCE OF ULCERS (sore throat, sores in mouth, or a toothache) UNUSUAL RASH, SWELLING OR PAIN  UNUSUAL VAGINAL DISCHARGE OR ITCHING   Items with * indicate a potential emergency and should be followed up as soon as possible or go to the Emergency Department if any problems should occur.  Please show the CHEMOTHERAPY ALERT CARD or IMMUNOTHERAPY ALERT CARD at  check-in to the Emergency Department and triage nurse.  Should you have questions after your visit or need to cancel or reschedule your appointment, please contact [facility]  Dept: [phone]  and follow the prompts.  Office hours are 8:00 a.m. to 4:30 p.m. Monday - Friday. Please note that voicemails left after 4:00 p.m. may not be returned until the following business day.  We are closed weekends and major holidays. You have access to a nurse at all times for urgent questions. Please call the main number to the clinic Dept: [phone] and follow the prompts.   For any non-urgent questions, you may also contact your provider using MyChart. We now offer e-Visits for anyone 80 and older to request care online for non-urgent symptoms. For details visit [email or web address].   Also download the MyChart app! Go to the app store, search "MyChart", open the app, select [facility], and log in with your MyChart username and password.  Due to Covid, a mask is required upon entering the hospital/clinic. If you do not have a mask, one will be given to you upon arrival. For doctor visits, patients may have 1 support person aged 8 or older with them. For treatment visits, patients cannot have anyone with them due to current Covid guidelines and our immunocompromised population.   Zoledronic Acid Injection (Hypercalcemia, Oncology) What is this medication? ZOLEDRONIC ACID (ZOE le dron ik AS id) slows calcium loss from bones. It high calcium levels in the blood from some kinds of cancer. It may be used in other people at risk for bone loss. This medicine may  be used for other purposes; ask your health care provider or pharmacist if you have questions. COMMON BRAND NAME(S): Zometa What should I tell my care team before I take this medication? They need to know if you have any of these conditions: cancer dehydration dental disease kidney disease liver disease low levels  of calcium in the blood lung or breathing disease (asthma) receiving steroids like dexamethasone or prednisone an unusual or allergic reaction to zoledronic acid, other medicines, foods, dyes, or preservatives pregnant or trying to get pregnant breast-feeding How should I use this medication? This drug is injected into a vein. It is given by a health care provider in a hospital or clinic setting. Talk to your health care provider about the use of this drug in children. Special care may be needed. Overdosage: If you think you have taken too much of this medicine contact a poison control center or emergency room at once. NOTE: This medicine is only for you. Do not share this medicine with others. What if I miss a dose? Keep appointments for follow-up doses. It is important not to miss your dose. Call your health care provider if you are unable to keep an appointment. What may interact with this medication? certain antibiotics given by injection NSAIDs, medicines for pain and inflammation, like ibuprofen or naproxen some diuretics like bumetanide, furosemide teriparatide thalidomide This list may not describe all possible interactions. Give your health care provider a list of all the medicines, herbs, non-prescription drugs, or dietary supplements you use. Also tell them if you smoke, drink alcohol, or use illegal drugs. Some items may interact with your medicine. What should I watch for while using this medication? Visit your health care provider for regular checks on your progress. It may be some time before you see the benefit from this drug. Some people who take this drug have severe bone, joint, or muscle pain. This drug may also increase your risk for jaw problems or a broken thigh bone. Tell your health care provider right away if you have severe pain in your jaw, bones, joints, or muscles. Tell you health care provider if you have any pain that does not go away or that gets worse. Tell  your dentist and dental surgeon that you are taking this drug. You should not have major dental surgery while on this drug. See your dentist to have a dental exam and fix any dental problems before starting this drug. Take good care of your teeth while on this drug. Make sure you see your dentist for regular follow-up appointments. You should make sure you get enough calcium and vitamin D while you are taking this drug. Discuss the foods you eat and the vitamins you take with your health care provider. Check with your health care provider if you have severe diarrhea, nausea, and vomiting, or if you sweat a lot. The loss of too much body fluid may make it dangerous for you to take this drug. You may need blood work done while you are taking this drug. Do not become pregnant while taking this drug. Women should inform their health care provider if they wish to become pregnant or think they might be pregnant. There is potential for serious harm to an unborn child. Talk to your health care provider for more information. What side effects may I notice from receiving this medication? Side effects that you should report to your doctor or health care provider as soon as possible: allergic reactions (skin rash, itching or hives;  swelling of the face, lips, or tongue) bone pain infection (fever, chills, cough, sore throat, pain or trouble passing urine) jaw pain, especially after dental work joint pain kidney injury (trouble passing urine or change in the amount of urine) low blood pressure (dizziness; feeling faint or lightheaded, falls; unusually weak or tired) low calcium levels (fast heartbeat; muscle cramps or pain; pain, tingling, or numbness in the hands or feet; seizures) low magnesium levels (fast, irregular heartbeat; muscle cramp or pain; muscle weakness; tremors; seizures) low red blood cell counts (trouble breathing; feeling faint; lightheaded, falls; unusually weak or tired) muscle  pain redness, blistering, peeling, or loosening of the skin, including inside the mouth severe diarrhea swelling of the ankles, feet, hands trouble breathing Side effects that usually do not require medical attention (report to your doctor or health care provider if they continue or are bothersome): anxious constipation coughing depressed mood eye irritation, itching, or pain fever general ill feeling or flu-like symptoms nausea pain, redness, or irritation at site where injected trouble sleeping This list may not describe all possible side effects. Call your doctor for medical advice about side effects. You may report side effects to FDA at 1-800-FDA-1088. Where should I keep my medication? This drug is given in a hospital or clinic. It will not be stored at home. NOTE: This sheet is a summary. It may not cover all possible information. If you have questions about this medicine, talk to your doctor, pharmacist, or health care provider.  2022 Elsevier/Gold Standard (2020-11-24 00:00:00)

## 2021-05-27 ENCOUNTER — Ambulatory Visit: Payer: Medicare HMO | Admitting: Internal Medicine

## 2021-05-27 ENCOUNTER — Encounter: Payer: Self-pay | Admitting: Internal Medicine

## 2021-05-27 ENCOUNTER — Ambulatory Visit (INDEPENDENT_AMBULATORY_CARE_PROVIDER_SITE_OTHER): Payer: Medicare HMO

## 2021-05-27 VITALS — BP 144/79 | HR 79 | Ht 70.0 in | Wt 172.0 lb

## 2021-05-27 DIAGNOSIS — I4891 Unspecified atrial fibrillation: Secondary | ICD-10-CM

## 2021-05-27 LAB — KAPPA/LAMBDA LIGHT CHAINS
Kappa free light chain: 3.3 mg/L (ref 3.3–19.4)
Kappa, lambda light chain ratio: 0.01 — ABNORMAL LOW (ref 0.26–1.65)
Lambda free light chains: 586.6 mg/L — ABNORMAL HIGH (ref 5.7–26.3)

## 2021-05-27 NOTE — Progress Notes (Signed)
°Cardiology Office Note:   ° °Date:  05/27/2021  ° °ID:  Devin [name], DOB 10/09/1940, MRN [id number] ° °PCP:  [name], Kathryn S, MD °  °[facility] Providers °Cardiologist:  Branch, Mary E, MD    ° °Referring MD: [name], Kathryn S, *  ° °No chief complaint on file. °Hx of atrial fibrillation ° °History of Present Illness:   ° °Devin [name] is a 81 y.o. 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 In late January he was hospitalized for 2 days. Per documentation, notes he was in afib with RVR. Heart rates noted 150s. He was started on dilt and became mildly hypotensive and was switched to labetalol. Echo report stated mildly reduced EF. Normal left atrium. He was started on labetalol and eliquis. ° °He has no known prior hx of atrial fibrillation. One year ago he fell and broke his hip Feb 2022.  No frequent falls.  He denies palpitaitons. No stroke, no DM2, no CHF. No hx of PCI. Chronic LE edema, pitting. He does not like compression stockings ° °His oncology hx is below. He is continued on darzalex. ° ° °Onc °RCC- robotic laparoscopic partial nephrectomy on October 03, 2016 ° °MM-Velcade and dexamethasone started on January 7 to be given weekly.  Last cycle given on 09/11/2020.Revlimid 10 mg po daily for 21 days with dexamethasone 20 mg weekly started on 09/22/2020.  ° °Darzalex faspro 1800 mg weekly started on February 17, 2021.   He will receive treatment every other week per protocol starting on April 28, 2021. °  °Zometa 4 mg every 3 months.  Last treatment given on December 14 and will be repeated in March 2023. ° ° °Labs: °05/26/2021 °Platelet - 117 °Hgb- 9.6 ° ° ° °Cardiology Studies: ° °[facility] °TTE 04/17/21- Left Ventricle: Left ventricle size is normal. Systolic function is   °mildly abnormal. EF: 45-50%.  There is mild  hypokinesis of the left  °ventricle.  °  Left Atrium: Left atrium size is normal.  °  Right Ventricle: Right ventricle size is normal. Systolic function is  °normal.  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  °  Compard to study from 2021, EF is now mildly decreased. ° °Nuclear Stress/Cardolite- normal stress ° ° °Past Medical History:  °Diagnosis Date  ° Arthritis   ° History of pulmonary embolism 04/2016  ° Hypertension   ° Renal cell carcinoma (HCC)   ° ° °Past Surgical History:  °Procedure Laterality Date  ° APPENDECTOMY    ° DENTAL SURGERY  03/2016  ° all upper teeth pulled and dentures.   ° HERNIA REPAIR  1985&2001  ° IR FLUORO GUIDED NEEDLE PLC ASPIRATION/INJECTION LOC  02/05/2020  ° right knee replacement   04/2016  ° right wrist plate due to fracture     ° ROBOT ASSISTED LAPAROSCOPIC NEPHRECTOMY Right 10/03/2016  ° Procedure: XI ROBOTIC ASSISTED LAPAROSCOPIC  PARTIAL NEPHRECTOMY;  Surgeon: Borden, Lester, MD;  Location: [facility];  Service: Urology;  Laterality: Right;  ° ° °Current Medications: °Current Meds  °Medication Sig  ° acetaminophen (TYLENOL) 500 MG tablet Take 1,000 mg by mouth daily as needed for mild pain or moderate pain.  ° acyclovir (ZOVIRAX) 400   MG tablet Take 1 tablet (400 mg total) by mouth daily.  ° allopurinol (ZYLOPRIM) 300 MG tablet Take 300 mg by mouth daily.   ° amLODipine (NORVASC) 10 MG tablet Take 10 mg by mouth daily.  ° apixaban (ELIQUIS) 5 MG TABS tablet 1 tablet  ° Calcium Carb-Cholecalciferol (OYSTER SHELL CALCIUM W/D) 500-5 MG-MCG TABS TAKE 1 TABLET BY MOUTH TWICE A DAY  ° calcium-vitamin D (OSCAL WITH D) 500-200 MG-UNIT tablet Take 1 tablet by mouth 2 (two) times daily.  ° dexamethasone (DECADRON) 4 MG tablet Take 5 tablets weekly on the day of cancer treatment.  ° labetalol (NORMODYNE) 300 MG tablet Take 300 mg by mouth 2 (two) times daily.  ° morphine (MS CONTIN) 30 MG 12 hr  tablet Take 1 tablet (30 mg total) by mouth every 12 (twelve) hours.  ° morphine (MSIR) 15 MG tablet Take 1 tablet (15 mg total) by mouth every 4 (four) hours as needed for severe pain.  ° NARCAN 4 MG/0.1ML LIQD nasal spray kit Place 1 spray into the nose once.  ° polyethylene glycol (MIRALAX / GLYCOLAX) 17 g packet Take 17 g by mouth daily as needed for moderate constipation.  ° rosuvastatin (CRESTOR) 5 MG tablet Take 5 mg by mouth daily.  ° senna-docusate (SENOKOT-S) 8.6-50 MG tablet Take 1 tablet by mouth at bedtime.  ° Vitamin D, Ergocalciferol, (DRISDOL) 1.25 MG (50000 UNIT) CAPS capsule Take 50,000 Units by mouth every 14 (fourteen) days.  °  ° °Allergies:   Amlodipine, Pravastatin, Zestril [lisinopril], Other, and Oxycodone  ° °Social History  ° °Socioeconomic History  ° Marital status: Married  °  Spouse name: Not on file  ° Number of children: Not on file  ° Years of education: Not on file  ° Highest education level: Not on file  °Occupational History  °  Comment: retired  °Tobacco Use  ° Smoking status: Former  ° Smokeless tobacco: Former  °  Quit date: 08/09/1985  °Vaping Use  ° Vaping Use: Never used  °Substance and Sexual Activity  ° Alcohol use: Yes  °  Comment: occ beer   ° Drug use: No  ° Sexual activity: Not Currently  °Other Topics Concern  ° Not on file  °Social History Narrative  ° Not on file  ° °Social Determinants of Health  ° °Financial Resource Strain: Not on file  °Food Insecurity: Not on file  °Transportation Needs: Not on file  °Physical Activity: Not on file  °Stress: Not on file  °Social Connections: Not on file  °  ° °Family History: °The patient's family history includes Cancer in his mother. There is no history of Breast cancer, Colon cancer, Pancreatic cancer, or Prostate cancer. ° °ROS:   °Please see the history of present illness.    ° All other systems reviewed and are negative. ° °EKGs/Labs/Other Studies Reviewed:   ° °The following studies were reviewed today: ° ° °EKG:  EKG is   ordered today.  The ekg ordered today demonstrates  ° °NSR, 1st degree AV block, PACs. Qtc 442 ms ° ° ° °Recent Labs: °05/26/2021: ALT 9; BUN 15; Creatinine 1.21; Hemoglobin 9.6; Platelet Count 117; Potassium 4.0; Sodium 133  °Recent Lipid Panel °No results found for: CHOL, TRIG, HDL, CHOLHDL, VLDL, LDLCALC, LDLDIRECT ° ° °Risk Assessment/Calculations:   ° °CHA2DS2-VASc Score = 3  ° This indicates a 3.2% annual risk of stroke. °The patient's score is based upon: °CHF History: 0 °HTN History: 1 °Diabetes History: 0 °Stroke History:   0 °Vascular Disease History: 0 °Age Score: 2 °Gender Score: 0 °  ° ° °    ° °Physical Exam:   ° °VS:   °Vitals:  ° 05/27/21 0951  °BP: (!) 144/79  °Pulse: 79  °SpO2: 97%  ° ° ° °BP (!) 144/79    Pulse 79    Ht 5' 10" (1.778 m)    Wt 172 lb (78 kg)    SpO2 97%    BMI 24.68 kg/m²    ° °Wt Readings from Last 3 Encounters:  °05/27/21 172 lb (78 kg)  °05/26/21 172 lb 4 oz (78.1 kg)  °05/12/21 169 lb 12 oz (77 kg)  °  ° °GEN:  Well nourished, well developed in no acute distress °HEENT: Normal °NECK: No JVD;  °LYMPHATICS: No lymphadenopathy °CARDIAC: RRR, 3/6 RUSB SEM, rubs, gallops °RESPIRATORY:  Clear to auscultation without rales, wheezing or rhonchi  °ABDOMEN: Soft, non-tender, non-distended °MUSCULOSKELETAL:  No edema; No deformity  °SKIN: Warm and dry °NEUROLOGIC:  Alert and oriented x 3 °PSYCHIATRIC:  Normal affect  ° °ASSESSMENT:   ° °Paroxysmal Afib: per notes at [facility]. No ECG here. Will plan for event monitor to confirm. He is in sinus rhythm today. EF mildly reduced. He was noted to have normal LA size. I will continue him on anticoagulation. He was noted to have atrial fibrillation and has CHADS2VASC of 3. Will plan for lifelong. However, if his platelets were to drop, can hold his eliquis until he has plt recovery > 50 K. °- will change labetalol to metop when prescription runs out (per wife's preference) °-continue eliquis 5 mg BID ° °Mild AS: stable. No chest XRT. Will continue to  monitor ° °HTN: blood pressures in a good range. Can continue current regimen for now. ° °PLAN:   ° °In order of problems listed above: ° °Ziopatch 14 days °Follow up 3 months ° °   ° °   ° ° °Medication Adjustments/Labs and Tests Ordered: °Current medicines are reviewed at length with the patient today.  Concerns regarding medicines are outlined above.  °Orders Placed This Encounter  °Procedures  ° LONG TERM MONITOR (3-14 DAYS)  ° EKG 12-Lead  ° °No orders of the defined types were placed in this encounter. ° ° °Patient Instructions  °Medication Instructions:  °No Changes In Medications at this time.  °*If you need a refill on your cardiac medications before your next appointment, please call your pharmacy* ° °Testing/Procedures: ° °ZIO XT- Long Term Monitor Instructions  ° °Your physician has requested you wear your ZIO patch monitor___14____days.  ° °This is a single patch monitor.  [employer] supplies one patch monitor per enrollment.  Additional stickers are not available. °  °Please do not apply patch if you will be having a Nuclear Stress Test, Echocardiogram, Cardiac CT, MRI, or Chest Xray during the time frame you would be wearing the monitor. The patch cannot be worn during these tests.  You cannot remove and re-apply the ZIO XT patch monitor. °  °Your ZIO patch monitor will be sent USPS Priority mail from [employer] directly to your home address. The monitor may also be mailed to a PO BOX if home delivery is not available.   It may take 3-5 days to receive your monitor after you have been enrolled. °  °Once you have received you monitor, please review enclosed instructions.  Your monitor has already been registered assigning a specific monitor serial # to you. °  °Applying the monitor  ° °  Shave hair from upper left chest.   Hold abrader disc by orange tab.  Rub abrader in 40 strokes over left upper chest as indicated in your monitor instructions.   Clean area with 4 enclosed alcohol pads .  Use  all pads to assure are is cleaned thoroughly.  Let dry.   Apply patch as indicated in monitor instructions.  Patch will be place under collarbone on left side of chest with arrow pointing upward.   Rub patch adhesive wings for 2 minutes.Remove white label marked "1".  Remove white label marked "2".  Rub patch adhesive wings for 2 additional minutes.   While looking in a mirror, press and release button in center of patch.  A small green light will flash 3-4 times .  This will be your only indicator the monitor has been turned on.     Do not shower for the first 24 hours.  You may shower after the first 24 hours.   Press button if you feel a symptom. You will hear a small click.  Record Date, Time and Symptom in the Patient Log Book.   When you are ready to remove patch, follow instructions on last 2 pages of Patient Log Book.  Stick patch monitor onto last page of Patient Log Book.   Place Patient Log Book in [place] box.  Use locking tab on box and tape box closed securely.  The [employer] has [employer] on it.  Please place in mailbox as soon as possible.  Your physician should have your test results approximately 7 days after the monitor has been mailed back to [facility].   Call [facility] at [phone] if you have questions regarding your ZIO XT patch monitor.  Call them immediately if you see an orange light blinking on your monitor.   If your monitor falls off in less than 4 days contact our Monitor department at [phone].  If your monitor becomes loose or falls off after 4 days call [employer] at [phone] for suggestions on securing your monitor.   Follow-Up: At [facility], you and your health needs are our priority.  As part of our continuing mission to provide you with exceptional heart care, we have created designated Provider Care Teams.  These Care Teams include your primary Cardiologist (physician) and Advanced Practice Providers  (APPs -  Physician Assistants and Nurse Practitioners) who all work together to provide you with the care you need, when you need it.  Your next appointment:   3 month(s)  The format for your next appointment:   In Person  Provider:   Janina Mayo, MD     Signed, Janina Mayo, MD  05/27/2021 12:00 PM    [place]

## 2021-05-27 NOTE — Patient Instructions (Signed)
Medication Instructions:  ?No Changes In Medications at this time.  ?*If you need a refill on your cardiac medications before your next appointment, please call your pharmacy* ? ?Testing/Procedures: ? ?ZIO XT- Long Term Monitor Instructions  ? ?Your physician has requested you wear your ZIO patch monitor___14____days.  ? ?This is a single patch monitor.  [employer] supplies one patch monitor per enrollment.  Additional stickers are not available. ?  ?Please do not apply patch if you will be having a Nuclear Stress Test, Echocardiogram, Cardiac CT, MRI, or Chest Xray during the time frame you would be wearing the monitor. The patch cannot be worn during these tests.  You cannot remove and re-apply the ZIO XT patch monitor. ?  ?Your ZIO patch monitor will be sent USPS Priority mail from [facility] directly to your home address. The monitor may also be mailed to a PO BOX if home delivery is not available.   It may take 3-5 days to receive your monitor after you have been enrolled. ?  ?Once you have received you monitor, please review enclosed instructions.  Your monitor has already been registered assigning a specific monitor serial # to you. ?  ?Applying the monitor  ? ?Shave hair from upper left chest. ?  ?Hold abrader disc by orange tab.  Rub abrader in 40 strokes over left upper chest as indicated in your monitor instructions. ?  ?Clean area with 4 enclosed alcohol pads .  Use all pads to assure are is cleaned thoroughly.  Let dry.  ? ?Apply patch as indicated in monitor instructions.  Patch will be place under collarbone on left side of chest with arrow pointing upward. ?  ?Rub patch adhesive wings for 2 minutes.Remove white label marked "1".  Remove white label marked "2".  Rub patch adhesive wings for 2 additional minutes. ?  ?While looking in a mirror, press and release button in center of patch.  A small green light will flash 3-4 times .  This will be your only indicator the monitor has been turned  on. ?    ?Do not shower for the first 24 hours.  You may shower after the first 24 hours. ?  ?Press button if you feel a symptom. You will hear a small click.  Record Date, Time and Symptom in the Patient Log Book. ?  ?When you are ready to remove patch, follow instructions on last 2 pages of Patient Log Book.  Stick patch monitor onto last page of Patient Log Book. ?  ?Place Patient Log Book in [facility] box.  Use locking tab on box and tape box closed securely.  The [employer] has [employer] on it.  Please place in mailbox as soon as possible.  Your physician should have your test results approximately 7 days after the monitor has been mailed back to [facility]. ?  ?Call [facility] at [phone] if you have questions regarding your ZIO XT patch monitor.  Call them immediately if you see an orange light blinking on your monitor. ?  ?If your monitor falls off in less than 4 days contact our Monitor department at [phone].  If your monitor becomes loose or falls off after 4 days call [employer] at [phone] for suggestions on securing your monitor.  ? ?Follow-Up: ?At [facility], you and your health needs are our priority.  As part of our continuing mission to provide you with exceptional heart care, we have created designated Provider Care Teams.  These Care Teams include your primary Cardiologist (physician) and Advanced Practice Providers (APPs -  Physician Assistants and Nurse Practitioners) who all work together to provide you with the care you need, when you need it. ? ?Your next appointment:   ?3 month(s) ? ?The format for your next appointment:   ?In Person ? ?Provider:   ?Janina Mayo, MD   ?

## 2021-05-27 NOTE — Progress Notes (Unsigned)
Enrolled for [employer] to mail a ZIO XT long term holter monitor to the patients address on file.  

## 2021-05-31 DIAGNOSIS — I4891 Unspecified atrial fibrillation: Secondary | ICD-10-CM

## 2021-05-31 LAB — MULTIPLE MYELOMA PANEL, SERUM
Albumin SerPl Elph-Mcnc: 3.4 g/dL (ref 2.9–4.4)
Albumin/Glob SerPl: 0.8 (ref 0.7–1.7)
Alpha 1: 0.3 g/dL (ref 0.0–0.4)
Alpha2 Glob SerPl Elph-Mcnc: 0.7 g/dL (ref 0.4–1.0)
B-Globulin SerPl Elph-Mcnc: 1.1 g/dL (ref 0.7–1.3)
Gamma Glob SerPl Elph-Mcnc: 2.5 g/dL — ABNORMAL HIGH (ref 0.4–1.8)
Globulin, Total: 4.6 g/dL — ABNORMAL HIGH (ref 2.2–3.9)
IgA: 2628 mg/dL — ABNORMAL HIGH (ref 61–437)
IgG (Immunoglobin G), Serum: 295 mg/dL — ABNORMAL LOW (ref 603–1613)
IgM (Immunoglobulin M), Srm: <5 mg/dL — ABNORMAL LOW (ref 15–143)
M Protein SerPl Elph-Mcnc: 2.1 g/dL — ABNORMAL HIGH
Total Protein ELP: 8 g/dL (ref 6.0–8.5)

## 2021-06-09 ENCOUNTER — Other Ambulatory Visit: Payer: Self-pay

## 2021-06-09 ENCOUNTER — Inpatient Hospital Stay: Payer: Medicare HMO

## 2021-06-09 ENCOUNTER — Inpatient Hospital Stay: Payer: Medicare HMO | Admitting: Oncology

## 2021-06-09 VITALS — BP 129/78 | HR 65 | Temp 97.8°F | Resp 17 | Ht 70.0 in | Wt 172.2 lb

## 2021-06-09 DIAGNOSIS — C649 Malignant neoplasm of unspecified kidney, except renal pelvis: Secondary | ICD-10-CM

## 2021-06-09 DIAGNOSIS — C9000 Multiple myeloma not having achieved remission: Secondary | ICD-10-CM | POA: Diagnosis not present

## 2021-06-09 DIAGNOSIS — Z5112 Encounter for antineoplastic immunotherapy: Secondary | ICD-10-CM | POA: Diagnosis not present

## 2021-06-09 LAB — CBC WITH DIFFERENTIAL (CANCER CENTER ONLY)
Abs Immature Granulocytes: 0.01 10*3/uL (ref 0.00–0.07)
Basophils Absolute: 0 10*3/uL (ref 0.0–0.1)
Basophils Relative: 0 %
Eosinophils Absolute: 0.1 10*3/uL (ref 0.0–0.5)
Eosinophils Relative: 2 %
HCT: 27.5 % — ABNORMAL LOW (ref 39.0–52.0)
Hemoglobin: 9.6 g/dL — ABNORMAL LOW (ref 13.0–17.0)
Immature Granulocytes: 0 %
Lymphocytes Relative: 22 %
Lymphs Abs: 1.4 10*3/uL (ref 0.7–4.0)
MCH: 34.8 pg — ABNORMAL HIGH (ref 26.0–34.0)
MCHC: 34.9 g/dL (ref 30.0–36.0)
MCV: 99.6 fL (ref 80.0–100.0)
Monocytes Absolute: 0.3 10*3/uL (ref 0.1–1.0)
Monocytes Relative: 4 %
Neutro Abs: 4.5 10*3/uL (ref 1.7–7.7)
Neutrophils Relative %: 72 %
Platelet Count: 129 10*3/uL — ABNORMAL LOW (ref 150–400)
RBC: 2.76 MIL/uL — ABNORMAL LOW (ref 4.22–5.81)
RDW: 14.8 % (ref 11.5–15.5)
WBC Count: 6.2 10*3/uL (ref 4.0–10.5)
nRBC: 0 % (ref 0.0–0.2)

## 2021-06-09 LAB — CMP (CANCER CENTER ONLY)
ALT: 10 U/L (ref 0–44)
AST: 11 U/L — ABNORMAL LOW (ref 15–41)
Albumin: 3.5 g/dL (ref 3.5–5.0)
Alkaline Phosphatase: 31 U/L — ABNORMAL LOW (ref 38–126)
Anion gap: 10 (ref 5–15)
BUN: 13 mg/dL (ref 8–23)
CO2: 22 mmol/L (ref 22–32)
Calcium: 9 mg/dL (ref 8.9–10.3)
Chloride: 99 mmol/L (ref 98–111)
Creatinine: 1.43 mg/dL — ABNORMAL HIGH (ref 0.61–1.24)
GFR, Estimated: 50 mL/min — ABNORMAL LOW (ref >60)
Glucose, Bld: 117 mg/dL — ABNORMAL HIGH (ref 70–99)
Potassium: 4.3 mmol/L (ref 3.5–5.1)
Sodium: 131 mmol/L — ABNORMAL LOW (ref 135–145)
Total Bilirubin: 0.4 mg/dL (ref 0.3–1.2)
Total Protein: 9 g/dL — ABNORMAL HIGH (ref 6.5–8.1)

## 2021-06-09 MED ORDER — DIPHENHYDRAMINE HCL 25 MG PO CAPS
50.0000 mg | ORAL_CAPSULE | Freq: Once | ORAL | Status: AC
  Administered 2021-06-09: 50 mg via ORAL
  Filled 2021-06-09: qty 2

## 2021-06-09 MED ORDER — ACETAMINOPHEN 325 MG PO TABS
650.0000 mg | ORAL_TABLET | Freq: Once | ORAL | Status: AC
  Administered 2021-06-09: 650 mg via ORAL
  Filled 2021-06-09: qty 2

## 2021-06-09 MED ORDER — MORPHINE SULFATE ER 30 MG PO TBCR: 30.0000 mg | EXTENDED_RELEASE_TABLET | Freq: Two times a day (BID) | ORAL | 0 refills | Status: AC

## 2021-06-09 MED ORDER — DEXAMETHASONE 4 MG PO TABS: 20.0000 mg | ORAL_TABLET | Freq: Once | ORAL | Status: DC

## 2021-06-09 MED ORDER — MORPHINE SULFATE 15 MG PO TABS: 15.0000 mg | ORAL_TABLET | ORAL | 0 refills | Status: AC | PRN

## 2021-06-09 MED ORDER — DARATUMUMAB-HYALURONIDASE-FIHJ 1800-30000 MG-UT/15ML ~~LOC~~ SOLN
1800.0000 mg | Freq: Once | SUBCUTANEOUS | Status: AC
  Administered 2021-06-09: 1800 mg via SUBCUTANEOUS
  Filled 2021-06-09: qty 15

## 2021-06-09 NOTE — Progress Notes (Signed)
Pt informed RN that he took PO dexamethasone at home prior to his appt today ?

## 2021-06-09 NOTE — Patient Instructions (Signed)
[facility]COL[facility]  Discharge Instructions: Thank you for choosing [facility] to provide your oncology and hematology care.   If you have a lab appointment with the Cancer Center, please go directly to the Cancer Center and check in at the registration area.   Wear comfortable clothing and clothing appropriate for easy access to any Portacath or PICC line.   We strive to give you quality time with your provider. You may need to reschedule your appointment if you arrive late (15 or more minutes).  Arriving late affects you and other patients whose appointments are after yours.  Also, if you miss three or more appointments without notifying the office, you may be dismissed from the clinic at the provider's discretion.      For prescription refill requests, have your pharmacy contact our office and allow 72 hours for refills to be completed.    Today you received the following chemotherapy and/or immunotherapy agents: Darzalex Faspro      To help prevent nausea and vomiting after your treatment, we encourage you to take your nausea medication as directed.  BELOW ARE SYMPTOMS THAT SHOULD BE REPORTED IMMEDIATELY: *FEVER GREATER THAN 100.4 F (38 C) OR HIGHER *CHILLS OR SWEATING *NAUSEA AND VOMITING THAT IS NOT CONTROLLED WITH YOUR NAUSEA MEDICATION *UNUSUAL SHORTNESS OF BREATH *UNUSUAL BRUISING OR BLEEDING *URINARY PROBLEMS (pain or burning when urinating, or frequent urination) *BOWEL PROBLEMS (unusual diarrhea, constipation, pain near the anus) TENDERNESS IN MOUTH AND THROAT WITH OR WITHOUT PRESENCE OF ULCERS (sore throat, sores in mouth, or a toothache) UNUSUAL RASH, SWELLING OR PAIN  UNUSUAL VAGINAL DISCHARGE OR ITCHING   Items with * indicate a potential emergency and should be followed up as soon as possible or go to the Emergency Department if any problems should occur.  Please show the CHEMOTHERAPY ALERT CARD or IMMUNOTHERAPY ALERT CARD at  check-in to the Emergency Department and triage nurse.  Should you have questions after your visit or need to cancel or reschedule your appointment, please contact [facility]  Dept: [phone]  and follow the prompts.  Office hours are 8:00 a.m. to 4:30 p.m. Monday - Friday. Please note that voicemails left after 4:00 p.m. may not be returned until the following business day.  We are closed weekends and major holidays. You have access to a nurse at all times for urgent questions. Please call the main number to the clinic Dept: [phone] and follow the prompts.   For any non-urgent questions, you may also contact your provider using MyChart. We now offer e-Visits for anyone 18 and older to request care online for non-urgent symptoms. For details visit [email or web address].   Also download the MyChart app! Go to the app store, search "MyChart", open the app, select [facility], and log in with your MyChart username and password.  Due to Covid, a mask is required upon entering the hospital/clinic. If you do not have a mask, one will be given to you upon arrival. For doctor visits, patients may have 1 support person aged 18 or older with them. For treatment visits, patients cannot have anyone with them due to current Covid guidelines and our immunocompromised population.  

## 2021-06-09 NOTE — Progress Notes (Signed)
Hematology and Oncology Follow Up Visit ? ?Devin [name] ?[id number] ?21-Jun-1940 81 y.o. ?06/09/2021 12:44 PM ?[name], MDTimberlake, [name], *  ? ?Principle Diagnosis: 81 year old man with multiple myeloma diagnosed in 2021.  He presented with pathological bone fracture, IgA subtype with 50% involvement of the bone marrow, 3 q. 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 ? 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 ? ? ?Prior Therapy:  ? ?He status post right robotic laparoscopic partial nephrectomy on October 03, 2016. ? ?Palliative radiation therapy to the spine.  He completed 20 Gy in 10 fractions on March 24, 2020. ? ?Velcade and dexamethasone started on January 7 to be given weekly.  Last cycle given on 09/11/2020 ? ?Revlimid 10 mg po daily for 21 days with dexamethasone 20 mg weekly started on 09/22/2020.  ? ? ? ?Current therapy:  ? ?Darzalex faspro 1800 mg weekly started on February 17, 2021.   He will receive treatment every other week per protocol starting on April 28, 2021. ? ?Zometa 4 mg every 3 months.  He received data on May 26, 2021 and will be repeated in 3 months. ? ? ? ?Interim History: [name] returns today for repeat evaluation.  Since last visit, he reports no major changes in his health.  He continues to tolerate current therapy without any complaints.  He denies any nausea, vomiting or abdominal pain.  He denies any hospitalizations or illnesses.  He denies any worsening bone pain or pathological fractures.  He continues to receive physical therapy with improvement in his mobility.  He denies any falls or syncope. ? ? ? ?Medications: Updated on review. ?Current Outpatient Medications  ?Medication Sig Dispense Refill  ? acetaminophen (TYLENOL) 500 MG tablet Take 1,000 mg by mouth daily as needed for mild pain or moderate pain.    ? acyclovir (ZOVIRAX) 400 MG tablet Take 1 tablet (400 mg total) by mouth daily.  90 tablet 3  ? allopurinol (ZYLOPRIM) 300 MG tablet Take 300 mg by mouth daily.     ? amLODipine (NORVASC) 10 MG tablet Take 10 mg by mouth daily.    ? apixaban (ELIQUIS) 5 MG TABS tablet 1 tablet    ? Calcium Carb-Cholecalciferol (OYSTER SHELL CALCIUM W/D) 500-5 MG-MCG TABS TAKE 1 TABLET BY MOUTH TWICE A DAY 180 tablet 1  ? calcium-vitamin D (OSCAL WITH D) 500-200 MG-UNIT tablet Take 1 tablet by mouth 2 (two) times daily. 60 tablet 3  ? dexamethasone (DECADRON) 4 MG tablet Take 5 tablets weekly on the day of cancer treatment. 60 tablet 3  ? labetalol (NORMODYNE) 300 MG tablet Take 300 mg by mouth 2 (two) times daily.    ? morphine (MS CONTIN) 30 MG 12 hr tablet Take 1 tablet (30 mg total) by mouth every 12 (twelve) hours. 60 tablet 0  ? morphine (MSIR) 15 MG tablet Take 1 tablet (15 mg total) by mouth every 4 (four) hours as needed for severe pain. 60 tablet 0  ? NARCAN 4 MG/0.1ML LIQD nasal spray kit Place 1 spray into the nose once.    ? polyethylene glycol (MIRALAX / GLYCOLAX) 17 g packet Take 17 g by mouth daily as needed for moderate constipation. 14 each 0  ? rosuvastatin (CRESTOR) 5 MG tablet Take 5 mg by mouth daily.    ? senna-docusate (SENOKOT-S) 8.6-50 MG tablet Take 1 tablet by mouth at bedtime. 100 tablet 0  ? Vitamin D, Ergocalciferol, (DRISDOL) 1.25 MG (  50000 UNIT) CAPS capsule Take 50,000 Units by mouth every 14 (fourteen) days.    ? ?No current facility-administered medications for this visit.  ? ? ? ?Allergies:  ?Allergies  ?Allergen Reactions  ? Amlodipine Swelling  ? Pravastatin Other (See Comments)  ?  Joint pain  ? Zestril [Lisinopril] Other (See Comments)  ?  Gout   ( Zestril) ?gout  ? Other Other (See Comments)  ? Oxycodone Other (See Comments)  ?  Causes agitation   ? ? ? ? ?Physical Exam: ? ? ? ? ? ?Blood pressure 129/78, pulse 65, temperature 97.8 ?F (36.6 ?C), temperature source Temporal, resp. rate 17, height 5' 10"  (1.778 m), weight 172 lb 3.2 oz (78.1 kg), SpO2 99  %. ? ? ? ? ? ? ? ? ?ECOG:  1 ? ? ? ?General appearance: Comfortable appearing without any discomfort ?Head: Normocephalic without any trauma ?Oropharynx: Mucous membranes are moist and pink without any thrush or ulcers. ?Eyes: Pupils are equal and round reactive to light. ?Lymph nodes: No cervical, supraclavicular, inguinal or axillary lymphadenopathy.   ?Heart:regular rate and rhythm.  S1 and S2 without leg edema. ?Lung: Clear without any rhonchi or wheezes.  No dullness to percussion. ?Abdomin: Soft, nontender, nondistended with good bowel sounds.  No hepatosplenomegaly. ?Musculoskeletal: No joint deformity or effusion.  Full range of motion noted. ?Neurological: No deficits noted on motor, sensory and deep tendon reflex exam. ?Skin: No petechial rash or dryness.  Appeared moist.  ? ? ? ? ? ? ? ? ? ? ? ? ? ?Lab Results: ?Lab Results  ?Component Value Date  ? WBC 6.8 05/26/2021  ? HGB 9.6 (L) 05/26/2021  ? HCT 27.5 (L) 05/26/2021  ? MCV 99.3 05/26/2021  ? PLT 117 (L) 05/26/2021  ? ?  Chemistry   ?   ?Component Value Date/Time  ? NA 133 (L) 05/26/2021 1309  ? K 4.0 05/26/2021 1309  ? CL 99 05/26/2021 1309  ? CO2 23 05/26/2021 1309  ? BUN 15 05/26/2021 1309  ? CREATININE 1.21 05/26/2021 1309  ?    ?Component Value Date/Time  ? CALCIUM 9.1 05/26/2021 1309  ? ALKPHOS 33 (L) 05/26/2021 1309  ? AST 10 (L) 05/26/2021 1309  ? ALT 9 05/26/2021 1309  ? BILITOT 0.4 05/26/2021 1309  ?  ? ? ? ? Latest Reference Range & Units 03/24/21 07:42 03/31/21 13:52 04/14/21 14:37 05/26/21 13:09  ?IgA 61 - 437 mg/dL 2,683 (H) 2,821 (H) 2,604 (H) 2,628 (H)  ?(H): Data is abnormally high ? ? 04/14/21 14:37  ?IFE 1 Comment ! (C)  ?!: Data is abnormal ?(C): Corrected ? ?Impression and Plan: ? ?81 year old with: ? ?1.  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   ? ?The natural course of his disease was reviewed at this time and treatment options were discussed.  He is currently on daratumumab single agent salvage therapy with  reasonable tolerance and modest response.  His M spike is down close to 30% which is a reasonable response at this time.  Risks and benefits of continuing this treatment versus additional therapy utilizing Kyprolis infusion were reiterated. ? ?After discussion today, we opted to continue with daratumumab single agents and he will be receiving it every other week for the time being. ? ? ?2.  Chronic pain: His pain is overall manageable currently on morphine.  He is using morphine long-acting medication with short acting breakthrough infrequently. ? ? ? ?3.  Antiemetics: No nausea or vomiting reported at this  time.  Compazine is available to him. ? ? ?4.  Hypocalcemia: Improved at this time and will continue to monitor moving forward. ? ?5.  VZV prophylaxis: No evidence of reactivation at this time.  I recommended continuing acyclovir. ? ? ?6.  Bone directed therapy: He is currently on Zometa and received it on May 26, 2021.  This will be repeated in 3 months. ? ?7.  Follow-up: He will continue on daratumumab per protocol and MD follow-up in the next 6 weeks. ? ? ?30  minutes were spent on this visit.  The time was dedicated to reviewing laboratory data, disease status update and outlining future plan of care review. ? ? ?Zola Button, MD ?3/22/202312:44 PM ? ?

## 2021-06-12 ENCOUNTER — Encounter: Payer: Self-pay | Admitting: Oncology

## 2021-06-16 DIAGNOSIS — D6869 Other thrombophilia: Secondary | ICD-10-CM | POA: Diagnosis not present

## 2021-06-16 DIAGNOSIS — N1831 Chronic kidney disease, stage 3a: Secondary | ICD-10-CM | POA: Diagnosis not present

## 2021-06-16 DIAGNOSIS — E7800 Pure hypercholesterolemia, unspecified: Secondary | ICD-10-CM | POA: Diagnosis not present

## 2021-06-16 DIAGNOSIS — E119 Type 2 diabetes mellitus without complications: Secondary | ICD-10-CM | POA: Diagnosis not present

## 2021-06-16 DIAGNOSIS — I10 Essential (primary) hypertension: Secondary | ICD-10-CM | POA: Diagnosis not present

## 2021-06-16 DIAGNOSIS — I4891 Unspecified atrial fibrillation: Secondary | ICD-10-CM | POA: Diagnosis not present

## 2021-06-16 DIAGNOSIS — E43 Unspecified severe protein-calorie malnutrition: Secondary | ICD-10-CM | POA: Diagnosis not present

## 2021-06-21 DIAGNOSIS — I4891 Unspecified atrial fibrillation: Secondary | ICD-10-CM | POA: Diagnosis not present

## 2021-06-23 ENCOUNTER — Inpatient Hospital Stay: Payer: Medicare HMO | Attending: Oncology

## 2021-06-23 ENCOUNTER — Inpatient Hospital Stay: Payer: Medicare HMO

## 2021-06-23 ENCOUNTER — Other Ambulatory Visit: Payer: Self-pay

## 2021-06-23 VITALS — BP 151/90 | HR 67 | Temp 98.8°F | Resp 16 | Wt 171.0 lb

## 2021-06-23 DIAGNOSIS — C9000 Multiple myeloma not having achieved remission: Secondary | ICD-10-CM | POA: Insufficient documentation

## 2021-06-23 DIAGNOSIS — Z5112 Encounter for antineoplastic immunotherapy: Secondary | ICD-10-CM | POA: Insufficient documentation

## 2021-06-23 LAB — CBC WITH DIFFERENTIAL (CANCER CENTER ONLY)
Abs Immature Granulocytes: 0.02 10*3/uL (ref 0.00–0.07)
Basophils Absolute: 0 10*3/uL (ref 0.0–0.1)
Basophils Relative: 0 %
Eosinophils Absolute: 0.1 10*3/uL (ref 0.0–0.5)
Eosinophils Relative: 2 %
HCT: 28.3 % — ABNORMAL LOW (ref 39.0–52.0)
Hemoglobin: 9.7 g/dL — ABNORMAL LOW (ref 13.0–17.0)
Immature Granulocytes: 0 %
Lymphocytes Relative: 27 %
Lymphs Abs: 1.4 10*3/uL (ref 0.7–4.0)
MCH: 34.2 pg — ABNORMAL HIGH (ref 26.0–34.0)
MCHC: 34.3 g/dL (ref 30.0–36.0)
MCV: 99.6 fL (ref 80.0–100.0)
Monocytes Absolute: 0.3 10*3/uL (ref 0.1–1.0)
Monocytes Relative: 6 %
Neutro Abs: 3.3 10*3/uL (ref 1.7–7.7)
Neutrophils Relative %: 65 %
Platelet Count: 93 10*3/uL — ABNORMAL LOW (ref 150–400)
RBC: 2.84 MIL/uL — ABNORMAL LOW (ref 4.22–5.81)
RDW: 14.8 % (ref 11.5–15.5)
WBC Count: 5.1 10*3/uL (ref 4.0–10.5)
nRBC: 0 % (ref 0.0–0.2)

## 2021-06-23 LAB — CMP (CANCER CENTER ONLY)
ALT: 9 U/L (ref 0–44)
AST: 13 U/L — ABNORMAL LOW (ref 15–41)
Albumin: 3.3 g/dL — ABNORMAL LOW (ref 3.5–5.0)
Alkaline Phosphatase: 29 U/L — ABNORMAL LOW (ref 38–126)
Anion gap: 10 (ref 5–15)
BUN: 18 mg/dL (ref 8–23)
CO2: 23 mmol/L (ref 22–32)
Calcium: 9.2 mg/dL (ref 8.9–10.3)
Chloride: 100 mmol/L (ref 98–111)
Creatinine: 1.59 mg/dL — ABNORMAL HIGH (ref 0.61–1.24)
GFR, Estimated: 44 mL/min — ABNORMAL LOW (ref >60)
Glucose, Bld: 132 mg/dL — ABNORMAL HIGH (ref 70–99)
Potassium: 4.6 mmol/L (ref 3.5–5.1)
Sodium: 133 mmol/L — ABNORMAL LOW (ref 135–145)
Total Bilirubin: 0.4 mg/dL (ref 0.3–1.2)
Total Protein: 9.6 g/dL — ABNORMAL HIGH (ref 6.5–8.1)

## 2021-06-23 MED ORDER — ACETAMINOPHEN 325 MG PO TABS
650.0000 mg | ORAL_TABLET | Freq: Once | ORAL | Status: AC
  Administered 2021-06-23: 650 mg via ORAL
  Filled 2021-06-23: qty 2

## 2021-06-23 MED ORDER — DIPHENHYDRAMINE HCL 25 MG PO CAPS
50.0000 mg | ORAL_CAPSULE | Freq: Once | ORAL | Status: AC
  Administered 2021-06-23: 50 mg via ORAL
  Filled 2021-06-23: qty 2

## 2021-06-23 MED ORDER — DARATUMUMAB-HYALURONIDASE-FIHJ 1800-30000 MG-UT/15ML ~~LOC~~ SOLN
1800.0000 mg | Freq: Once | SUBCUTANEOUS | Status: AC
  Administered 2021-06-23: 1800 mg via SUBCUTANEOUS
  Filled 2021-06-23: qty 15

## 2021-06-23 MED ORDER — DEXAMETHASONE 4 MG PO TABS: 20.0000 mg | ORAL_TABLET | Freq: Once | ORAL | Status: DC

## 2021-06-23 NOTE — Progress Notes (Signed)
Patient is here today for his darzalex faspro and his platelets are 48- sent a message to Dr. Alen Blew and he gave the ok to treat today. ? ?Patient states that he took '20mg'$  of PO dexamethasone at home around 11:30-noon. ?

## 2021-06-23 NOTE — Patient Instructions (Signed)
[facility]  Discharge Instructions: Thank you for choosing [facility] to provide your oncology and hematology care.   If you have a lab appointment with the Cancer Center, please go directly to the Cancer Center and check in at the registration area.   Wear comfortable clothing and clothing appropriate for easy access to any Portacath or PICC line.   We strive to give you quality time with your provider. You may need to reschedule your appointment if you arrive late (15 or more minutes).  Arriving late affects you and other patients whose appointments are after yours.  Also, if you miss three or more appointments without notifying the office, you may be dismissed from the clinic at the provider's discretion.      For prescription refill requests, have your pharmacy contact our office and allow 72 hours for refills to be completed.    Today you received the following chemotherapy and/or immunotherapy agents: Darzalex Faspro      To help prevent nausea and vomiting after your treatment, we encourage you to take your nausea medication as directed.  BELOW ARE SYMPTOMS THAT SHOULD BE REPORTED IMMEDIATELY: *FEVER GREATER THAN 100.4 F (38 C) OR HIGHER *CHILLS OR SWEATING *NAUSEA AND VOMITING THAT IS NOT CONTROLLED WITH YOUR NAUSEA MEDICATION *UNUSUAL SHORTNESS OF BREATH *UNUSUAL BRUISING OR BLEEDING *URINARY PROBLEMS (pain or burning when urinating, or frequent urination) *BOWEL PROBLEMS (unusual diarrhea, constipation, pain near the anus) TENDERNESS IN MOUTH AND THROAT WITH OR WITHOUT PRESENCE OF ULCERS (sore throat, sores in mouth, or a toothache) UNUSUAL RASH, SWELLING OR PAIN  UNUSUAL VAGINAL DISCHARGE OR ITCHING   Items with * indicate a potential emergency and should be followed up as soon as possible or go to the Emergency Department if any problems should occur.  Please show the CHEMOTHERAPY ALERT CARD or IMMUNOTHERAPY ALERT CARD at  check-in to the Emergency Department and triage nurse.  Should you have questions after your visit or need to cancel or reschedule your appointment, please contact [facility]  Dept: [phone]  and follow the prompts.  Office hours are 8:00 a.m. to 4:30 p.m. Monday - Friday. Please note that voicemails left after 4:00 p.m. may not be returned until the following business day.  We are closed weekends and major holidays. You have access to a nurse at all times for urgent questions. Please call the main number to the clinic Dept: [phone] and follow the prompts.   For any non-urgent questions, you may also contact your provider using MyChart. We now offer e-Visits for anyone 18 and older to request care online for non-urgent symptoms. For details visit [email or web address].   Also download the MyChart app! Go to the app store, search "MyChart", open the app, select [facility], and log in with your MyChart username and password.  Due to Covid, a mask is required upon entering the hospital/clinic. If you do not have a mask, one will be given to you upon arrival. For doctor visits, patients may have 1 support person aged 18 or older with them. For treatment visits, patients cannot have anyone with them due to current Covid guidelines and our immunocompromised population.  

## 2021-06-28 ENCOUNTER — Telehealth: Payer: Self-pay | Admitting: Internal Medicine

## 2021-06-28 ENCOUNTER — Telehealth: Payer: Self-pay | Admitting: Oncology

## 2021-06-28 NOTE — Telephone Encounter (Signed)
Caprice Beaver, LPN  ?04/23/3433 68:61 AM EDT   ?  ?Mychart 04/05  ? Janina Mayo, MD  ?06/23/2021  9:06 AM EDT   ?  ?Hi Devin [name], please let Devin [name] know that his ziopatch did not show atrial fibrillation. With reported history he can continue his blood thinner for now. We can continue to monitor in clinc  ? ?

## 2021-06-28 NOTE — Telephone Encounter (Signed)
Called patient regarding upcoming appointments, left a voicemail. 

## 2021-06-28 NOTE — Telephone Encounter (Signed)
Spoke with patient's wife and relayed results. Advised that patient should continue medications. She is asking if he should continue labetalol. Notified her that MD did not change meds based on monitor results  ? ?Wife states med change was discussed with MD. Per last office note: ? ?Paroxysmal Afib: per [facility] at [facility]. No ECG here. Will plan for event monitor to confirm. He is in sinus rhythm today. EF mildly reduced. He was noted to have normal LA size. I will continue him on anticoagulation. He was noted to have atrial fibrillation and has CHADS2VASC of 3. Will plan for lifelong. However, if his platelets were to drop, can hold his eliquis until he has plt recovery > 50 K. ?- will change labetalol to metop when prescription runs out (per wife's preference) ?- continue eliquis 5 mg BID ? ?She said patient has enough labetalol right now.  ? ?Advised will send to [facility] MD to review ?

## 2021-06-28 NOTE — Telephone Encounter (Signed)
Patient's wife calling for monitor results and to see if any medications need to be adjusted.  ?

## 2021-06-29 NOTE — Telephone Encounter (Signed)
Please advise on dose of metoprolol  ?

## 2021-07-01 NOTE — Telephone Encounter (Addendum)
Returned call to patients wife (okay per patient)- advised her that per Dr. Harl Bowie patient can stop labetalol and start Metoprolol succinate '50mg'$  once daily.  ? ?Patients wife has several questions for Dr. Harl Bowie and would like to schedule an appointment to discuss further before starting or changing any medications ? ?Scheduled patient an appointment next Thursday 4/20 with Dr. Harl Bowie  ? ?Advised patients wife to call back with any issues, questions, or concerns.  ?

## 2021-07-05 DIAGNOSIS — E119 Type 2 diabetes mellitus without complications: Secondary | ICD-10-CM | POA: Diagnosis not present

## 2021-07-05 DIAGNOSIS — E871 Hypo-osmolality and hyponatremia: Secondary | ICD-10-CM | POA: Diagnosis not present

## 2021-07-05 DIAGNOSIS — N189 Chronic kidney disease, unspecified: Secondary | ICD-10-CM | POA: Diagnosis not present

## 2021-07-05 DIAGNOSIS — C9000 Multiple myeloma not having achieved remission: Secondary | ICD-10-CM | POA: Diagnosis not present

## 2021-07-05 DIAGNOSIS — G8929 Other chronic pain: Secondary | ICD-10-CM | POA: Diagnosis not present

## 2021-07-05 DIAGNOSIS — R531 Weakness: Secondary | ICD-10-CM | POA: Diagnosis not present

## 2021-07-05 DIAGNOSIS — I517 Cardiomegaly: Secondary | ICD-10-CM | POA: Diagnosis not present

## 2021-07-05 DIAGNOSIS — I4891 Unspecified atrial fibrillation: Secondary | ICD-10-CM | POA: Diagnosis not present

## 2021-07-05 DIAGNOSIS — N179 Acute kidney failure, unspecified: Secondary | ICD-10-CM | POA: Diagnosis not present

## 2021-07-05 DIAGNOSIS — E860 Dehydration: Secondary | ICD-10-CM | POA: Diagnosis not present

## 2021-07-05 DIAGNOSIS — M1990 Unspecified osteoarthritis, unspecified site: Secondary | ICD-10-CM | POA: Diagnosis not present

## 2021-07-06 ENCOUNTER — Telehealth: Payer: Self-pay | Admitting: *Deleted

## 2021-07-06 DIAGNOSIS — C9000 Multiple myeloma not having achieved remission: Secondary | ICD-10-CM | POA: Diagnosis not present

## 2021-07-06 DIAGNOSIS — R531 Weakness: Secondary | ICD-10-CM | POA: Diagnosis not present

## 2021-07-06 DIAGNOSIS — N1831 Chronic kidney disease, stage 3a: Secondary | ICD-10-CM | POA: Diagnosis not present

## 2021-07-06 DIAGNOSIS — I10 Essential (primary) hypertension: Secondary | ICD-10-CM | POA: Diagnosis not present

## 2021-07-06 DIAGNOSIS — I4891 Unspecified atrial fibrillation: Secondary | ICD-10-CM | POA: Diagnosis not present

## 2021-07-06 DIAGNOSIS — E119 Type 2 diabetes mellitus without complications: Secondary | ICD-10-CM | POA: Diagnosis not present

## 2021-07-06 DIAGNOSIS — E860 Dehydration: Secondary | ICD-10-CM | POA: Diagnosis not present

## 2021-07-06 NOTE — Telephone Encounter (Signed)
-----   Message from Wyatt Portela, MD sent at 07/06/2021  9:48 AM EDT ----- ?Regarding: RE: Dehydration ?We can hold treatment 4/19. I will assess the need for IVF next visit. Thanks ?----- Message ----- ?From: Rolene Course, RN ?Sent: 07/06/2021   9:35 AM EDT ?To: Wyatt Portela, MD ?Subject: Dehydration                                   ? ?His wife called this morning, this patient is currently admitted at [facility] in [place] for dehydration, she thinks he will be DC'd today.  He is scheduled for his darzalex faspro tomorrow, she is asking if this will be ok.  She said he was also dehydrated & hospitalized in January so she is asking if he possibly needs some IV fluids on a regular basis.  Please advise. ? ?Thanks, ?[name] ? ? ?

## 2021-07-06 NOTE — Telephone Encounter (Signed)
PC to patient's wife, [name] - informed her tomorrow's tx to be canceled per Dr. Alen Blew.  He will further discuss the need for possible IV fluids at his next visit on 07/21/21.  She verbalizes understanding. ?

## 2021-07-07 ENCOUNTER — Inpatient Hospital Stay: Payer: Medicare HMO

## 2021-07-08 ENCOUNTER — Encounter: Payer: Self-pay | Admitting: Internal Medicine

## 2021-07-08 ENCOUNTER — Ambulatory Visit: Payer: Medicare HMO | Admitting: Internal Medicine

## 2021-07-08 VITALS — BP 148/80 | HR 74 | Ht 70.0 in | Wt 169.8 lb

## 2021-07-08 DIAGNOSIS — I4891 Unspecified atrial fibrillation: Secondary | ICD-10-CM | POA: Diagnosis not present

## 2021-07-08 MED ORDER — METOPROLOL SUCCINATE ER 25 MG PO TB24: 25.0000 mg | ORAL_TABLET | Freq: Every day | ORAL | 3 refills | Status: AC

## 2021-07-08 NOTE — Progress Notes (Signed)
?Cardiology Office Note:   ? ?Date:  07/08/2021  ? ?ID:  Devin [name], DOB Mar 25, 1940, MRN [id number] ? ?PCP:  [name], MD ?  ?[facility] Providers ?Cardiologist:  Janina Mayo, MD    ? ?Referring MD: [name], *  ? ?No chief complaint on file. ?Hx of atrial fibrillation ? ?History of Present Illness:   ? ?Devin [name] is a 81 y.o. 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 ?He was admitted to [facility] for PNA. In late January he was hospitalized for 2 days. Per documentation, notes he was in afib with RVR. Heart rates noted 150s. He was started on dilt and became mildly hypotensive and was switched to labetalol. Echo report stated mildly reduced EF. Normal left atrium. He was started on labetalol and eliquis. ? ?He has no known prior hx of atrial fibrillation. One year ago he fell and broke his hip Feb 2022.  No frequent falls.  He denies palpitaitons. No stroke, no DM2, no CHF. No hx of PCI. Chronic LE edema, pitting. He does not like compression stockings ? ?His oncology hx is below. He is continued on darzalex. ? ?Interim Hx 07/08/2021 ?Patient wished to discussed zio results and requested appointment. He had a zio taht showed no afib/flutter. He had minimal SVT.  ? ?Onc ?RCC- robotic laparoscopic partial nephrectomy on October 03, 2016 ? ?MM-Velcade and dexamethasone started on January 7 to be given weekly.  Last cycle given on 09/11/2020.Revlimid 10 mg po daily for 21 days with dexamethasone 20 mg weekly started on 09/22/2020.  ? ?Darzalex faspro 1800 mg weekly started on February 17, 2021.   He will receive treatment every other week per protocol starting on April 28, 2021. ?  ?Zometa 4 mg every 3 months.  Last treatment given on December 14 and will be repeated in March  2023. ? ? ?Labs: ?05/26/2021 ?Platelet - 117 ?Hgb- 9.6 ? ? ? ?Cardiology Studies: ? ?[facility] ?TTE 04/17/21- Left Ventricle: Left ventricle size is normal. Systolic function is  ?mildly abnormal. EF: 45-50%.  There is mild  hypokinesis of the left  ?ventricle.  ?  Left Atrium: Left atrium size is normal.  ?  Right Ventricle: Right ventricle size is normal. Systolic function is  ?normal.  ?  Aortic Valve: There is mild stenosis, with peak and mean gradients of  ?15.000 and  mmHg.  ?  Compard to study from 2021, EF is now mildly decreased. ? ?Nuclear Stress/Cardolite- normal stress ? ? ?Past Medical History:  ?Diagnosis Date  ? Arthritis   ? History of pulmonary embolism 04/2016  ? Hypertension   ? Renal cell carcinoma ([place])   ? ? ?Past Surgical History:  ?Procedure Laterality Date  ? APPENDECTOMY    ? DENTAL SURGERY  03/2016  ? all upper teeth pulled and dentures.   ? HERNIA REPAIR  1985&2001  ? IR FLUORO GUIDED NEEDLE PLC ASPIRATION/INJECTION LOC  02/05/2020  ? right knee replacement   04/2016  ? right wrist plate due to fracture     ? ROBOT ASSISTED LAPAROSCOPIC NEPHRECTOMY Right 10/03/2016  ? Procedure: XI ROBOTIC ASSISTED LAPAROSCOPIC  PARTIAL NEPHRECTOMY;  Surgeon: Raynelle Bring, MD;  Location: [facility];  Service: Urology;  Laterality: Right;  ? ? ?Current Medications: ?Current Meds  ?Medication Sig  ?  acetaminophen (TYLENOL) 500 MG tablet Take 1,000 mg by mouth daily as needed for mild pain or moderate pain.  ? acyclovir (ZOVIRAX) 400 MG tablet Take 1 tablet (400 mg total) by mouth daily.  ? allopurinol (ZYLOPRIM) 300 MG tablet Take 300 mg by mouth daily.   ? amLODipine (NORVASC) 10 MG tablet Take 10 mg by mouth daily.  ? apixaban (ELIQUIS) 5 MG TABS tablet 1 tablet  ? Calcium Carb-Cholecalciferol (OYSTER SHELL CALCIUM W/D) 500-5 MG-MCG TABS TAKE 1 TABLET BY MOUTH TWICE A DAY  ? calcium-vitamin D (OSCAL WITH D) 500-200 MG-UNIT tablet Take 1 tablet by mouth 2 (two) times daily.  ? dexamethasone (DECADRON) 4 MG tablet  Take 5 tablets weekly on the day of cancer treatment.  ? metoprolol succinate (TOPROL-XL) 25 MG 24 hr tablet Take 1 tablet (25 mg total) by mouth daily. Take with or immediately following a meal.  ? morphine (MS CONTIN) 30 MG 12 hr tablet Take 1 tablet (30 mg total) by mouth every 12 (twelve) hours.  ? morphine (MSIR) 15 MG tablet Take 1 tablet (15 mg total) by mouth every 4 (four) hours as needed for severe pain.  ? polyethylene glycol (MIRALAX / GLYCOLAX) 17 g packet Take 17 g by mouth daily as needed for moderate constipation.  ? rosuvastatin (CRESTOR) 5 MG tablet Take 5 mg by mouth daily.  ? senna-docusate (SENOKOT-S) 8.6-50 MG tablet Take 1 tablet by mouth at bedtime.  ? Vitamin D, Ergocalciferol, (DRISDOL) 1.25 MG (50000 UNIT) CAPS capsule Take 50,000 Units by mouth every 14 (fourteen) days.  ? [DISCONTINUED] labetalol (NORMODYNE) 300 MG tablet Take 300 mg by mouth 2 (two) times daily.  ?  ? ?Allergies:   Amlodipine, Pravastatin, Zestril [lisinopril], Other, and Oxycodone  ? ?Social History  ? ?Socioeconomic History  ? Marital status: Married  ?  Spouse name: Not on file  ? Number of children: Not on file  ? Years of education: Not on file  ? Highest education level: Not on file  ?Occupational History  ?  Comment: retired  ?Tobacco Use  ? Smoking status: Former  ? Smokeless tobacco: Former  ?  Quit date: 08/09/1985  ?Vaping Use  ? Vaping Use: Never used  ?Substance and Sexual Activity  ? Alcohol use: Yes  ?  Comment: occ beer   ? Drug use: No  ? Sexual activity: Not Currently  ?Other Topics Concern  ? Not on file  ?Social History Narrative  ? Not on file  ? ?Social Determinants of Health  ? ?Financial Resource Strain: Not on file  ?Food Insecurity: Not on file  ?Transportation Needs: Not on file  ?Physical Activity: Not on file  ?Stress: Not on file  ?Social Connections: Not on file  ?  ? ?Family History: ?The patient's family history includes Cancer in his mother. There is no history of Breast cancer, Colon  cancer, Pancreatic cancer, or Prostate cancer. ? ?ROS:   ?Please see the history of present illness.    ? All other systems reviewed and are negative. ? ?EKGs/Labs/Other Studies Reviewed:   ? ?The following studies were reviewed today: ? ? ?EKG:  EKG is  ordered today.  The ekg ordered today demonstrates  ? ?NSR, 1st degree AV block, PACs. Qtc 442 ms ? ? ? ?Recent Labs: ?06/23/2021: ALT 9; BUN 18; Creatinine 1.59; Hemoglobin 9.7; Platelet Count 93; Potassium 4.6; Sodium 133  ?Recent Lipid Panel ?No results found for: CHOL, TRIG, HDL, CHOLHDL, VLDL, LDLCALC, LDLDIRECT ? ? ?Risk  Assessment/Calculations:   ? ?CHA2DS2-VASc Score = 3  ? This indicates a 3.2% annual risk of stroke. ?The patient's score is based upon: ?CHF History: 0 ?HTN History: 1 ?Diabetes History: 0 ?Stroke History: 0 ?Vascular Disease History: 0 ?Age Score: 2 ?Gender Score: 0 ?  ? ? ?    ? ?Physical Exam:   ? ?VS:   ?Vitals:  ? 07/08/21 1400  ?BP: (!) 148/80  ?Pulse: 74  ?SpO2: 98%  ? ? ? ? ?BP (!) 148/80   Pulse 74   Ht $R'5\' 10"'ms$  (1.778 m)   Wt 169 lb 12.8 oz (77 kg)   SpO2 98%   BMI 24.36 kg/m?    ? ?Wt Readings from Last 3 Encounters:  ?07/08/21 169 lb 12.8 oz (77 kg)  ?06/23/21 171 lb (77.6 kg)  ?06/09/21 172 lb 3.2 oz (78.1 kg)  ?  ? ?GEN:  Well nourished, well developed in no acute distress ?HEENT: Normal ?NECK: No JVD;  ?LYMPHATICS: No lymphadenopathy ?CARDIAC: RRR, 3/6 RUSB SEM, rubs, gallops ?RESPIRATORY:  Clear to auscultation without rales, wheezing or rhonchi  ?ABDOMEN: Soft, non-tender, non-distended ?MUSCULOSKELETAL:  No edema; No deformity  ?SKIN: Warm and dry ?NEUROLOGIC:  Alert and oriented x 3 ?PSYCHIATRIC:  Normal affect  ? ?ASSESSMENT:   ? ?Paroxysmal Afib: per notes at [facility]. No ECG here. Will plan for event monitor to confirm. He is in sinus rhythm today. EF mildly reduced. He was noted to have normal LA size. I will continue him on anticoagulation. He was noted to have atrial fibrillation and has CHADS2VASC of 3. Will plan for  lifelong. However, if his platelets were to drop, can hold his eliquis until he has plt recovery > 50 K.Devin [name] did not show afib but did have an occurrence in the past. He is still at risk. ?- plt 93 ok to continue anticoagulation ?-

## 2021-07-08 NOTE — Patient Instructions (Signed)
Medication Instructions:  ?STOP LABETALOL  ?START: METOPROLOL SUCCINATE '25mg'$  ONCE DAILY  ?*If you need a refill on your cardiac medications before your next appointment, please call your pharmacy* ? ?Follow-Up: ?At [facility], you and your health needs are our priority.  As part of our continuing mission to provide you with exceptional heart care, we have created designated Provider Care Teams.  These Care Teams include your primary Cardiologist (physician) and Advanced Practice Providers (APPs -  Physician Assistants and Nurse Practitioners) who all work together to provide you with the care you need, when you need it. ? ?Your next appointment:   ?6 month(s) ? ?The format for your next appointment:   ?In Person ? ?Provider:   ?Janina Mayo, MD   ? ?Other Instructions ?If you feel rapid heart rates, you can use a pulse ox to detect your heart rhythm. If your heart rhythm is greater than 120 bpm while resting and you don't feel well, you can go to the ED ?

## 2021-07-09 ENCOUNTER — Telehealth: Payer: Self-pay | Admitting: *Deleted

## 2021-07-09 DIAGNOSIS — M069 Rheumatoid arthritis, unspecified: Secondary | ICD-10-CM | POA: Diagnosis not present

## 2021-07-09 DIAGNOSIS — M109 Gout, unspecified: Secondary | ICD-10-CM | POA: Diagnosis not present

## 2021-07-09 DIAGNOSIS — N1831 Chronic kidney disease, stage 3a: Secondary | ICD-10-CM | POA: Diagnosis not present

## 2021-07-09 DIAGNOSIS — I129 Hypertensive chronic kidney disease with stage 1 through stage 4 chronic kidney disease, or unspecified chronic kidney disease: Secondary | ICD-10-CM | POA: Diagnosis not present

## 2021-07-09 DIAGNOSIS — R531 Weakness: Secondary | ICD-10-CM | POA: Diagnosis not present

## 2021-07-09 DIAGNOSIS — M0609 Rheumatoid arthritis without rheumatoid factor, multiple sites: Secondary | ICD-10-CM | POA: Diagnosis not present

## 2021-07-09 DIAGNOSIS — C439 Malignant melanoma of skin, unspecified: Secondary | ICD-10-CM | POA: Diagnosis not present

## 2021-07-09 DIAGNOSIS — Z79891 Long term (current) use of opiate analgesic: Secondary | ICD-10-CM | POA: Diagnosis not present

## 2021-07-09 DIAGNOSIS — R0689 Other abnormalities of breathing: Secondary | ICD-10-CM | POA: Diagnosis not present

## 2021-07-09 DIAGNOSIS — E871 Hypo-osmolality and hyponatremia: Secondary | ICD-10-CM | POA: Diagnosis not present

## 2021-07-09 DIAGNOSIS — R059 Cough, unspecified: Secondary | ICD-10-CM | POA: Diagnosis not present

## 2021-07-09 DIAGNOSIS — F419 Anxiety disorder, unspecified: Secondary | ICD-10-CM | POA: Diagnosis not present

## 2021-07-09 DIAGNOSIS — A419 Sepsis, unspecified organism: Secondary | ICD-10-CM | POA: Diagnosis not present

## 2021-07-09 DIAGNOSIS — Z66 Do not resuscitate: Secondary | ICD-10-CM | POA: Diagnosis not present

## 2021-07-09 DIAGNOSIS — R000 Tachycardia, unspecified: Secondary | ICD-10-CM | POA: Diagnosis not present

## 2021-07-09 DIAGNOSIS — W19XXXA Unspecified fall, initial encounter: Secondary | ICD-10-CM | POA: Diagnosis not present

## 2021-07-09 DIAGNOSIS — G893 Neoplasm related pain (acute) (chronic): Secondary | ICD-10-CM | POA: Diagnosis not present

## 2021-07-09 DIAGNOSIS — Z515 Encounter for palliative care: Secondary | ICD-10-CM | POA: Diagnosis not present

## 2021-07-09 DIAGNOSIS — R1310 Dysphagia, unspecified: Secondary | ICD-10-CM | POA: Diagnosis not present

## 2021-07-09 DIAGNOSIS — R410 Disorientation, unspecified: Secondary | ICD-10-CM | POA: Diagnosis not present

## 2021-07-09 DIAGNOSIS — I2510 Atherosclerotic heart disease of native coronary artery without angina pectoris: Secondary | ICD-10-CM | POA: Diagnosis not present

## 2021-07-09 DIAGNOSIS — I4891 Unspecified atrial fibrillation: Secondary | ICD-10-CM | POA: Diagnosis not present

## 2021-07-09 DIAGNOSIS — T17320D Food in larynx causing asphyxiation, subsequent encounter: Secondary | ICD-10-CM | POA: Diagnosis not present

## 2021-07-09 DIAGNOSIS — R9082 White matter disease, unspecified: Secondary | ICD-10-CM | POA: Diagnosis not present

## 2021-07-09 DIAGNOSIS — R4182 Altered mental status, unspecified: Secondary | ICD-10-CM | POA: Diagnosis not present

## 2021-07-09 DIAGNOSIS — E119 Type 2 diabetes mellitus without complications: Secondary | ICD-10-CM | POA: Diagnosis not present

## 2021-07-09 DIAGNOSIS — R0600 Dyspnea, unspecified: Secondary | ICD-10-CM | POA: Diagnosis not present

## 2021-07-09 DIAGNOSIS — G319 Degenerative disease of nervous system, unspecified: Secondary | ICD-10-CM | POA: Diagnosis not present

## 2021-07-09 DIAGNOSIS — R4781 Slurred speech: Secondary | ICD-10-CM | POA: Diagnosis not present

## 2021-07-09 DIAGNOSIS — R52 Pain, unspecified: Secondary | ICD-10-CM | POA: Diagnosis not present

## 2021-07-09 DIAGNOSIS — Z20822 Contact with and (suspected) exposure to covid-19: Secondary | ICD-10-CM | POA: Diagnosis not present

## 2021-07-09 DIAGNOSIS — C9000 Multiple myeloma not having achieved remission: Secondary | ICD-10-CM | POA: Diagnosis not present

## 2021-07-09 DIAGNOSIS — K5900 Constipation, unspecified: Secondary | ICD-10-CM | POA: Diagnosis not present

## 2021-07-09 DIAGNOSIS — R0902 Hypoxemia: Secondary | ICD-10-CM | POA: Diagnosis not present

## 2021-07-09 DIAGNOSIS — M1990 Unspecified osteoarthritis, unspecified site: Secondary | ICD-10-CM | POA: Diagnosis not present

## 2021-07-09 DIAGNOSIS — N200 Calculus of kidney: Secondary | ICD-10-CM | POA: Diagnosis not present

## 2021-07-09 DIAGNOSIS — R296 Repeated falls: Secondary | ICD-10-CM | POA: Diagnosis not present

## 2021-07-09 DIAGNOSIS — R1312 Dysphagia, oropharyngeal phase: Secondary | ICD-10-CM | POA: Diagnosis not present

## 2021-07-09 DIAGNOSIS — R451 Restlessness and agitation: Secondary | ICD-10-CM | POA: Diagnosis not present

## 2021-07-09 DIAGNOSIS — Z7409 Other reduced mobility: Secondary | ICD-10-CM | POA: Diagnosis not present

## 2021-07-09 DIAGNOSIS — R6889 Other general symptoms and signs: Secondary | ICD-10-CM | POA: Diagnosis not present

## 2021-07-09 DIAGNOSIS — E1122 Type 2 diabetes mellitus with diabetic chronic kidney disease: Secondary | ICD-10-CM | POA: Diagnosis not present

## 2021-07-09 DIAGNOSIS — R509 Fever, unspecified: Secondary | ICD-10-CM | POA: Diagnosis not present

## 2021-07-09 DIAGNOSIS — R918 Other nonspecific abnormal finding of lung field: Secondary | ICD-10-CM | POA: Diagnosis not present

## 2021-07-09 DIAGNOSIS — J189 Pneumonia, unspecified organism: Secondary | ICD-10-CM | POA: Diagnosis not present

## 2021-07-09 DIAGNOSIS — Z7189 Other specified counseling: Secondary | ICD-10-CM | POA: Diagnosis not present

## 2021-07-09 DIAGNOSIS — R404 Transient alteration of awareness: Secondary | ICD-10-CM | POA: Diagnosis not present

## 2021-07-09 DIAGNOSIS — R29818 Other symptoms and signs involving the nervous system: Secondary | ICD-10-CM | POA: Diagnosis not present

## 2021-07-09 DIAGNOSIS — K8020 Calculus of gallbladder without cholecystitis without obstruction: Secondary | ICD-10-CM | POA: Diagnosis not present

## 2021-07-09 DIAGNOSIS — D849 Immunodeficiency, unspecified: Secondary | ICD-10-CM | POA: Diagnosis not present

## 2021-07-09 DIAGNOSIS — I10 Essential (primary) hypertension: Secondary | ICD-10-CM | POA: Diagnosis not present

## 2021-07-09 DIAGNOSIS — E785 Hyperlipidemia, unspecified: Secondary | ICD-10-CM | POA: Diagnosis not present

## 2021-07-09 DIAGNOSIS — I6529 Occlusion and stenosis of unspecified carotid artery: Secondary | ICD-10-CM | POA: Diagnosis not present

## 2021-07-09 DIAGNOSIS — N3289 Other specified disorders of bladder: Secondary | ICD-10-CM | POA: Diagnosis not present

## 2021-07-09 DIAGNOSIS — E7800 Pure hypercholesterolemia, unspecified: Secondary | ICD-10-CM | POA: Diagnosis not present

## 2021-07-09 DIAGNOSIS — J9811 Atelectasis: Secondary | ICD-10-CM | POA: Diagnosis not present

## 2021-07-09 DIAGNOSIS — R112 Nausea with vomiting, unspecified: Secondary | ICD-10-CM | POA: Diagnosis not present

## 2021-07-09 DIAGNOSIS — J90 Pleural effusion, not elsewhere classified: Secondary | ICD-10-CM | POA: Diagnosis not present

## 2021-07-09 DIAGNOSIS — D539 Nutritional anemia, unspecified: Secondary | ICD-10-CM | POA: Diagnosis not present

## 2021-07-09 DIAGNOSIS — N281 Cyst of kidney, acquired: Secondary | ICD-10-CM | POA: Diagnosis not present

## 2021-07-09 DIAGNOSIS — C649 Malignant neoplasm of unspecified kidney, except renal pelvis: Secondary | ICD-10-CM | POA: Diagnosis not present

## 2021-07-09 DIAGNOSIS — N189 Chronic kidney disease, unspecified: Secondary | ICD-10-CM | POA: Diagnosis not present

## 2021-07-09 DIAGNOSIS — G9341 Metabolic encephalopathy: Secondary | ICD-10-CM | POA: Diagnosis not present

## 2021-07-09 DIAGNOSIS — S7002XA Contusion of left hip, initial encounter: Secondary | ICD-10-CM | POA: Diagnosis not present

## 2021-07-09 DIAGNOSIS — I6782 Cerebral ischemia: Secondary | ICD-10-CM | POA: Diagnosis not present

## 2021-07-09 NOTE — Telephone Encounter (Signed)
Wife called to let Dr Alen Blew know that Devin [name] fell this morning and went to the ED. Is OK. Went to ED earlier in the week due to dehydration. Will see Dr Alen Blew as scheduled on 5/3. ?

## 2021-07-12 ENCOUNTER — Telehealth: Payer: Self-pay

## 2021-07-12 NOTE — Telephone Encounter (Signed)
Devin [name] called and pt has had trouble with incontinence for the last 4 days. It is keeping him up at night.  She is asking if this could be due to his age or cancer?  And what is the next step for him? ? ?Please advise ? ?

## 2021-07-13 NOTE — Telephone Encounter (Signed)
Spoke with pt's wife, [name], to relay message for her to contact his Urologist regarding his incontinence.  Pt was admitted to [facility] last night.  She said he was having difficulty swallowing and she thought he was having a stroke.  He did not have a stroke but he is having a swallowing test today. ?

## 2021-07-15 ENCOUNTER — Telehealth: Payer: Self-pay | Admitting: *Deleted

## 2021-07-15 DIAGNOSIS — I10 Essential (primary) hypertension: Secondary | ICD-10-CM | POA: Diagnosis not present

## 2021-07-15 DIAGNOSIS — E7800 Pure hypercholesterolemia, unspecified: Secondary | ICD-10-CM | POA: Diagnosis not present

## 2021-07-15 DIAGNOSIS — I129 Hypertensive chronic kidney disease with stage 1 through stage 4 chronic kidney disease, or unspecified chronic kidney disease: Secondary | ICD-10-CM | POA: Diagnosis not present

## 2021-07-15 DIAGNOSIS — M0609 Rheumatoid arthritis without rheumatoid factor, multiple sites: Secondary | ICD-10-CM | POA: Diagnosis not present

## 2021-07-15 DIAGNOSIS — E119 Type 2 diabetes mellitus without complications: Secondary | ICD-10-CM | POA: Diagnosis not present

## 2021-07-15 DIAGNOSIS — N1831 Chronic kidney disease, stage 3a: Secondary | ICD-10-CM | POA: Diagnosis not present

## 2021-07-15 DIAGNOSIS — I2510 Atherosclerotic heart disease of native coronary artery without angina pectoris: Secondary | ICD-10-CM | POA: Diagnosis not present

## 2021-07-15 NOTE — Telephone Encounter (Signed)
Spoke with wife. Dr Alen Blew talked with Nemiah Commander, NP at [facility]. Dr Alen Blew is in agreement with Hospice referral and stopping treatment.  ?

## 2021-07-15 NOTE — Telephone Encounter (Signed)
Merrie Roof, NP [phone]) from [facility] states Mr Devin [name] is currently admitted. They have had discussions with family regarding Hospice. Wife states falling more at home, needing 24 hour support. Daughter is wanting input from Dr Alen Blew as he has been following Mr Devin [name] for several years. Please advise. Notes available in Care Everywhere. ?

## 2021-07-19 DEATH — deceased

## 2021-07-21 ENCOUNTER — Inpatient Hospital Stay: Payer: Medicare HMO | Admitting: Oncology

## 2021-07-21 ENCOUNTER — Inpatient Hospital Stay: Payer: Medicare HMO

## 2021-09-06 ENCOUNTER — Ambulatory Visit: Payer: Medicare HMO | Admitting: Internal Medicine

## 2021-10-11 ENCOUNTER — Other Ambulatory Visit: Payer: Self-pay

## 2021-11-27 IMAGING — CT CT BIOPSY
1 of 2 series · 15 of 28 positions shown, 19 images · non-contrast
Comparison: none

CLINICAL DATA: Multiple myeloma with biopsy-proven plasmacytoma of
the L5 vertebral body. The patient requires bone marrow biopsy for
further workup of multiple myeloma.

[Series 2: i-spiral 5.0 br40 · axial · 0.95mm/px · z∈[-113,-29]mm · 15 of 28 slices shown, 19 images]
[im 2/28  mediastinal]
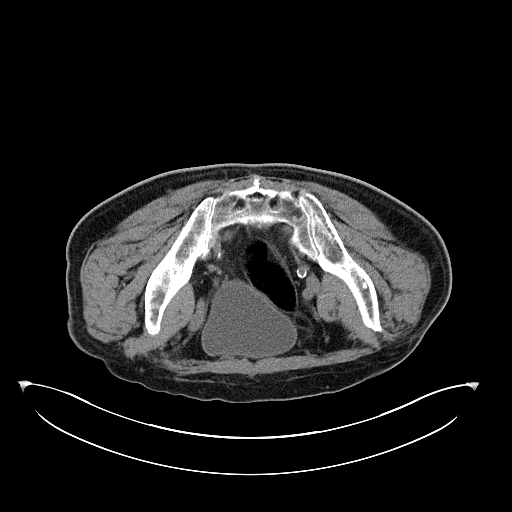
[im 2/28  lung]
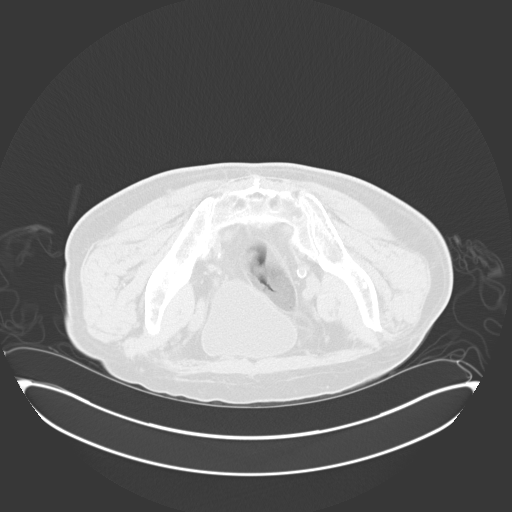
[im 4/28  lung]
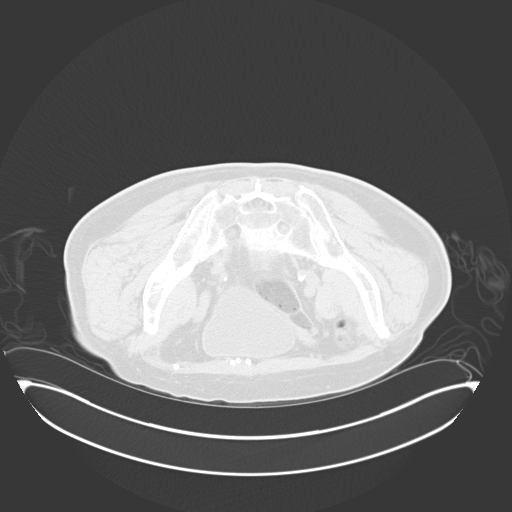
[im 5/28  lung]
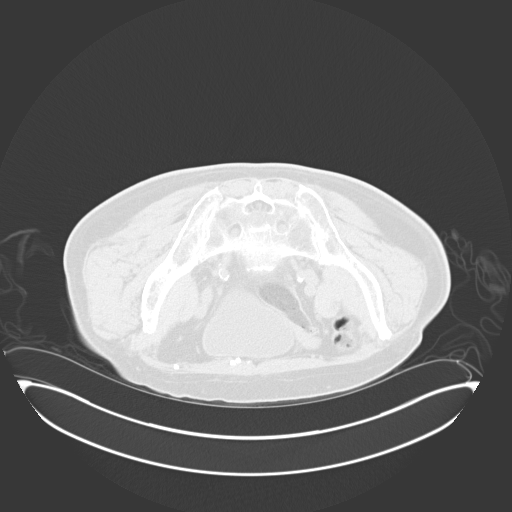
[im 7/28  lung]
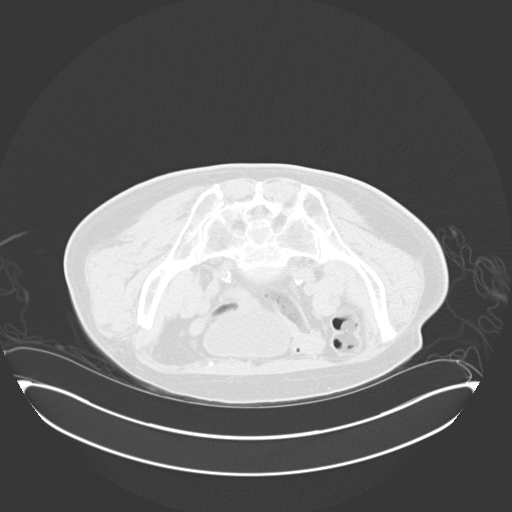
[im 9/28  mediastinal]
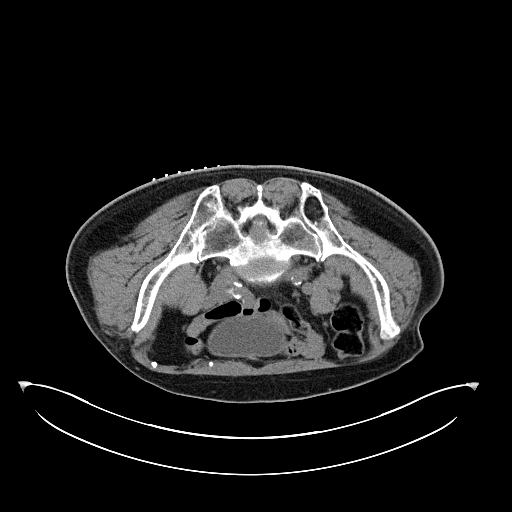
[im 9/28  lung]
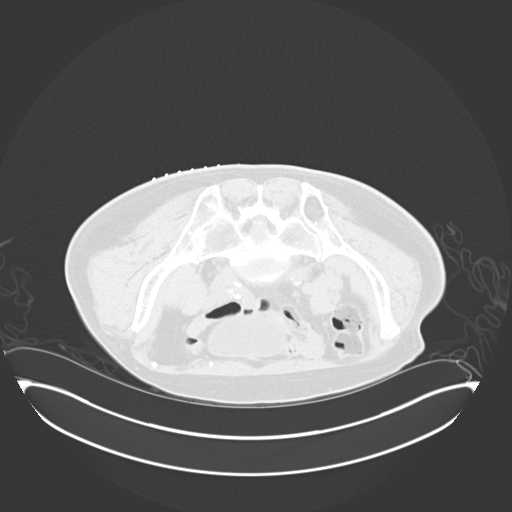
[im 10/28  lung]
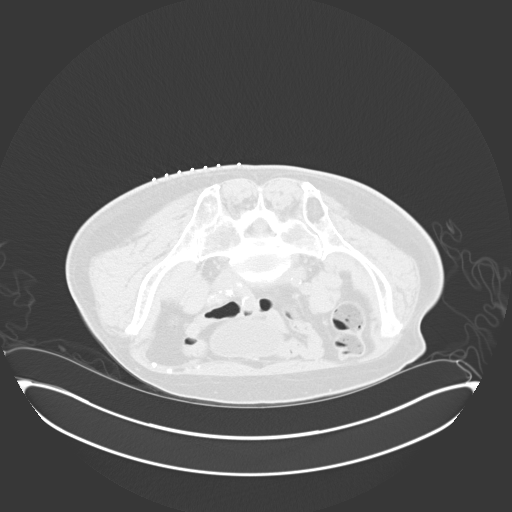
[im 13/28  lung]
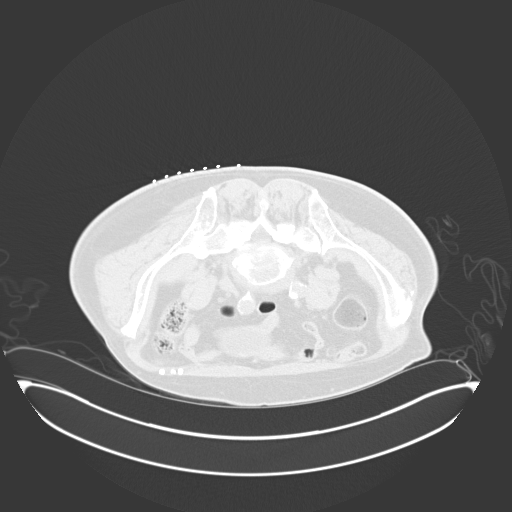
[im 14/28  lung]
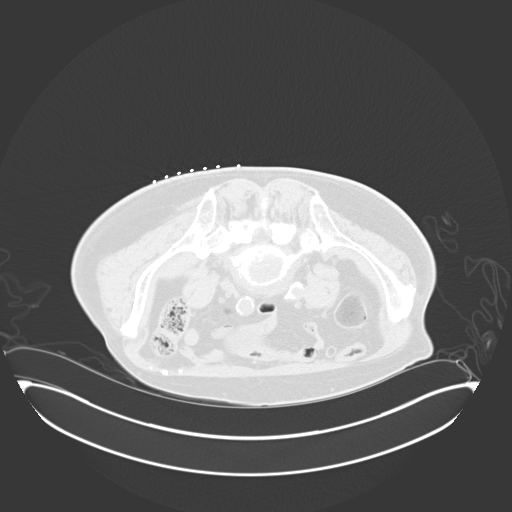
[im 15/28  mediastinal]
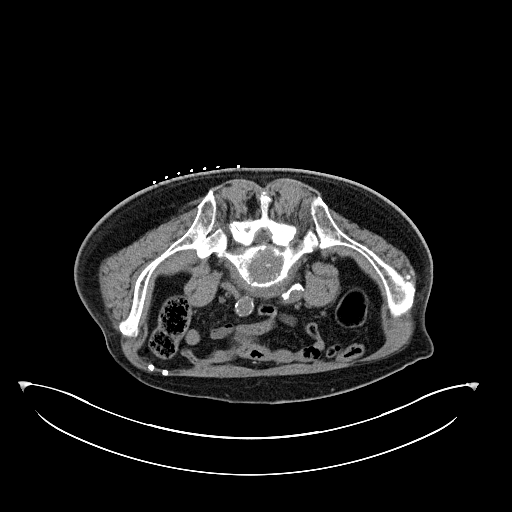
[im 15/28  lung]
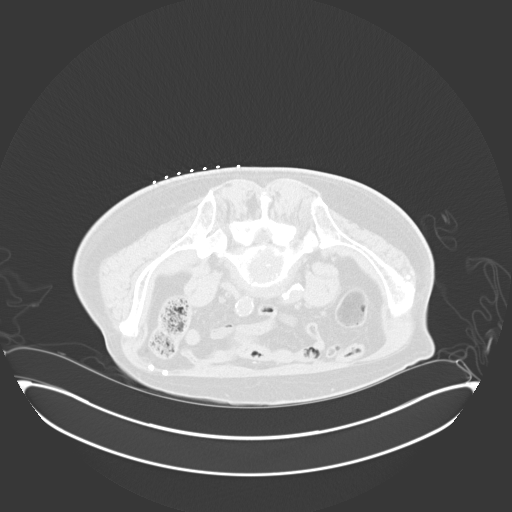
[im 18/28  lung]
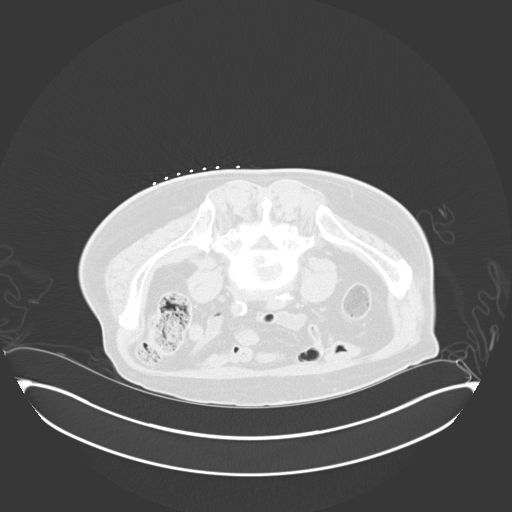
[im 19/28  lung]
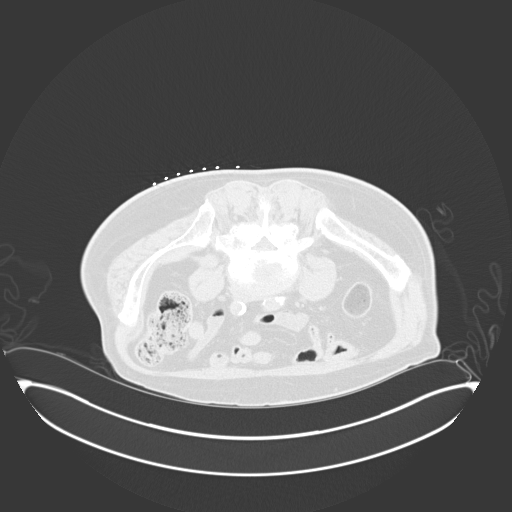
[im 21/28  lung]
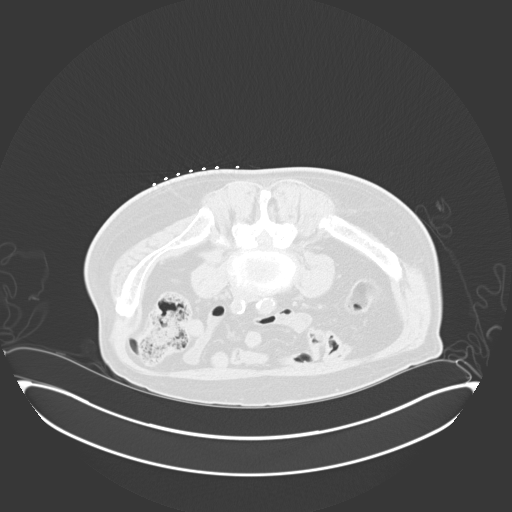
[im 23/28  mediastinal]
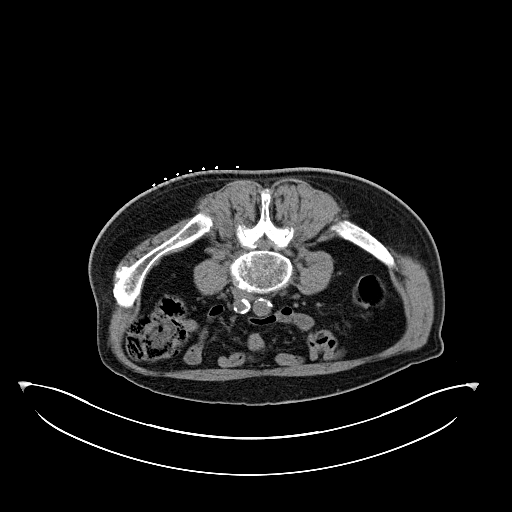
[im 23/28  lung]
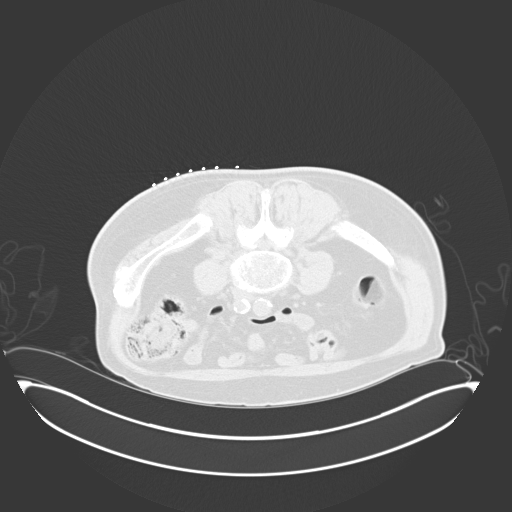
[im 24/28  lung]
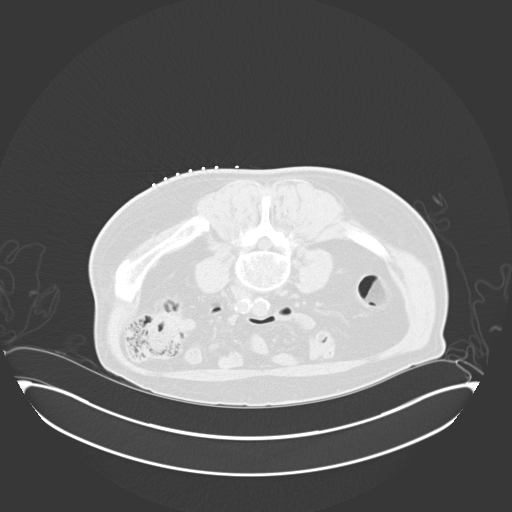
[im 26/28  lung]
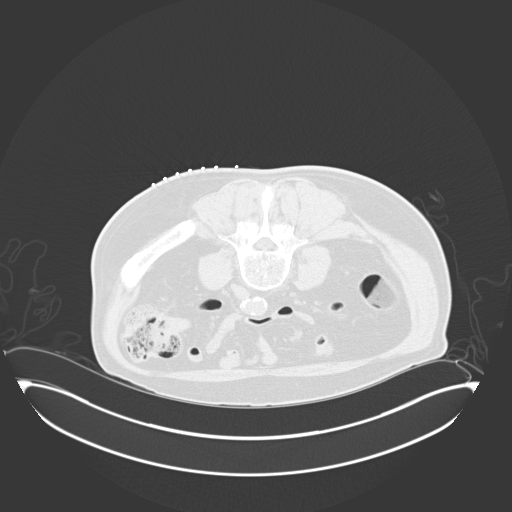

[15 of 28 positions shown; findings below may reference images not displayed]

EXAM:
CT GUIDED BONE MARROW ASPIRATION AND BIOPSY

ANESTHESIA/SEDATION:
Versed 4.0 mg IV, Fentanyl 100 mcg IV

Total Moderate Sedation Time:   20 minutes.

The patient's level of consciousness and physiologic status were
continuously monitored during the procedure by Radiology nursing.

PROCEDURE:
The procedure risks, benefits, and alternatives were explained to
the patient. Questions regarding the procedure were encouraged and
answered. The patient understands and consents to the procedure. A
time out was performed prior to initiating the procedure.

The right gluteal region was prepped with chlorhexidine. Sterile
gown and sterile gloves were used for the procedure. Local
anesthesia was provided with 1% Lidocaine.

Under CT guidance, an 11 gauge On Control bone cutting needle was
advanced from a posterior approach into the right iliac bone. Needle
positioning was confirmed with CT. Initial non heparinized and
heparinized aspirate samples were obtained of bone marrow. Core
biopsy was performed via the On Control drill needle.

COMPLICATIONS:
None
FINDINGS: Inspection of initial aspirate did reveal visible particles. Intact
core biopsy sample was obtained.
IMPRESSION: CT guided bone marrow biopsy of right posterior iliac bone with both
aspirate and core samples obtained.

## 2021-12-25 IMAGING — CR DG THORACIC SPINE 2V
3 series · 3 of 3 positions shown · non-contrast
Comparison: Chest CT 05/28/2018.

CLINICAL DATA: Fall at chemo on [REDACTED] with thoracic back pain.

EXAM:
THORACIC SPINE 2 VIEWS

[t thoracic spine ap]
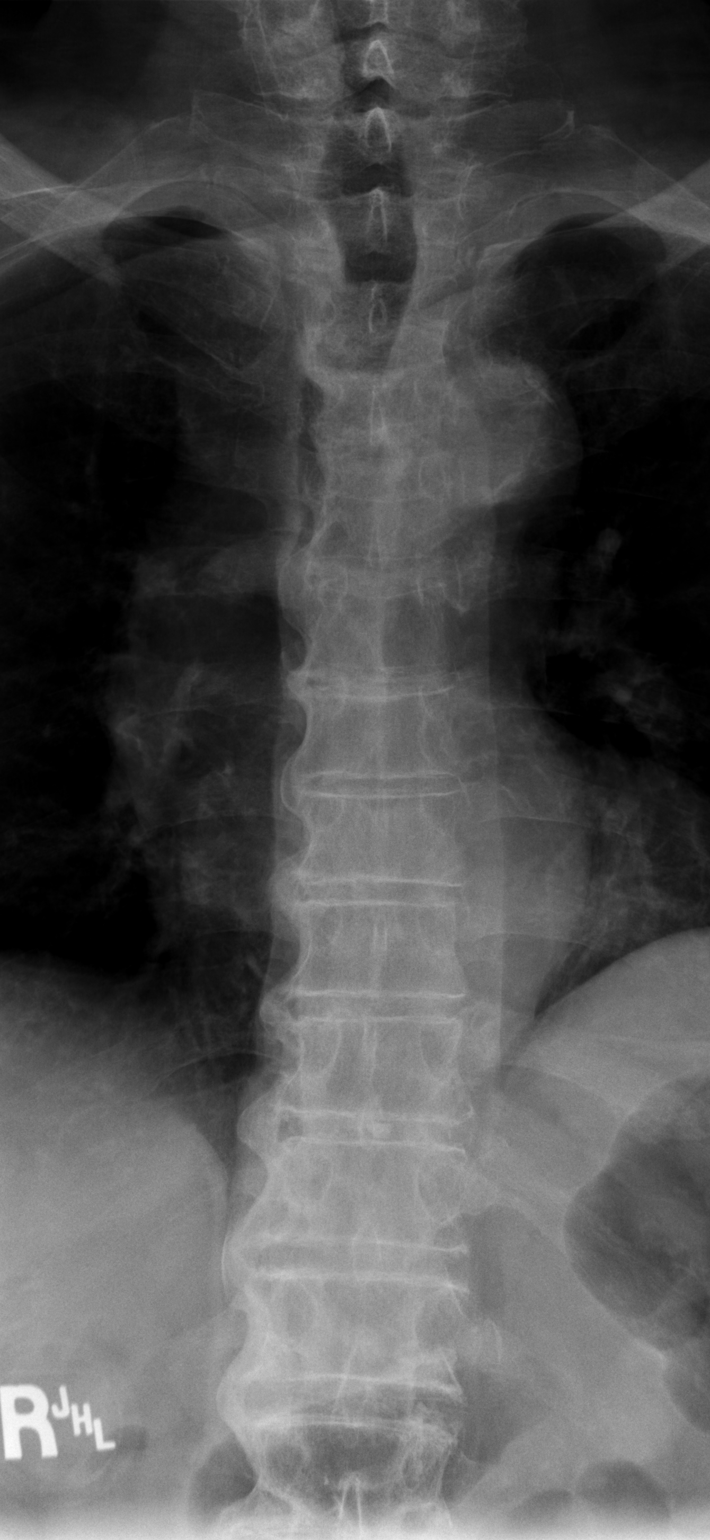

[t thoracic breathing lat]
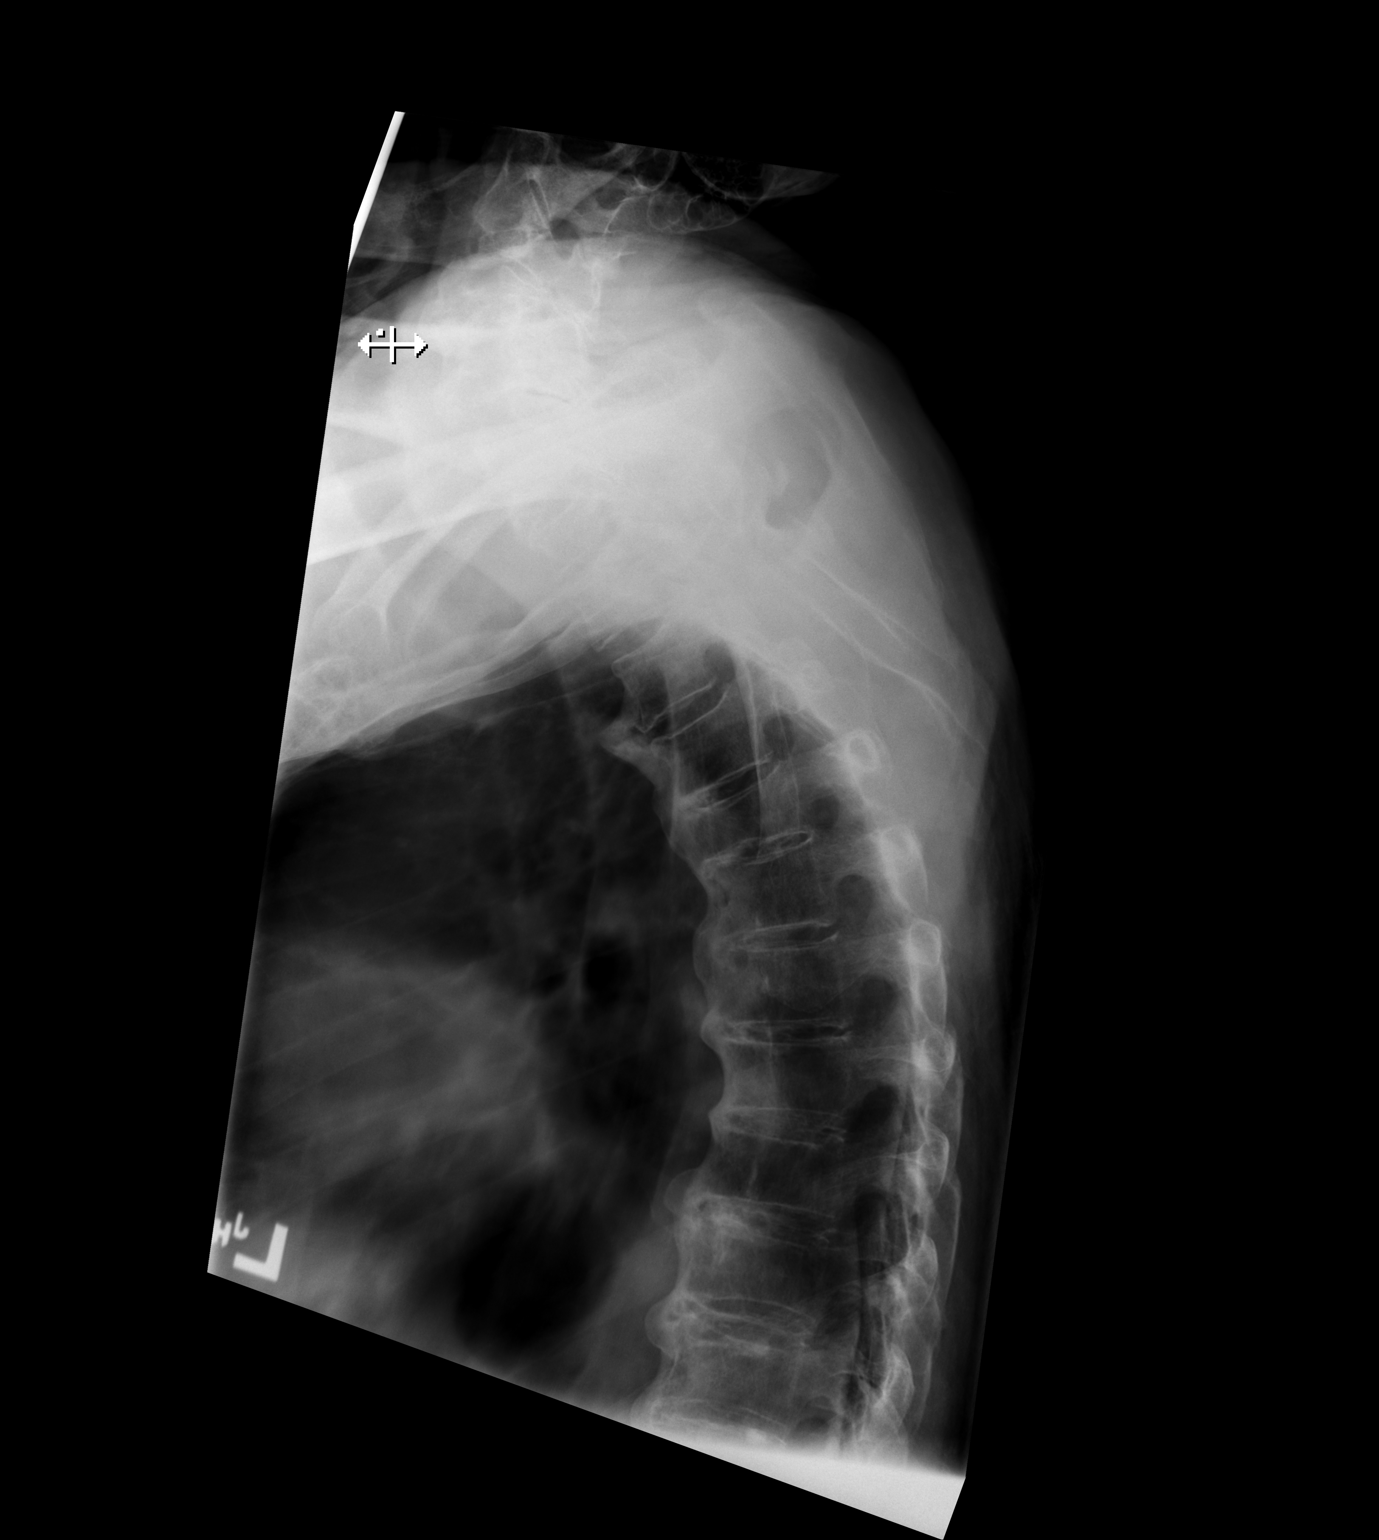

[t thoracic swimmers]
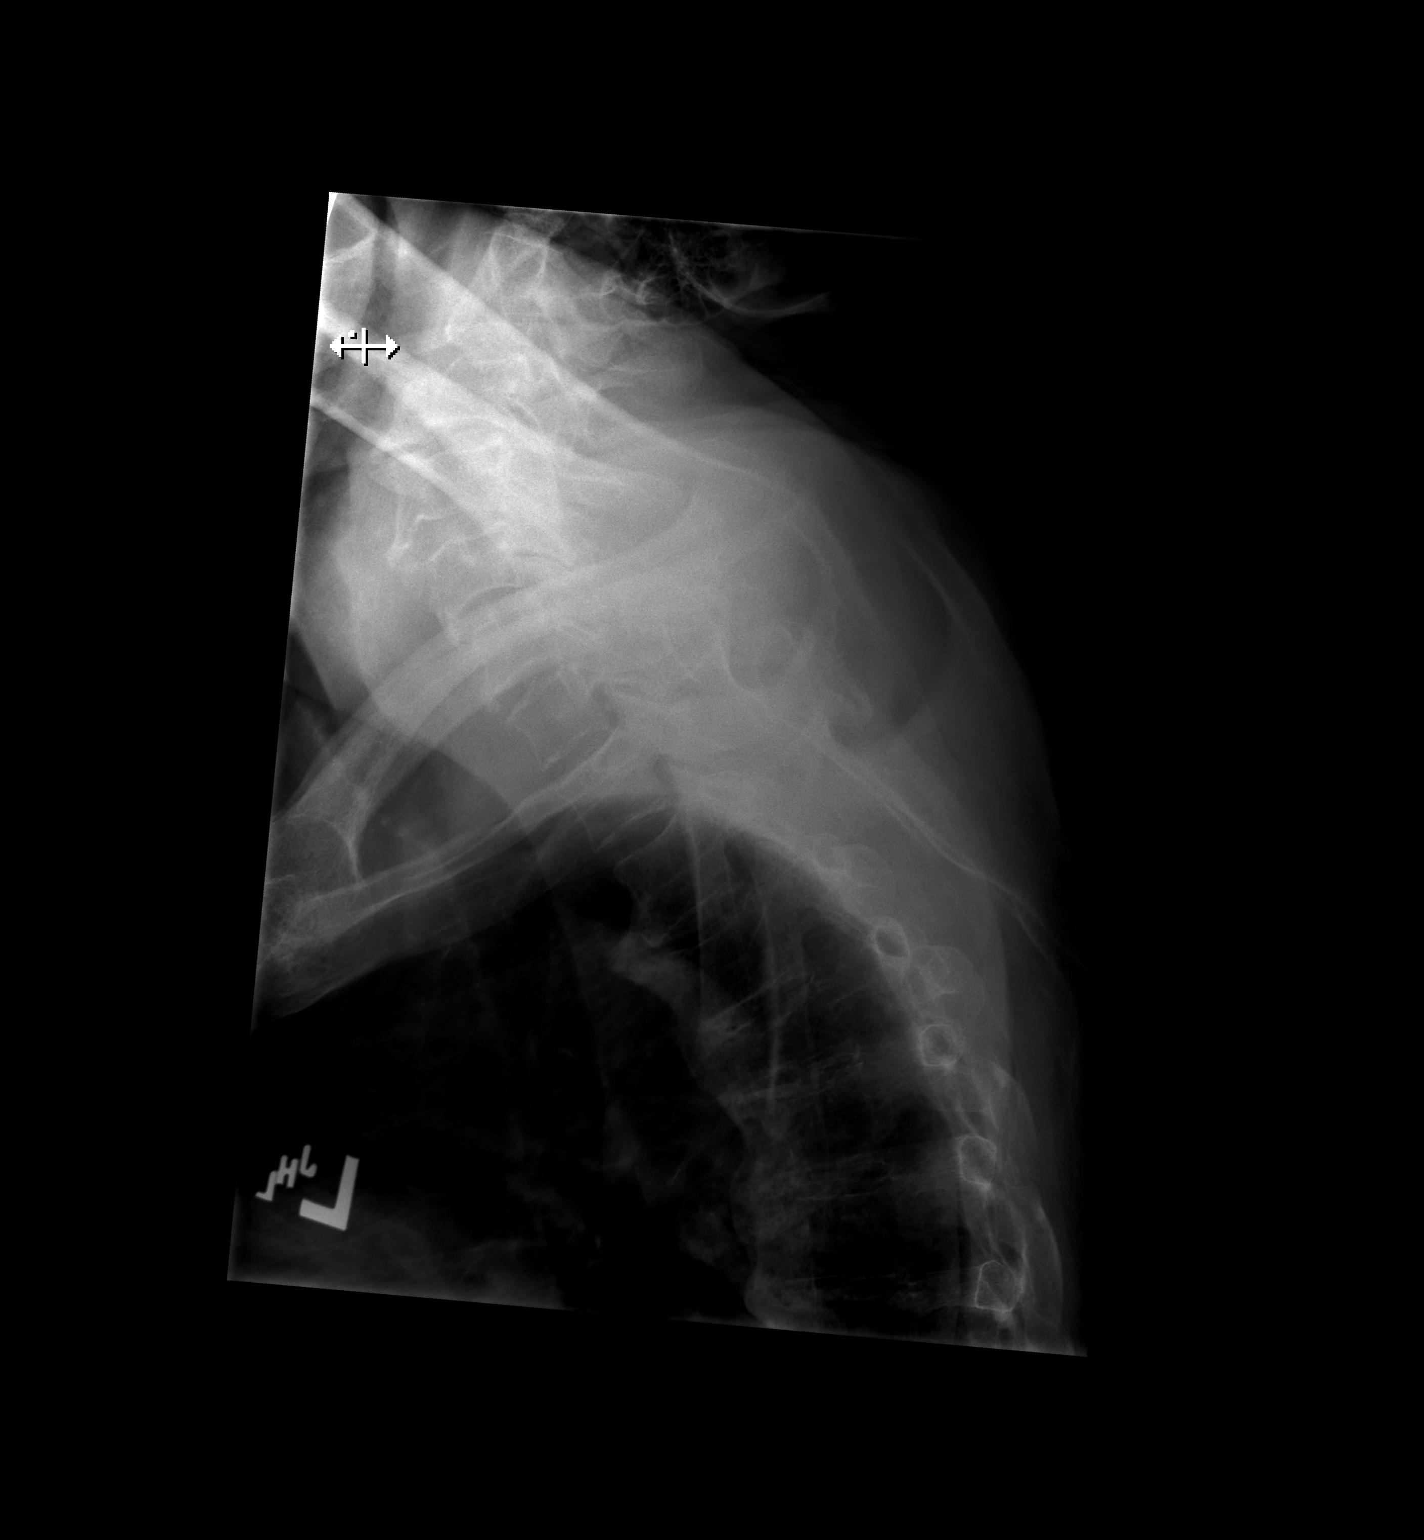

[3 of 3 positions shown; findings below may reference images not displayed]

FINDINGS: Chronic T4 compression fracture is unchanged from prior chest CT. No
evidence of new or compression deformity or acute fracture. Flowing
anterior osteophytes throughout the thoracic spine. Mild multilevel
disc space narrowing. No paravertebral soft tissue abnormality to
suggest fracture. No radiographic evidence of focal lesion.
IMPRESSION: 1. No acute fracture or subluxation of the thoracic spine.
2. Chronic T4 compression fracture. Multilevel degenerative disc
disease and DISH.

## 2022-04-20 ENCOUNTER — Other Ambulatory Visit: Payer: Self-pay

## 2022-09-05 ENCOUNTER — Other Ambulatory Visit: Payer: Self-pay

## 2023-02-08 NOTE — Telephone Encounter (Signed)
Telephone call
